# Patient Record
Sex: Female | Born: 1970 | Race: Black or African American | Hispanic: No | Marital: Single | Smoking: Current every day smoker
Health system: Southern US, Community
[De-identification: ages and names within clinical notes are randomized; demographics above are authoritative.]

## PROBLEM LIST (undated history)

## (undated) ENCOUNTER — Ambulatory Visit: Admission: EM | Payer: BLUE CROSS/BLUE SHIELD | Source: Home / Self Care

## (undated) DIAGNOSIS — D649 Anemia, unspecified: Secondary | ICD-10-CM

## (undated) DIAGNOSIS — G43909 Migraine, unspecified, not intractable, without status migrainosus: Secondary | ICD-10-CM

## (undated) DIAGNOSIS — Z720 Tobacco use: Secondary | ICD-10-CM

## (undated) DIAGNOSIS — Z8619 Personal history of other infectious and parasitic diseases: Secondary | ICD-10-CM

## (undated) DIAGNOSIS — Z5189 Encounter for other specified aftercare: Secondary | ICD-10-CM

## (undated) DIAGNOSIS — F329 Major depressive disorder, single episode, unspecified: Secondary | ICD-10-CM

## (undated) DIAGNOSIS — K635 Polyp of colon: Secondary | ICD-10-CM

## (undated) DIAGNOSIS — E559 Vitamin D deficiency, unspecified: Secondary | ICD-10-CM

## (undated) DIAGNOSIS — F32A Depression, unspecified: Secondary | ICD-10-CM

## (undated) DIAGNOSIS — M5430 Sciatica, unspecified side: Secondary | ICD-10-CM

## (undated) DIAGNOSIS — R7989 Other specified abnormal findings of blood chemistry: Secondary | ICD-10-CM

## (undated) DIAGNOSIS — R911 Solitary pulmonary nodule: Secondary | ICD-10-CM

## (undated) DIAGNOSIS — I10 Essential (primary) hypertension: Secondary | ICD-10-CM

## (undated) DIAGNOSIS — E785 Hyperlipidemia, unspecified: Secondary | ICD-10-CM

## (undated) DIAGNOSIS — I639 Cerebral infarction, unspecified: Secondary | ICD-10-CM

## (undated) HISTORY — DX: Vitamin D deficiency, unspecified: E55.9

## (undated) HISTORY — DX: Other specified abnormal findings of blood chemistry: R79.89

## (undated) HISTORY — DX: Cerebral infarction, unspecified: I63.9

## (undated) HISTORY — DX: Solitary pulmonary nodule: R91.1

## (undated) HISTORY — PX: FOOT SURGERY: SHX648

## (undated) HISTORY — DX: Polyp of colon: K63.5

## (undated) HISTORY — DX: Encounter for other specified aftercare: Z51.89

## (undated) HISTORY — PX: ABDOMINAL HYSTERECTOMY: SHX81

## (undated) HISTORY — DX: Anemia, unspecified: D64.9

## (undated) HISTORY — PX: OTHER SURGICAL HISTORY: SHX169

## (undated) HISTORY — DX: Migraine, unspecified, not intractable, without status migrainosus: G43.909

## (undated) HISTORY — DX: Tobacco use: Z72.0

## (undated) HISTORY — PX: ARM NEUROPLASTY: SHX1184

## (undated) HISTORY — DX: Hyperlipidemia, unspecified: E78.5

---

## 1997-07-29 ENCOUNTER — Emergency Department (HOSPITAL_COMMUNITY): Admission: EM | Admit: 1997-07-29 | Discharge: 1997-07-29 | Payer: Self-pay | Admitting: Emergency Medicine

## 1997-09-08 ENCOUNTER — Emergency Department (HOSPITAL_COMMUNITY): Admission: EM | Admit: 1997-09-08 | Discharge: 1997-09-08 | Payer: Self-pay | Admitting: Emergency Medicine

## 1997-09-30 ENCOUNTER — Emergency Department (HOSPITAL_COMMUNITY): Admission: EM | Admit: 1997-09-30 | Discharge: 1997-09-30 | Payer: Self-pay | Admitting: Emergency Medicine

## 1998-01-01 ENCOUNTER — Emergency Department (HOSPITAL_COMMUNITY): Admission: EM | Admit: 1998-01-01 | Discharge: 1998-01-01 | Payer: Self-pay | Admitting: *Deleted

## 1998-06-11 ENCOUNTER — Emergency Department (HOSPITAL_COMMUNITY): Admission: EM | Admit: 1998-06-11 | Discharge: 1998-06-11 | Payer: Self-pay | Admitting: *Deleted

## 1998-06-18 ENCOUNTER — Emergency Department (HOSPITAL_COMMUNITY): Admission: EM | Admit: 1998-06-18 | Discharge: 1998-06-18 | Payer: Self-pay | Admitting: Emergency Medicine

## 1999-02-10 ENCOUNTER — Emergency Department (HOSPITAL_COMMUNITY): Admission: EM | Admit: 1999-02-10 | Discharge: 1999-02-10 | Payer: Self-pay | Admitting: Emergency Medicine

## 1999-02-10 ENCOUNTER — Encounter: Payer: Self-pay | Admitting: Emergency Medicine

## 1999-04-20 ENCOUNTER — Inpatient Hospital Stay (HOSPITAL_COMMUNITY): Admission: AD | Admit: 1999-04-20 | Discharge: 1999-04-20 | Payer: Self-pay | Admitting: *Deleted

## 1999-04-20 ENCOUNTER — Emergency Department (HOSPITAL_COMMUNITY): Admission: EM | Admit: 1999-04-20 | Discharge: 1999-04-20 | Payer: Self-pay | Admitting: *Deleted

## 1999-04-22 ENCOUNTER — Inpatient Hospital Stay (HOSPITAL_COMMUNITY): Admission: AD | Admit: 1999-04-22 | Discharge: 1999-04-22 | Payer: Self-pay | Admitting: Obstetrics & Gynecology

## 1999-07-08 ENCOUNTER — Inpatient Hospital Stay (HOSPITAL_COMMUNITY): Admission: AD | Admit: 1999-07-08 | Discharge: 1999-07-12 | Payer: Self-pay | Admitting: Obstetrics & Gynecology

## 1999-07-08 ENCOUNTER — Encounter: Payer: Self-pay | Admitting: Obstetrics & Gynecology

## 1999-10-01 ENCOUNTER — Emergency Department (HOSPITAL_COMMUNITY): Admission: EM | Admit: 1999-10-01 | Discharge: 1999-10-01 | Payer: Self-pay | Admitting: Emergency Medicine

## 2000-04-03 ENCOUNTER — Emergency Department (HOSPITAL_COMMUNITY): Admission: EM | Admit: 2000-04-03 | Discharge: 2000-04-03 | Payer: Self-pay | Admitting: Emergency Medicine

## 2000-05-22 ENCOUNTER — Emergency Department (HOSPITAL_COMMUNITY): Admission: EM | Admit: 2000-05-22 | Discharge: 2000-05-22 | Payer: Self-pay | Admitting: Emergency Medicine

## 2000-09-02 ENCOUNTER — Emergency Department (HOSPITAL_COMMUNITY): Admission: EM | Admit: 2000-09-02 | Discharge: 2000-09-02 | Payer: Self-pay

## 2001-03-12 ENCOUNTER — Encounter: Admission: RE | Admit: 2001-03-12 | Discharge: 2001-03-12 | Payer: Self-pay

## 2001-04-05 ENCOUNTER — Encounter: Admission: RE | Admit: 2001-04-05 | Discharge: 2001-04-05 | Payer: Self-pay | Admitting: *Deleted

## 2001-04-24 ENCOUNTER — Ambulatory Visit (HOSPITAL_COMMUNITY): Admission: RE | Admit: 2001-04-24 | Discharge: 2001-04-24 | Payer: Self-pay | Admitting: *Deleted

## 2001-05-31 ENCOUNTER — Encounter: Admission: RE | Admit: 2001-05-31 | Discharge: 2001-05-31 | Payer: Self-pay | Admitting: *Deleted

## 2001-07-02 ENCOUNTER — Encounter (INDEPENDENT_AMBULATORY_CARE_PROVIDER_SITE_OTHER): Payer: Self-pay | Admitting: Specialist

## 2001-07-02 ENCOUNTER — Ambulatory Visit: Admission: RE | Admit: 2001-07-02 | Discharge: 2001-07-02 | Payer: Self-pay | Admitting: Obstetrics and Gynecology

## 2001-07-03 ENCOUNTER — Inpatient Hospital Stay (HOSPITAL_COMMUNITY): Admission: AD | Admit: 2001-07-03 | Discharge: 2001-07-03 | Payer: Self-pay | Admitting: *Deleted

## 2001-07-04 ENCOUNTER — Inpatient Hospital Stay (HOSPITAL_COMMUNITY): Admission: AD | Admit: 2001-07-04 | Discharge: 2001-07-04 | Payer: Self-pay | Admitting: Obstetrics and Gynecology

## 2001-07-10 ENCOUNTER — Inpatient Hospital Stay (HOSPITAL_COMMUNITY): Admission: AD | Admit: 2001-07-10 | Discharge: 2001-07-10 | Payer: Self-pay | Admitting: *Deleted

## 2001-07-17 ENCOUNTER — Encounter: Admission: RE | Admit: 2001-07-17 | Discharge: 2001-07-17 | Payer: Self-pay | Admitting: *Deleted

## 2001-09-06 ENCOUNTER — Emergency Department (HOSPITAL_COMMUNITY): Admission: EM | Admit: 2001-09-06 | Discharge: 2001-09-07 | Payer: Self-pay | Admitting: Emergency Medicine

## 2002-03-13 ENCOUNTER — Encounter: Admission: RE | Admit: 2002-03-13 | Discharge: 2002-03-13 | Payer: Self-pay | Admitting: Internal Medicine

## 2002-03-20 ENCOUNTER — Emergency Department (HOSPITAL_COMMUNITY): Admission: EM | Admit: 2002-03-20 | Discharge: 2002-03-20 | Payer: Self-pay | Admitting: Emergency Medicine

## 2002-04-05 ENCOUNTER — Encounter: Admission: RE | Admit: 2002-04-05 | Discharge: 2002-04-05 | Payer: Self-pay | Admitting: *Deleted

## 2002-04-09 ENCOUNTER — Ambulatory Visit (HOSPITAL_COMMUNITY): Admission: RE | Admit: 2002-04-09 | Discharge: 2002-04-09 | Payer: Self-pay | Admitting: *Deleted

## 2002-04-15 ENCOUNTER — Encounter: Payer: Self-pay | Admitting: Emergency Medicine

## 2002-04-15 ENCOUNTER — Emergency Department (HOSPITAL_COMMUNITY): Admission: EM | Admit: 2002-04-15 | Discharge: 2002-04-15 | Payer: Self-pay | Admitting: Emergency Medicine

## 2002-06-11 ENCOUNTER — Ambulatory Visit (HOSPITAL_BASED_OUTPATIENT_CLINIC_OR_DEPARTMENT_OTHER): Admission: RE | Admit: 2002-06-11 | Discharge: 2002-06-11 | Payer: Self-pay | Admitting: Orthopedic Surgery

## 2002-07-22 ENCOUNTER — Inpatient Hospital Stay (HOSPITAL_COMMUNITY): Admission: AD | Admit: 2002-07-22 | Discharge: 2002-07-22 | Payer: Self-pay | Admitting: Family Medicine

## 2002-07-23 ENCOUNTER — Emergency Department (HOSPITAL_COMMUNITY): Admission: EM | Admit: 2002-07-23 | Discharge: 2002-07-23 | Payer: Self-pay | Admitting: Emergency Medicine

## 2002-08-28 ENCOUNTER — Emergency Department (HOSPITAL_COMMUNITY): Admission: EM | Admit: 2002-08-28 | Discharge: 2002-08-28 | Payer: Self-pay | Admitting: Emergency Medicine

## 2002-09-04 ENCOUNTER — Encounter: Admission: RE | Admit: 2002-09-04 | Discharge: 2002-09-04 | Payer: Self-pay | Admitting: Internal Medicine

## 2002-09-21 ENCOUNTER — Encounter: Payer: Self-pay | Admitting: Emergency Medicine

## 2002-09-21 ENCOUNTER — Emergency Department (HOSPITAL_COMMUNITY): Admission: EM | Admit: 2002-09-21 | Discharge: 2002-09-21 | Payer: Self-pay | Admitting: Emergency Medicine

## 2002-10-03 ENCOUNTER — Encounter: Admission: RE | Admit: 2002-10-03 | Discharge: 2003-01-01 | Payer: Self-pay | Admitting: Orthopedic Surgery

## 2003-03-01 ENCOUNTER — Emergency Department (HOSPITAL_COMMUNITY): Admission: EM | Admit: 2003-03-01 | Discharge: 2003-03-01 | Payer: Self-pay | Admitting: Emergency Medicine

## 2003-07-05 ENCOUNTER — Emergency Department (HOSPITAL_COMMUNITY): Admission: EM | Admit: 2003-07-05 | Discharge: 2003-07-05 | Payer: Self-pay | Admitting: Emergency Medicine

## 2004-02-25 ENCOUNTER — Emergency Department (HOSPITAL_COMMUNITY): Admission: EM | Admit: 2004-02-25 | Discharge: 2004-02-25 | Payer: Self-pay | Admitting: Family Medicine

## 2004-06-21 ENCOUNTER — Other Ambulatory Visit: Admission: RE | Admit: 2004-06-21 | Discharge: 2004-06-21 | Payer: Self-pay | Admitting: Obstetrics and Gynecology

## 2004-07-16 ENCOUNTER — Other Ambulatory Visit: Admission: RE | Admit: 2004-07-16 | Discharge: 2004-07-16 | Payer: Self-pay | Admitting: Obstetrics and Gynecology

## 2004-09-24 ENCOUNTER — Inpatient Hospital Stay (HOSPITAL_COMMUNITY): Admission: RE | Admit: 2004-09-24 | Discharge: 2004-09-26 | Payer: Self-pay | Admitting: Obstetrics and Gynecology

## 2004-09-24 ENCOUNTER — Encounter (INDEPENDENT_AMBULATORY_CARE_PROVIDER_SITE_OTHER): Payer: Self-pay | Admitting: Specialist

## 2005-07-11 ENCOUNTER — Emergency Department (HOSPITAL_COMMUNITY): Admission: EM | Admit: 2005-07-11 | Discharge: 2005-07-11 | Payer: Self-pay | Admitting: Emergency Medicine

## 2005-11-03 ENCOUNTER — Emergency Department (HOSPITAL_COMMUNITY): Admission: EM | Admit: 2005-11-03 | Discharge: 2005-11-03 | Payer: Self-pay | Admitting: Family Medicine

## 2006-05-10 ENCOUNTER — Emergency Department (HOSPITAL_COMMUNITY): Admission: EM | Admit: 2006-05-10 | Discharge: 2006-05-10 | Payer: Self-pay | Admitting: Emergency Medicine

## 2006-12-12 ENCOUNTER — Encounter: Admission: RE | Admit: 2006-12-12 | Discharge: 2006-12-12 | Payer: Self-pay | Admitting: Nephrology

## 2007-02-17 ENCOUNTER — Emergency Department (HOSPITAL_COMMUNITY): Admission: EM | Admit: 2007-02-17 | Discharge: 2007-02-17 | Payer: Self-pay | Admitting: Family Medicine

## 2007-02-22 ENCOUNTER — Emergency Department (HOSPITAL_COMMUNITY): Admission: EM | Admit: 2007-02-22 | Discharge: 2007-02-22 | Payer: Self-pay | Admitting: Family Medicine

## 2007-04-03 ENCOUNTER — Emergency Department (HOSPITAL_COMMUNITY): Admission: EM | Admit: 2007-04-03 | Discharge: 2007-04-03 | Payer: Self-pay | Admitting: Emergency Medicine

## 2008-01-29 ENCOUNTER — Emergency Department (HOSPITAL_COMMUNITY): Admission: EM | Admit: 2008-01-29 | Discharge: 2008-01-29 | Payer: Self-pay | Admitting: Emergency Medicine

## 2008-01-31 ENCOUNTER — Inpatient Hospital Stay (HOSPITAL_COMMUNITY): Admission: EM | Admit: 2008-01-31 | Discharge: 2008-02-01 | Payer: Self-pay | Admitting: Emergency Medicine

## 2008-04-22 ENCOUNTER — Emergency Department (HOSPITAL_COMMUNITY): Admission: EM | Admit: 2008-04-22 | Discharge: 2008-04-22 | Payer: Self-pay | Admitting: Emergency Medicine

## 2009-05-24 ENCOUNTER — Emergency Department (HOSPITAL_COMMUNITY): Admission: EM | Admit: 2009-05-24 | Discharge: 2009-05-24 | Payer: Self-pay | Admitting: Internal Medicine

## 2009-05-26 ENCOUNTER — Emergency Department (HOSPITAL_COMMUNITY): Admission: EM | Admit: 2009-05-26 | Discharge: 2009-05-26 | Payer: Self-pay | Admitting: Emergency Medicine

## 2009-07-25 ENCOUNTER — Emergency Department (HOSPITAL_COMMUNITY): Admission: EM | Admit: 2009-07-25 | Discharge: 2009-07-26 | Payer: Self-pay | Admitting: Emergency Medicine

## 2010-02-20 ENCOUNTER — Emergency Department (HOSPITAL_COMMUNITY)
Admission: EM | Admit: 2010-02-20 | Discharge: 2010-02-20 | Payer: Self-pay | Source: Home / Self Care | Admitting: Emergency Medicine

## 2010-05-03 LAB — POCT CARDIAC MARKERS: Troponin i, poc: <0.05 ng/mL (ref 0.00–0.09)

## 2010-05-03 LAB — CBC
MCH: 28.3 pg (ref 26.0–34.0)
MCHC: 33 g/dL (ref 30.0–36.0)
Platelets: 268 10*3/uL (ref 150–400)
RBC: 4.34 MIL/uL (ref 3.87–5.11)

## 2010-05-03 LAB — URINALYSIS, ROUTINE W REFLEX MICROSCOPIC
Ketones, ur: NEGATIVE mg/dL
Nitrite: NEGATIVE
Protein, ur: NEGATIVE mg/dL

## 2010-05-03 LAB — DIFFERENTIAL
Basophils Relative: 0 % (ref 0–1)
Eosinophils Absolute: 0 10*3/uL (ref 0.0–0.7)
Lymphs Abs: 1.3 10*3/uL (ref 0.7–4.0)
Neutrophils Relative %: 78 % — ABNORMAL HIGH (ref 43–77)

## 2010-05-03 LAB — POCT I-STAT, CHEM 8
BUN: 4 mg/dL — ABNORMAL LOW (ref 6–23)
Calcium, Ion: 1.11 mmol/L — ABNORMAL LOW (ref 1.12–1.32)
Chloride: 104 mEq/L (ref 96–112)
Glucose, Bld: 95 mg/dL (ref 70–99)

## 2010-05-03 LAB — D-DIMER, QUANTITATIVE: D-Dimer, Quant: 0.6 ug/mL-FEU — ABNORMAL HIGH (ref 0.00–0.48)

## 2010-05-10 LAB — URINE CULTURE: Colony Count: >=100000

## 2010-05-10 LAB — URINALYSIS, ROUTINE W REFLEX MICROSCOPIC
Bilirubin Urine: NEGATIVE
Nitrite: NEGATIVE
Protein, ur: NEGATIVE mg/dL
Specific Gravity, Urine: 1.011 (ref 1.005–1.030)
Urobilinogen, UA: 0.2 mg/dL (ref 0.0–1.0)

## 2010-05-10 LAB — PREGNANCY, URINE: Preg Test, Ur: NEGATIVE

## 2010-05-10 LAB — URINE MICROSCOPIC-ADD ON

## 2010-07-06 NOTE — H&P (Signed)
Rachel Fischer, Rachel Fischer               ACCOUNT NO.:  [id number]   MEDICAL RECORD NO.:  [id number]          PATIENT TYPE:  INP   LOCATION:  5120                         FACILITY:  [facility]   PHYSICIAN:  Fleet Contras, M.D.    DATE OF BIRTH:  17-Dec-1970   DATE OF ADMISSION:  01/31/2008  DATE OF DISCHARGE:                              HISTORY & PHYSICAL   PRESENTING COMPLAINT:  Sore throat, difficulty in swallowing.   HISTORY OF PRESENT ILLNESS:  Rachel Fischer is a 40 year old African  American lady with past medical history significant for migraine  headaches, chronic anemia, and allergic rhinitis.  She came to the  emergency room at [facility] with 4 days' history of  progressively worsening soreness in throat and increasing difficulty  with swallowing.  She had been seen in the emergency room about 2 days  earlier and treated with pain medications and antibiotics, but her  symptoms did not improve and she has continued to have high fevers up to  103, and had trouble to swallow her medications and food and drinks.  On  evaluation at this time, her tonsils were swollen bilaterally with  erythema and exudates.  Monospot screen was negative.  CT scan of the  neck was performed and showed mild peritonsillar swelling without any  abscess collection.  Due to her increasing difficulty with swallowing  and pain, she has been admitted to the hospital for IV antibiotics, IV  steroids, and close monitoring.   PAST MEDICAL HISTORY:  1. Migraine headaches.  2. Anemia.  3. Allergic rhinitis.   MEDICATION HISTORY:  She is on Topamax and baclofen.   She is allergic to PENICILLIN and MORPHINE, which gives her hives.   PAST SURGICAL HISTORY:  Hysterectomy for uterine fibroids.   FAMILY AND SOCIAL HISTORY:  She smokes about 3 cigarettes per day.  She  does not use any alcohol or tobacco or illicit drugs.  Her father is  alive with hypertension.  Mother is alive with breast cancer and  hypertension.  She has 2 brothers and 5 sisters, all alive and well.  She has 4 children, 3 boys, 1 girl all alive and well.   REVIEW OF SYSTEMS:  She has had fevers and chills.  She denies any chest  pain, shortness of breath, cough, or sputum production.  She has no  nausea, vomiting, diarrhea, or constipation.  She has no urinary  symptoms.   PHYSICAL EXAMINATION:  GENERAL:  She is lying in the hospital bed in  moderate-to-severe painful distress.  She is not pale.  She is not  icteric.  She is not cyanosed.  She is well hydrated.  VITAL SIGNS:  Her initial vital signs showed a blood pressure of 133/82,  heart rate of 87, respiratory rate of 16, temperature of 98.5, O2 sats  on room air is 99%.  HEENT:  Pupils are equal and reactive to light and accommodation.  Oropharyngeal exam shows marked swelling, erythema, and exudative  tonsils and surrounding oropharynx with some pooling of saliva.  NECK:  Supple with some anterior cervical lymphadenopathy with  tenderness.  There is no elevated JVD or carotid bruit.  CHEST:  Normal with no acute rales or rhonchi, no wheezes.  ABDOMEN:  Soft, nontender.  No masses.  Bowel sounds are present.  EXTREMITIES:  Shows no edema, calf tenderness, or swelling.  CNS:  She is alert and oriented x3 with no focal neurological deficits.   LABORATORY DATA:  Her white count is 12.7.  CT scan of the neck is  reported as showing tonsillar swelling with no discrete abscess  collection.   ASSESSMENT:  Rachel Fischer is a 40 year old African American lady with  history of migraine headaches being admitted to the hospital because of  much sore throat with difficulty in swallowing.   ADMISSION DIAGNOSES:  1. Severe triangle tonsillitis.  2. Severe dysphagia.   PLAN OF CARE:  She is admitted to medical bed.  She will be on IV  clindamycin 600 mg q.8 h., IV Solu-Medrol 50 mg q.8 h., IV Dilaudid 1-2  mg q.4 h. p.r.n., IV Zofran 4 mg q.4 h., viscous lidocaine 3 mL  Swish  and Swallow t.i.d. a.c.  CBC with study in a.m.  This plan of care has  been discussed with her and questions answered.      Fleet Contras, M.D.  Electronically Signed     EA/MEDQ  D:  02/01/2008  T:  02/01/2008  Job:  [id number]
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
## 2010-07-09 NOTE — Discharge Summary (Signed)
NAMEMarland Kitchen  Rachel, Fischer               ACCOUNT NO.:  [id number]   MEDICAL RECORD NO.:  [id number]          PATIENT TYPE:  INP   LOCATION:  9307                          FACILITY:  [facility]   PHYSICIAN:  Osborn Coho, M.D.   DATE OF BIRTH:  05-17-1970   DATE OF ADMISSION:  09/24/2004  DATE OF DISCHARGE:  09/26/2004                                 DISCHARGE SUMMARY   DISCHARGE DIAGNOSIS:  Dysmenorrhea, menorrhagia, pelvic adhesions and  anemia.   OPERATION:  On the day of admission the patient underwent a total abdominal  hysterectomy with lysis of adhesions and tolerated procedure well. The  patient was found to have a normal-appearing uterus and cervix as well as  right ovary and the previously lysed right tube. The patient's left tube and  ovary were surgically absent.   HISTORY OF PRESENT ILLNESS:  Rachel Fischer is a 40 year old female para 4-0-0-4  who is status post bilateral tubal ligation and left salpingo-oophorectomy,  who presents for hysterectomy because of severe dysmenorrhea, menorrhagia  and subsequent anemia. Please see the patient's dictated history physical  examination for details.   PHYSICAL EXAMINATION:  GENERAL:  Exam was within normal limits.  PELVIC:  External genitalia within normal limits. Cervix without cervical  motion tenderness. Uterus was normal size and nontender and there were no  palpable adnexal masses.   HOSPITAL COURSE:  On the day of admission the patient underwent  aforementioned procedures and tolerated well. Postoperative course was  unremarkable with the patient tolerating the postop hemoglobin of 9.6 (preop  hemoglobin 12.2). By postop day #2 the patient had resumed bowel and bladder  function and was therefore deemed ready for discharge home.   DISCHARGE MEDICATIONS:  1.  Motrin 600 milligrams 1 tablet every 6 hours as needed for pain  2.  Vicodin 1-2 tablets every 4-6 hours as needed for breakthrough pain  3.  Ferrous sulfate 325 milligrams 1  tablet daily  4.  Colace 100 milligrams 1 tablet twice daily.   FOLLOW UP:  The patient is to keep her 6 weeks postoperative visit with Dr.  Su Hilt at [facility] as scheduled.   DISCHARGE INSTRUCTIONS:  The patient was advised to call the doctor with a  temperature greater than or equal to 100.4, fever, chills, vomiting or  severe abdominal pain. The patient was discharged with her staples in place.  She is to call [facility] to schedule an appointment on September 30, 2004, for staple removal.  She was further advised to avoid driving for 2 weeks, heavy lifting for 6  weeks, and intercourse for 6 weeks and to increase activities slowly. The  patient's diet was without restriction and final pathology was not available  at the time of this dictation.      [place] J. Adline Peals.      Osborn Coho, M.D.  Electronically Signed    EJP/MEDQ  D:  10/28/2004  T:  10/28/2004  Job:  [id number]

## 2010-07-09 NOTE — H&P (Signed)
NAME:  Rachel Fischer, Rachel Fischer                         ACCOUNT NO.:  [id number]   MEDICAL RECORD NO.:  [id number]                   PATIENT TYPE:  AMB   LOCATION:  DSC                                  FACILITY:  [facility]   PHYSICIAN:  Robert A. Thurston Hole, M.D.              DATE OF BIRTH:  25-Oct-1970   DATE OF ADMISSION:  06/11/2002  DATE OF DISCHARGE:                                HISTORY & PHYSICAL   PREOPERATIVE DIAGNOSIS:  Left mid foot dorsal ganglion cyst..   POSTOPERATIVE DIAGNOSIS:  Left mid foot dorsal ganglion cyst.   OPERATION:  Left mid foot dorsal cyst excision.   SURGEON:  Elana Alm. Thurston Hole, M.D.   ASSISTANT:  Gifford Shave, P. A.   ANESTHESIA:  Ankle block.   OPERATIVE TIME:  40 minutes.   COMPLICATIONS:  None.   INDICATIONS FOR PROCEDURE:  The patient is a 40 year old who has had  significant left mid foot pain for the past 8 to 10 months, increasing in  nature with a dorsal ganglion cyst.  This has failed aspiration and  conservative care and is now to undergo cyst excision.   DESCRIPTION OF PROCEDURE:  The patient was brought to the operating room on  06/11/2002 after a block had been placed in the holding room.  She was placed  on the operating room table in a supine position.  Her right foot and leg  was prepped using sterile Duraprep and draped using sterile technique.  The  leg was exsanguinated, and a calf tourniquet was elevated to 300 mmHg.  Initially through a 3 cm dorsal longitudinal incision based over the cyst,  initial exposure was made.  The underlying subcutaneous tissues were incised  with line with skin incision.  The neurovascular bundle was carefully  protected while the cyst was exposed.  It was filled with a gelatinous  material and was excised including its stalk which was contiguous with the  underlying navicular cuneiform joint.  Cyst measured 1.5 x 1 cm.  It was not  sent for pathologic review due to the obvious  benign nature of it.  After  this was excised, the wound was irrigated and then closed using 2-0 Vicryl  and 3-0 Prolene.  Sterile dressings were applied.  Sterile dressings were  applied.  Tourniquet was released.  The patient was awakened and taken to  the recovery room in stable condition.   FOLLOWUP CARE:  The patient will be followed as an outpatient on Vicodin  and Naprosyn.  She will be back in the office in a week for wound check and  followup.                                                Robert A. Thurston Hole, M.D.   RAW/MEDQ  D:  06/11/2002  T:  06/11/2002  Job:  [id number]

## 2010-07-09 NOTE — Op Note (Signed)
[facility]  Patient:    Rachel Fischer, Rachel Fischer Visit Number: [id number] MRN: [id number]          Service Type: GYN Location: [facility] Attending Physician:  Michaelle Copas Dictated by:   Elinor Dodge, M.D. Proc. Date: 07/02/01 Admit Date:  07/03/2001                             Operative Report  PREOPERATIVE DIAGNOSES:       1. Exquisite left ovarian pain.                               2. Small ovarian cyst, chronic.                               3. Intractible menometrorrhagia.  POSTOPERATIVE DIAGNOSES:      1. Exquisite left ovarian pain.                               2. Small ovarian cyst, chronic.                               3. Intractible menometrorrhagia.                               4. Left ovarian cyst.                               5. Endometrial polyps.  PROCEDURES:                   1. Exploratory laparotomy.                               2. Left salpingo-oophorectomy.                               3. Hysteroscopic examination.                               4. Dilation and curettage.  SURGEON:                      Elinor Dodge, M.D.  DESCRIPTION OF PROCEDURE:     Under satisfactory general anesthesia with the patient in the dorsal lithotomy position, the perinneum, vagina and abdomen were prepped and draped in the usual sterile manner.  Bimanual pelvic examination under anesthesia revealed the uterus to be normal size, shape and consistency, anteflexed and freely movable with normal free adnexa with the exception of the left ovary, which was slightly enlarged and somewhat cystic. A weighted speculum was placed in the posterior fourchette of the vagina.  The anterior lip of the cervix was grasped with a single-tooth tenaculum.  A acorn cannula was placed in the cervix and attached to the tenaculum for mobilization of the uterus.  A Veress needle was inserted into the peritoneal cavity at the lower pole of the umbilcus Dictated by:    Elinor Dodge,  M.D. Attending Physician:  Michaelle Copas DD:  07/02/01 TD:  07/03/01 Job: [id number]

## 2010-07-09 NOTE — H&P (Signed)
NAME:  Rachel Fischer, Rachel Fischer               ACCOUNT NO.:  [id number]   MEDICAL RECORD NO.:  [id number]          PATIENT TYPE:  INP   LOCATION:  NA                            FACILITY[facility]  [facility]   PHYSICIAN:  Osborn Coho, M.D.   DATE OF BIRTH:  [birth year]   DATE OF ADMISSION:  DATE OF DISCHARGE:                                HISTORY & PHYSICAL   CHIEF COMPLAINT:  Heavy and painful periods.   HISTORY:  Rachel Fischer is a 40 year old gravida 4, para 4, who has been on  Lupron Depot since August 05, 2004 started after patient presented with  complaints of heavy menstrual cycles with severe pain and anemia.   PAST OBSTETRICAL HISTORY:  C-sections x4, all full-term.   PAST GYNECOLOGIC HISTORY:  History of regular menses.  Diagnosed with  fibroids about three to four years ago with menorrhagia for approximately  one year and worsening dysmenorrhea.  Trichomonas about two years ago,  treated.  The patient denies a history of gonorrhea or Chlamydia.  She  reports yeast infections after having antibiotics.  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   PAST MEDICAL HISTORY:  Denies.   PAST SURGICAL HISTORY:  C-sections x4, LSO, laparoscopy x2, and multiple  foot surgeries.  BTL with her last C-section.   MEDICATIONS:  None.   ALLERGIES:  PENICILLIN, hives; and MORPHINE.   SOCIAL HISTORY:  Cigarette smoker, half-pack every two days.  Has a beer  daily.  Denies illicit drug use.   FAMILY HISTORY:  Hypertension and diabetes on maternal side.   REVIEW OF SYSTEMS:  Denies chest pain or shortness of breath with activity.   REVIEW OF SYSTEMS:  Denies fevers, chills, nausea, vomiting, diarrhea, GU or  GI problems.  Does report a history of recurrent urinary tract infections  and migraines.   PHYSICAL EXAMINATION:  HEENT:  Within normal limits.  NECK:  Thyroid not enlarged.  CARDIAC:  Heart rate and rhythm are regular.  CHEST:  Clear to auscultation bilaterally.  BREASTS:  No masses, discharge, thickenings or nipple retractions.  BACK:  No CVA tenderness.  ABDOMEN:  Soft and nontender.  EXTREMITIES:  Within normal limits.  PELVIC:  External genitalia within normal limits.  No CMT.  Normal-size  uterus and nontender with no palpable adnexal masses.   IMAGING AND LABS:  Pap smear with biopsies as noted above.  Ultrasound on  July 27, 2004, showed a uterus measuring 10.8 x 5.6 x 6.9 cm with fibroids  and a complex cyst on the right ovary  measuring 3.6 cm.  Repeat ultrasound  on September 08, 2004, was negative for an ovarian cyst.  HPV testing on May 26  was negative for high-risk HPV.  CBC on May 26 was noted for anemia with a  hemoglobin of 8.7 and a hematocrit of 28.1.  Thyroid hormone was within  normal limits as well as prolactin hormone.   ASSESSMENT AND PLAN:  Rachel Fischer is a 40 year old para 4  with menorrhagia  and severe dysmenorrhea with fibroids.  She has a history of anemia  secondary to menorrhagia and has been on Lupron Depot since August 05, 2004,  and will have preoperative laboratories prior to procedure.  She has also  been taking iron or was instructed to take iron twice a day.  The options  were reviewed with Rachel Fischer, and she wanted definitive management with a  hysterectomy.  Her options included hormonal management versus minor  surgical procedures, definitive management with a hysterectomy, and  observation if she had decided upon that, although not recommended.  The  risks, benefits, and alternatives of a hysterectomy were discussed at length  with the patient, including but not limited to the risks of bleeding,  infection and injury to surrounding organs.  The patient verbalized  understanding and an office consent was signed and witnessed.  A hospital  consent will be signed and witnessed prior to procedure.       AR/MEDQ  D:   09/24/2004  T:  09/24/2004  Job:  [id number]

## 2010-07-09 NOTE — Op Note (Signed)
[facility] of [facility]  Patient:    Rachel Fischer, Rachel Fischer Visit Number: [id number] MRN: [id number]          Service Type: GYN Location: [facility] Attending Physician:  Michaelle Copas Dictated by:   Kristen Loader, M.D. Proc. Date: 07/02/01 Admit Date:  07/03/2001                             Operative Report  PREOPERATIVE DIAGNOSES:       1. Exquisite left ovarian pain.                               2. Small ovarian cyst, chronic.                               3. Retractable menometrorrhagia.  POSTOPERATIVE DIAGNOSES:      1. Exquisite left ovarian pain.                               2. Small ovarian cyst, chronic.                               3. Retractable menometrorrhagia.                               4. Left ovarian cyst.                               5. Endometrial polyps.  OPERATION:                    1. Laparoscopic examination.                               2. Exploratory laparotomy.                               3. Left salpingo-oophorectomy.                               4. Hysteroscopic examination.                               5. Dilatation and curettage.  SURGEON:                      Kristen Loader, M.D.  ANESTHESIA:                   General.  DESCRIPTION OF PROCEDURE:     Under satisfactory general anesthesia with the patient in the dorsolithotomy position, the perineum, vagina, and abdomen were prepped and draped in the usual sterile manner.  Bimanual pelvic examination under anesthesia revealed the uterus was normal size, shape, and consistency, anteflexed, freely moveable with normal and free adnexa with the exception of the left ovary which was slightly enlarged and somewhat cystic.  A weighted speculum was placed in the posterior fourchette of the vagina.  The  anterior lip of the cervix was grasped with a single tooth tenaculum.  An acorn cannula was placed in the cervix, attached to the tenaculum for mobilization of the uterus.  A  Veress needle was inserted into the peritoneal cavity at the lower pole of the umbilicus, and then slow insufflation with carbon dioxide was carried out until equal tympany occurred over the entire abdominal wall.  A 1 cm transverse incision was made just below the umbilicus and laparoscopic trocar inserted into the peritoneal cavity.  The trocar removed and the laparoscope inserted.  Pelvic organs easily visualized.  There was a large omental adhesion that went up to the anterior abdominal wall to the left of the opening at the umbilicus, but we were able to visualize the uterus and ovaries completely.  The right side was normal.  The uterus was normal size, shape, and consistency, and appeared normal.  The left ovary had a hemorrhagic cyst on it, and because of the chronic nature of the pain, it was decided to remove this.  The laparoscopic trocar was left in place, and a transverse Pfannenstiel incision made, and extended for a total length of 7 cm.  The peritoneal cavity was entered by this way and by manipulating the uterus.  The left ovary was brought up into the operative field.  Clamps were placed across as well as beneath the ovary and tube, and the ovary and tube were removed, and the pedicle ligated with #1 chromic catgut suture ligatures.  Attention was directed to the right ovary which was examined and found to be within normal limits.  No other abnormalities were noted.  The fascia was then closed with a continuous running locked 0 Vicryl on an atraumatic needle.  The skin edges approximated with a Dermabond adhesive.  The scope was then removed from the umbilicus and the fascia closed with continuous running 3-0 Vicryl on an atraumatic needle which ran up and was run as a subcuticular stitch to close the skin edges.  A dry sterile dressing was applied.  Attention was then directed below where a weighted speculum was placed in the vagina.  The tenaculum and acorn cannula  removed from the vagina.  The cervix was sounded to a depth of 10 cm and the cervical os dilated to a #8 Hegar dilator.  A 12 cm hysteroscope was used to visualize the uterine cavity from the fundus to just above the internal os it was normal, but around the internal os, there were many polypoid structures, and a somewhat thickened endometrium.  The uterine cavity was then curetted with a small sharp curet very vigorously and explored with the polyp forceps, and a large amount of endometrial tissue was obtained and sent for pathological diagnosis.  The tenaculum and speculum were removed from the vagina.  The patient was transferred to the recovery room in satisfactory condition having tolerated the procedure well with a 40 cc blood loss.  Tape, instrument, sponge, and needle count were reported correct at the end of the procedure. Dictated by:   Kristen Loader, M.D. Attending Physician:  Michaelle Copas DD:  07/02/01 TD:  07/04/01 Job: [id number]

## 2010-07-09 NOTE — Op Note (Signed)
NAMEMarland Kitchen  Rachel Fischer, Rachel Fischer               ACCOUNT NO.:  [id number]   MEDICAL RECORD NO.:  [id number]          PATIENT TYPE:  INP   LOCATION:  9307                          FACILITY:  [facility]   PHYSICIAN:  Osborn Coho, M.D.   DATE OF BIRTH:  15-Oct-1970   DATE OF PROCEDURE:  09/24/2004  DATE OF DISCHARGE:  09/26/2004                                 OPERATIVE REPORT   PREOPERATIVE DIAGNOSES:  1.  Menorrhagia.  2.  Dysmenorrhea.  3.  Symptomatic fibroids.   POSTOPERATIVE DIAGNOSES:  1.  Menorrhagia.  2.  Dysmenorrhea.   PROCEDURE:  Abdominal hysterectomy.   ANESTHESIA:  General.   ATTENDING:  Osborn Coho, M.D.   ASSISTANT:  Naima A. Dillard, M.D.   FLUIDS:  2000 mL.   URINE OUTPUT:  850 mL.   ESTIMATED BLOOD LOSS:  300 mL.   SPECIMENS TO PATHOLOGY:  Uterus and cervix.   COMPLICATIONS:  None.   FINDINGS:  Small uterus, no obvious fibroids noted, and multiple adhesions  of omentum to the anterior abdominal wall.   PROCEDURE:  The patient was taken to the operating room after the risks,  benefits, and alternatives were reviewed with the patient.  The patient  verbalized understanding and consent signed and witnessed.  The patient was  placed under general anesthesia and prepped and draped in the normal sterile  fashion in the dorsal supine position with TEDs and SCDs on.  A Pfannenstiel  skin incision was made at the site of the prior scar and carried down to the  underlying layer of fascia with the scalpel.  The fascia was elevated with  Kellys and excised with the Mayo scissors.  The muscle was noted to be thin  on the right side and went down to the level of the symphysis pubis.  The  bowel and multiple omental adhesions were noted immediately.  The adhesions  were lysed sharply and two areas in particular had to be clamped with  Kellys, excised and ligated with 0 plain ties.  The fascia was taken down on  the left side as well after elevating it with Kellys and excising  it with  the Mayo scissors.  Kocher clamps were placed on the superior aspect of the  fascial incision and for only about a centimeter, the rectus muscle was  excised from the fascia.  The same was done on the inferior aspect of the  fascial incision.  In combination, the peritoneum and muscle were extended  manually.  The bowel was packed away with three laparotomy sponges.  The  O'Connor-O'Sullivan was placed for self-retraction.  The uterus was  identified and elevated and the left ovary and fallopian tube were  surgically absent.  The right ovary and fallopian tube appeared within  normal limits with the exception of tubal ligation was noted of that  fallopian tube as well.  The round ligaments were identified bilaterally,  clamped, cut, and suture ligated using 0 Vicryl.  The anterior sheath of the  broad ligament was excised and bladder flap initiated.  The utero-ovarian  ligaments were identified bilaterally, clamped, cut and  tied with 0 Vicryl  ties and suture ligated using 0 Vicryl as well.  The bladder flap was taken  down sharply and dense adhesions were noted of the bladder to the lower  uterine segment.  The bilateral uterine vessels were skeletonized and  clamped with a curved Heaney, cut and suture ligated with 0 Vicryl.  The  straight Heaney was used on the paracervical tissue, which was sequentially  and bilaterally clamped, cut, and suture ligated, incorporating the cardinal  and uterosacral ligaments.  The last clamp on either side was clamped with a  curved Heaney at the lower level of the cervix, cut and suture ligated using  a fixation stitch.  The uterus and cervix were removed and handed off to be  sent to pathology.  The vaginal cuff was repaired using figure-of-eights of  0 Vicryl.  Copious irrigation of the intra-abdominal cavity was performed  and hemostasis was obtained at the vaginal cuff using another stitch of 0  Vicryl and the Bovie.  All pedicles were noted  to be hemostatic.  All  instruments were removed.  The fascia, peritoneum and muscle were repaired  on the right using 0 Vicryl in a running fashion.  On the left, the fascia  was repaired with 0 Vicryl in a running fashion.  The subcutaneous tissue  was irrigated and made hemostatic with the Bovie.  The skin was  reapproximated with staples.  Sponge, lap and needle count was correct.  The  patient tolerated the procedure well and was transferred to the recovery  room in good condition.      Osborn Coho, M.D.  Electronically Signed     AR/MEDQ  D:  09/27/2004  T:  09/27/2004  Job:  [id number]

## 2010-10-12 ENCOUNTER — Emergency Department (HOSPITAL_COMMUNITY)
Admission: EM | Admit: 2010-10-12 | Discharge: 2010-10-13 | Discharge status: Against Medical Advice | Payer: Self-pay | Attending: Emergency Medicine | Admitting: Emergency Medicine

## 2010-10-12 DIAGNOSIS — M549 Dorsalgia, unspecified: Secondary | ICD-10-CM | POA: Insufficient documentation

## 2010-11-11 LAB — CULTURE, ROUTINE-ABSCESS

## 2010-11-12 LAB — STREP A DNA PROBE: Group A Strep Probe: NEGATIVE

## 2010-11-12 LAB — POCT RAPID STREP A: Streptococcus, Group A Screen (Direct): NEGATIVE

## 2010-11-12 LAB — INFLUENZA A AND B ANTIGEN (CONVERTED LAB)
Inflenza A Ag: NEGATIVE
Influenza B Ag: NEGATIVE

## 2010-11-26 LAB — URINALYSIS, ROUTINE W REFLEX MICROSCOPIC
Leukocytes, UA: NEGATIVE
Nitrite: NEGATIVE
Specific Gravity, Urine: 1.045 — ABNORMAL HIGH (ref 1.005–1.030)
Urobilinogen, UA: 1 mg/dL (ref 0.0–1.0)

## 2010-11-26 LAB — DIFFERENTIAL
Basophils Absolute: 0 10*3/uL (ref 0.0–0.1)
Eosinophils Absolute: 0 10*3/uL (ref 0.0–0.7)
Eosinophils Relative: 0 % (ref 0–5)
Monocytes Absolute: 0.7 10*3/uL (ref 0.1–1.0)

## 2010-11-26 LAB — CBC
HCT: 37.9 % (ref 36.0–46.0)
HCT: 38.2 % (ref 36.0–46.0)
Hemoglobin: 12.6 g/dL (ref 12.0–15.0)
Hemoglobin: 12.8 g/dL (ref 12.0–15.0)
MCHC: 32.9 g/dL (ref 30.0–36.0)
MCHC: 33.8 g/dL (ref 30.0–36.0)
MCV: 86.1 fL (ref 78.0–100.0)
MCV: 88.1 fL (ref 78.0–100.0)
Platelets: 288 10*3/uL (ref 150–400)
RBC: 4.33 MIL/uL (ref 3.87–5.11)
RDW: 13.2 % (ref 11.5–15.5)
RDW: 13.3 % (ref 11.5–15.5)

## 2010-11-26 LAB — BASIC METABOLIC PANEL
BUN: 6 mg/dL (ref 6–23)
CO2: 24 mEq/L (ref 19–32)
Chloride: 99 mEq/L (ref 96–112)
Glucose, Bld: 130 mg/dL — ABNORMAL HIGH (ref 70–99)
Potassium: 3.1 mEq/L — ABNORMAL LOW (ref 3.5–5.1)
Sodium: 131 mEq/L — ABNORMAL LOW (ref 135–145)

## 2010-11-26 LAB — PREGNANCY, URINE: Preg Test, Ur: NEGATIVE

## 2010-11-26 LAB — URINE MICROSCOPIC-ADD ON

## 2011-02-27 ENCOUNTER — Encounter: Payer: Self-pay | Admitting: *Deleted

## 2011-02-27 ENCOUNTER — Emergency Department (HOSPITAL_COMMUNITY)
Admission: EM | Admit: 2011-02-27 | Discharge: 2011-02-27 | Disposition: A | Discharge status: Home / Self Care | Payer: Medicaid Other | Attending: Emergency Medicine | Admitting: Emergency Medicine

## 2011-02-27 DIAGNOSIS — R229 Localized swelling, mass and lump, unspecified: Secondary | ICD-10-CM | POA: Insufficient documentation

## 2011-02-27 DIAGNOSIS — I10 Essential (primary) hypertension: Secondary | ICD-10-CM | POA: Insufficient documentation

## 2011-02-27 DIAGNOSIS — N644 Mastodynia: Secondary | ICD-10-CM | POA: Insufficient documentation

## 2011-02-27 HISTORY — DX: Essential (primary) hypertension: I10

## 2011-02-27 MED ORDER — HYDROCODONE-ACETAMINOPHEN 5-325 MG PO TABS: 2.0000 | ORAL_TABLET | ORAL | Status: AC | PRN

## 2011-02-27 MED ORDER — SULFAMETHOXAZOLE-TRIMETHOPRIM 800-160 MG PO TABS: 1.0000 | ORAL_TABLET | Freq: Two times a day (BID) | ORAL | Status: AC

## 2011-02-27 MED ORDER — OXYCODONE-ACETAMINOPHEN 5-325 MG PO TABS: 2.0000 | ORAL_TABLET | ORAL | Status: DC | PRN

## 2011-02-27 NOTE — ED Notes (Signed)
Reports pain to right breast, knot x 2, first noticed them last night and now havign severe pain.

## 2011-02-27 NOTE — ED Notes (Signed)
MD at bedside. 

## 2011-02-27 NOTE — ED Provider Notes (Signed)
History     CSN: [id number]  Arrival date & time 02/27/11  1258   First MD Initiated Contact with Patient 02/27/11 1317      Chief Complaint  Patient presents with  . Breast Pain    (Consider location/radiation/quality/duration/timing/severity/associated sxs/prior treatment) HPI This 41 year old female noticed last night a painful tender localized nonradiating right breast tissue subcutaneous region to the 7:00 position without change in skin color is no redness no fever no chest pain no cough no shortness of breath no abdominal pain vomiting lightheadedness or other concerns. There's been no trauma. She did not notice any nodules or lumps or masses in the breast before but now feels a painful small lump in the area where she is tender without associated symptoms or radiation. This is a moderately severe pain. There is no treatment prior to arrival. Past Medical History  Diagnosis Date  . Hypertension     Past Surgical History  Procedure Date  . Abdominal hysterectomy     History reviewed. No pertinent family history.  History  Substance Use Topics  . Smoking status: Current Everyday Smoker -- 0.5 packs/day    Types: Cigarettes  . Smokeless tobacco: Not on file  . Alcohol Use: No    OB History    Grav Para Term Preterm Abortions TAB SAB Ect Mult Living                  Review of Systems  Constitutional: Negative for fever.       10 Systems reviewed and are negative for acute change except as noted in the HPI.  HENT: Negative for congestion.   Eyes: Negative for discharge and redness.  Respiratory: Negative for cough and shortness of breath.   Cardiovascular: Negative for chest pain.  Gastrointestinal: Negative for vomiting and abdominal pain.  Musculoskeletal: Negative for back pain.  Skin: Negative for rash.  Neurological: Negative for syncope, numbness and headaches.  Psychiatric/Behavioral:       No behavior change.    Allergies  Morphine and related and  Penicillins  Home Medications   Current Outpatient Rx  Name Route Sig Dispense Refill  . CIPROFLOXACIN HCL 500 MG PO TABS Oral Take 500 mg by mouth 2 (two) times daily.      Marland Kitchen OLMESARTAN MEDOXOMIL-HCTZ 20-12.5 MG PO TABS Oral Take 1 tablet by mouth daily.      Marland Kitchen HYDROCODONE-ACETAMINOPHEN 5-325 MG PO TABS Oral Take 2 tablets by mouth every 4 (four) hours as needed for pain. 20 tablet 0  . SULFAMETHOXAZOLE-TRIMETHOPRIM 800-160 MG PO TABS Oral Take 1 tablet by mouth 2 (two) times daily. One po bid x 7 days 14 tablet 0    BP 128/87  Pulse 84  Temp(Src) 98.1 F (36.7 C) (Oral)  Resp 18  SpO2 100%  Physical Exam  Nursing note and vitals reviewed. Constitutional:       Awake, alert, nontoxic appearance.  HENT:  Head: Atraumatic.  Eyes: Right eye exhibits no discharge. Left eye exhibits no discharge.  Neck: Neck supple.  Cardiovascular: Normal rate and regular rhythm.   No murmur heard. Pulmonary/Chest: Effort normal. No respiratory distress. She has no wheezes. She has no rales. She exhibits no tenderness.       Chaperone was present for examination of the right breast revealing at approximately the 7:00 region (inferior, lateral quadrant) a tender subcutaneous nodule or firmness without overlying erythema or induration or fluctuance and no obvious subcutaneous focal fluid collection with limited ultrasound at the bedside  Abdominal: Soft. There is no tenderness. There is no rebound.  Musculoskeletal: She exhibits no tenderness.       Baseline ROM, no obvious new focal weakness.  Neurological:       Mental status and motor strength appears baseline for patient and situation.  Skin: No rash noted.  Psychiatric: She has a normal mood and affect.    ED Course  Procedures (including critical care time)  Labs Reviewed - No data to display No results found.   1. Breast pain in female       MDM          Hurman Horn, MD 02/27/11 2131

## 2011-02-27 NOTE — ED Notes (Signed)
Family at bedside. 

## 2011-03-15 ENCOUNTER — Other Ambulatory Visit: Payer: Self-pay | Admitting: Internal Medicine

## 2011-03-15 DIAGNOSIS — N63 Unspecified lump in unspecified breast: Secondary | ICD-10-CM

## 2011-03-21 ENCOUNTER — Institutional Professional Consult (permissible substitution): Payer: Medicaid Other | Admitting: Pulmonary Disease

## 2011-03-31 ENCOUNTER — Ambulatory Visit
Admission: RE | Admit: 2011-03-31 | Discharge: 2011-03-31 | Disposition: A | Discharge status: Home / Self Care | Payer: Medicaid Other | Source: Ambulatory Visit | Attending: Internal Medicine | Admitting: Internal Medicine

## 2011-03-31 ENCOUNTER — Other Ambulatory Visit: Payer: Self-pay | Admitting: Internal Medicine

## 2011-03-31 DIAGNOSIS — N63 Unspecified lump in unspecified breast: Secondary | ICD-10-CM

## 2011-04-15 ENCOUNTER — Institutional Professional Consult (permissible substitution): Payer: Medicaid Other | Admitting: Pulmonary Disease

## 2011-04-20 ENCOUNTER — Ambulatory Visit
Admission: RE | Admit: 2011-04-20 | Discharge: 2011-04-20 | Disposition: A | Discharge status: Home / Self Care | Payer: Medicaid Other | Source: Ambulatory Visit | Attending: Internal Medicine | Admitting: Internal Medicine

## 2011-04-20 DIAGNOSIS — N63 Unspecified lump in unspecified breast: Secondary | ICD-10-CM

## 2011-05-02 ENCOUNTER — Institutional Professional Consult (permissible substitution): Payer: Medicaid Other | Admitting: Pulmonary Disease

## 2011-05-13 ENCOUNTER — Encounter: Payer: Self-pay | Admitting: Pulmonary Disease

## 2011-05-16 ENCOUNTER — Institutional Professional Consult (permissible substitution): Payer: Medicaid Other | Admitting: Pulmonary Disease

## 2011-06-07 ENCOUNTER — Ambulatory Visit (INDEPENDENT_AMBULATORY_CARE_PROVIDER_SITE_OTHER): Payer: Medicaid Other | Admitting: Pulmonary Disease

## 2011-06-07 ENCOUNTER — Encounter: Payer: Self-pay | Admitting: Pulmonary Disease

## 2011-06-07 VITALS — BP 124/86 | HR 98 | Temp 98.8°F | Ht 60.0 in | Wt 132.4 lb

## 2011-06-07 DIAGNOSIS — R911 Solitary pulmonary nodule: Secondary | ICD-10-CM

## 2011-06-07 NOTE — Progress Notes (Signed)
  Subjective:    Patient ID: Rachel Fischer, female    DOB: [birth year], 40 y.o.   MRN: [id number]  HPI The patient is a 41 year old female who I've been asked to see for a pulmonary nodule.  This was first noted in December of 2011 by CT chest, where she was found to have a 3 mm nodule that was subpleural in location in the left lower lobe.  She has not had any followup since that time.  The patient does have a history of smoking for 19 years, but has no personal history of cancer.  She has never lived in [place] or [place], and has no history of exposure to tuberculosis.  She has had a negative PPD in the past.  She has no significant occupational exposures.  She denies any cough, mucus production, or shortness of breath of significance.  He has had no hemoptysis, weight loss, or anorexia.   Review of Systems  Constitutional: Negative.  Negative for fever and unexpected weight change.  HENT: Negative for ear pain, nosebleeds, congestion, sore throat, rhinorrhea, sneezing, trouble swallowing, dental problem, postnasal drip and sinus pressure.   Eyes: Negative.  Negative for redness and itching.  Respiratory: Positive for shortness of breath. Negative for cough, chest tightness and wheezing.   Cardiovascular: Negative.  Negative for palpitations and leg swelling.  Gastrointestinal: Negative.  Negative for nausea and vomiting.  Genitourinary: Negative.  Negative for dysuria.  Musculoskeletal: Negative.  Negative for joint swelling.  Skin: Negative.  Negative for rash.  Neurological: Positive for headaches.  Hematological: Negative.  Does not bruise/bleed easily.  Psychiatric/Behavioral: Negative.  Negative for dysphoric mood. The patient is not nervous/anxious.        Objective:   Physical Exam Constitutional:  Well developed, no acute distress  HENT:  Nares patent without discharge  Oropharynx without exudate, palate and uvula are normal  Eyes:  Perrla, eomi, no scleral  icterus  Neck:  No JVD, no TMG  Cardiovascular:  Normal rate, regular rhythm, no rubs or gallops.  No murmurs        Intact distal pulses  Pulmonary :  Normal breath sounds, no stridor or respiratory distress   No rales, rhonchi, or wheezing  Abdominal:  Soft, nondistended, bowel sounds present.  No tenderness noted.   Musculoskeletal:  No lower extremity edema noted.  Lymph Nodes:  No cervical lymphadenopathy noted  Skin:  No cyanosis noted  Neurologic:  Alert, appropriate, moves all 4 extremities without obvious deficit.         Assessment & Plan:

## 2011-06-07 NOTE — Assessment & Plan Note (Signed)
The patient has a 3 mm left lower lobe pulmonary nodule noted on CT chest in December of 2011.  Although she is not in a totally low risk category because of her smoking, overall I suspect her risk for bronchogenic cancer is low.  More than likely, this is an intraparenchymal lymph node, but she needs CT followup to be sure.  If the nodule in question is unchanged after almost a year and a half, I suspect this is benign and needs no further workup.  I will go ahead and set patient up for a new CT chest, and have also strongly encouraged her to stop smoking.

## 2011-06-07 NOTE — Patient Instructions (Signed)
Will set up for ct chest, and will call you with results. Stop smoking!  This will greatly reduce future risks.

## 2011-06-09 ENCOUNTER — Ambulatory Visit (INDEPENDENT_AMBULATORY_CARE_PROVIDER_SITE_OTHER)
Admission: RE | Admit: 2011-06-09 | Discharge: 2011-06-09 | Disposition: A | Discharge status: Home / Self Care | Payer: Medicaid Other | Source: Ambulatory Visit | Attending: Pulmonary Disease | Admitting: Pulmonary Disease

## 2011-06-09 DIAGNOSIS — R911 Solitary pulmonary nodule: Secondary | ICD-10-CM

## 2011-06-13 ENCOUNTER — Telehealth: Payer: Self-pay | Admitting: Pulmonary Disease

## 2011-06-13 NOTE — Telephone Encounter (Signed)
Called and spoke with pt.  Pt aware of CT results.  Nothing further needed.   

## 2011-06-13 NOTE — Telephone Encounter (Signed)
Pt is requesting CT results from 06-09-11. Please advise.Carron Curie, CMA

## 2011-10-07 ENCOUNTER — Emergency Department (HOSPITAL_COMMUNITY)
Admission: EM | Admit: 2011-10-07 | Discharge: 2011-10-07 | Disposition: A | Discharge status: Home / Self Care | Payer: Medicaid Other | Attending: Emergency Medicine | Admitting: Emergency Medicine

## 2011-10-07 ENCOUNTER — Encounter (HOSPITAL_COMMUNITY): Payer: Self-pay | Admitting: *Deleted

## 2011-10-07 DIAGNOSIS — F17200 Nicotine dependence, unspecified, uncomplicated: Secondary | ICD-10-CM | POA: Insufficient documentation

## 2011-10-07 DIAGNOSIS — R5381 Other malaise: Secondary | ICD-10-CM | POA: Insufficient documentation

## 2011-10-07 DIAGNOSIS — D649 Anemia, unspecified: Secondary | ICD-10-CM | POA: Insufficient documentation

## 2011-10-07 DIAGNOSIS — I10 Essential (primary) hypertension: Secondary | ICD-10-CM | POA: Insufficient documentation

## 2011-10-07 DIAGNOSIS — R531 Weakness: Secondary | ICD-10-CM

## 2011-10-07 DIAGNOSIS — R5383 Other fatigue: Secondary | ICD-10-CM | POA: Insufficient documentation

## 2011-10-07 LAB — CBC WITH DIFFERENTIAL/PLATELET
Basophils Absolute: 0 10*3/uL (ref 0.0–0.1)
Basophils Relative: 1 % (ref 0–1)
Eosinophils Relative: 1 % (ref 0–5)
HCT: 37.9 % (ref 36.0–46.0)
Hemoglobin: 12.7 g/dL (ref 12.0–15.0)
MCHC: 33.5 g/dL (ref 30.0–36.0)
MCV: 86.1 fL (ref 78.0–100.0)
Monocytes Absolute: 0.3 10*3/uL (ref 0.1–1.0)
Monocytes Relative: 4 % (ref 3–12)
RDW: 12.9 % (ref 11.5–15.5)

## 2011-10-07 LAB — BASIC METABOLIC PANEL
BUN: 6 mg/dL (ref 6–23)
CO2: 28 mEq/L (ref 19–32)
Calcium: 8.8 mg/dL (ref 8.4–10.5)
Chloride: 103 mEq/L (ref 96–112)
Creatinine, Ser: 0.71 mg/dL (ref 0.50–1.10)
GFR calc Af Amer: >90 mL/min (ref >90)

## 2011-10-07 NOTE — ED Provider Notes (Signed)
Pt here from  where she works  with complaints of 3 near syncopal episodes that lasted 2-3 seconds each.  She states that her vision went blank (for only a few seconds), she felt weak and dizzy and felt as though she was going to pass out.   She denies having any other associated symptoms. She denies recent illness. She admits to drinking plenty of water and eating a good breakfast this morning. CBG 97.  Her PMH is positive for uncontrolled fluctuating blood pressure, she states she is otherwise healthy. Her PCP is Dr. Concepcion Elk. I am moving patient to exam room from fast track for more in depth work-up for near syncope.   EKG and orthostatics ordered.   Dorthula Matas, PA 10/07/11 1607  Dorthula Matas, PA 10/07/11 [phone]

## 2011-10-07 NOTE — ED Provider Notes (Signed)
Medical screening examination/treatment/procedure(s) were performed by non-physician practitioner and as supervising physician I was immediately available for consultation/collaboration.    Nelia Shi, MD 10/07/11 [phone]

## 2011-10-07 NOTE — ED Provider Notes (Addendum)
History     CSN: [id number]  Arrival date & time 10/07/11  1241   First MD Initiated Contact with Patient 10/07/11 1553      Chief Complaint  Patient presents with  . Hypertension  . Near Syncope    (Consider location/radiation/quality/duration/timing/severity/associated sxs/prior treatment) Patient is a 41 y.o. female presenting with hypertension.  Hypertension   Reports that she became hot and shaky at work today while working on [employer] 9 AM today. Her blood pressure was taken which was 148/103. Patient had alert in lasting a split second. Resolve spontaneously. Denies chest pain denies shortness of breath denies headache denies other complaint. Vision is currently normal Past Medical History  Diagnosis Date  . Hypertension   . Migraine headache   . Lung nodule   . Tobacco use   . Anemia     Past Surgical History  Procedure Date  . Abdominal hysterectomy     Family History  Problem Relation Age of Onset  . Breast cancer Mother     History  Substance Use Topics  . Smoking status: Current Everyday Smoker -- 0.5 packs/day for 19 years    Types: Cigarettes  . Smokeless tobacco: Not on file  . Alcohol Use: No    OB History    Grav Para Term Preterm Abortions TAB SAB Ect Mult Living                  Review of Systems  Eyes: Positive for visual disturbance.  Neurological: Positive for weakness.  All other systems reviewed and are negative.    Allergies  Morphine and related and Penicillins  Home Medications   Current Outpatient Rx  Name Route Sig Dispense Refill  . OLMESARTAN MEDOXOMIL-HCTZ 20-12.5 MG PO TABS Oral Take 1 tablet by mouth daily.        BP 126/97  Pulse 80  Temp 98.5 F (36.9 C) (Oral)  Resp 16  SpO2 100%  Physical Exam  Nursing note and vitals reviewed. Constitutional: She appears well-developed and well-nourished.  HENT:  Head: Normocephalic and atraumatic.  Eyes: Conjunctivae are normal. Pupils are equal, round, and  reactive to light.  Neck: Neck supple. No tracheal deviation present. No thyromegaly present.  Cardiovascular: Normal rate and regular rhythm.   No murmur heard. Pulmonary/Chest: Effort normal and breath sounds normal.  Abdominal: Soft. Bowel sounds are normal. She exhibits no distension. There is no tenderness.  Musculoskeletal: Normal range of motion. She exhibits no edema and no tenderness.  Neurological: She is alert. Coordination normal.       Gait normal not lightheaded on standing  Skin: Skin is warm and dry. No rash noted.  Psychiatric: She has a normal mood and affect.    ED Course  Procedures (including critical care time)  Labs Reviewed  CBC WITH DIFFERENTIAL - Abnormal; Notable for the following:    Lymphocytes Relative 51 (*)     All other components within normal limits  GLUCOSE, CAPILLARY  BASIC METABOLIC PANEL   No results found.   Date: 10/07/2011  Rate: 70  Rhythm: normal sinus rhythm  QRS Axis: normal  Intervals: normal  ST/T Wave abnormalities: normal  Conduction Disutrbances: none  Narrative Interpretation: unremarkable  Unchanged from 02/20/2010  No diagnosis found. Results for orders placed during the hospital encounter of 10/07/11  GLUCOSE, CAPILLARY      Component Value Range   Glucose-Capillary 97  70 - 99 mg/dL   Comment 1 Notify RN    CBC WITH  DIFFERENTIAL      Component Value Range   WBC 6.1  4.0 - 10.5 K/uL   RBC 4.40  3.87 - 5.11 MIL/uL   Hemoglobin 12.7  12.0 - 15.0 g/dL   HCT 24.4  01.0 - 27.2 %   MCV 86.1  78.0 - 100.0 fL   MCH 28.9  26.0 - 34.0 pg   MCHC 33.5  30.0 - 36.0 g/dL   RDW 53.6  64.4 - 03.4 %   Platelets 337  150 - 400 K/uL   Neutrophils Relative 43  43 - 77 %   Neutro Abs 2.6  1.7 - 7.7 K/uL   Lymphocytes Relative 51 (*) 12 - 46 %   Lymphs Abs 3.1  0.7 - 4.0 K/uL   Monocytes Relative 4  3 - 12 %   Monocytes Absolute 0.3  0.1 - 1.0 K/uL   Eosinophils Relative 1  0 - 5 %   Eosinophils Absolute 0.1  0.0 - 0.7 K/uL     Basophils Relative 1  0 - 1 %   Basophils Absolute 0.0  0.0 - 0.1 K/uL  BASIC METABOLIC PANEL      Component Value Range   Sodium 139  135 - 145 mEq/L   Potassium 3.6  3.5 - 5.1 mEq/L   Chloride 103  96 - 112 mEq/L   CO2 28  19 - 32 mEq/L   Glucose, Bld 87  70 - 99 mg/dL   BUN 6  6 - 23 mg/dL   Creatinine, Ser 7.42  0.50 - 1.10 mg/dL   Calcium 8.8  8.4 - 59.5 mg/dL   GFR calc non Af Amer >90  >90 mL/min   GFR calc Af Amer >90  >90 mL/min   No results found.  7:05 PM remains asymptomatic   MDM  Plan followupwith Dr.Avbuere Diagnosis transient weakness        Doug Sou, MD 10/07/11 1911  Doug Sou, MD 10/07/11 [id number]

## 2011-10-07 NOTE — ED Notes (Signed)
Pt reports at work and had blood pressure checked- 148/103. Told to come to ED. Pt has hx of hypertension and on BP medications. Reports feeling shaky at work. CBG 97.

## 2011-11-08 ENCOUNTER — Emergency Department (HOSPITAL_COMMUNITY)
Admission: EM | Admit: 2011-11-08 | Discharge: 2011-11-08 | Disposition: A | Discharge status: Home / Self Care | Payer: Medicaid Other | Attending: Emergency Medicine | Admitting: Emergency Medicine

## 2011-11-08 ENCOUNTER — Encounter (HOSPITAL_COMMUNITY): Payer: Self-pay | Admitting: Emergency Medicine

## 2011-11-08 DIAGNOSIS — I10 Essential (primary) hypertension: Secondary | ICD-10-CM | POA: Insufficient documentation

## 2011-11-08 DIAGNOSIS — F17200 Nicotine dependence, unspecified, uncomplicated: Secondary | ICD-10-CM | POA: Insufficient documentation

## 2011-11-08 DIAGNOSIS — N390 Urinary tract infection, site not specified: Secondary | ICD-10-CM | POA: Insufficient documentation

## 2011-11-08 DIAGNOSIS — D649 Anemia, unspecified: Secondary | ICD-10-CM | POA: Insufficient documentation

## 2011-11-08 LAB — URINALYSIS, ROUTINE W REFLEX MICROSCOPIC
Bilirubin Urine: NEGATIVE
Glucose, UA: NEGATIVE mg/dL
Ketones, ur: NEGATIVE mg/dL
Protein, ur: NEGATIVE mg/dL
pH: 6.5 (ref 5.0–8.0)

## 2011-11-08 LAB — URINE MICROSCOPIC-ADD ON

## 2011-11-08 MED ORDER — CIPROFLOXACIN HCL 500 MG PO TABS: 500.0000 mg | ORAL_TABLET | Freq: Two times a day (BID) | ORAL | Status: DC

## 2011-11-08 MED ORDER — IBUPROFEN 800 MG PO TABS
800.0000 mg | ORAL_TABLET | Freq: Once | ORAL | Status: AC
  Administered 2011-11-08: 800 mg via ORAL
  Filled 2011-11-08: qty 1

## 2011-11-08 MED ORDER — FLUCONAZOLE 150 MG PO TABS: 150.0000 mg | ORAL_TABLET | Freq: Once | ORAL | Status: DC

## 2011-11-08 NOTE — ED Notes (Signed)
Report received, assumed care.  

## 2011-11-08 NOTE — ED Notes (Addendum)
PT has frequent urination; flank pain on right side; has had hx of UTI's.

## 2011-11-08 NOTE — ED Provider Notes (Signed)
History     CSN: [id number]  Arrival date & time 11/08/11  0700   First MD Initiated Contact with Patient 11/08/11 [phone]      Chief Complaint  Patient presents with  . Urinary Tract Infection    (Consider location/radiation/quality/duration/timing/severity/associated sxs/prior treatment) HPI Comments: 41 y/o female presents to the ED with UTI symptoms since yesterday at work. Admits to flank pain, increased urinary frequency and urgency. Describes pain as "annoying" rated 8/10 radiating around to her suprapubic region. Admits to vomiting once yesterday, but she does not feel nauseated. She has had many UTI's in the past and this feels the same. Denies any dysuria, hematuria, fever, chills, appetite change. States she drinks water all day long at work.  Patient is a 41 y.o. female presenting with urinary tract infection. The history is provided by the patient.  Urinary Tract Infection Associated symptoms include vomiting. Pertinent negatives include no abdominal pain, chest pain, chills, diaphoresis, fever, headaches, nausea, neck pain or rash.    Past Medical History  Diagnosis Date  . Hypertension   . Migraine headache   . Lung nodule   . Tobacco use   . Anemia     Past Surgical History  Procedure Date  . Abdominal hysterectomy     Family History  Problem Relation Age of Onset  . Breast cancer Mother     History  Substance Use Topics  . Smoking status: Current Every Day Smoker -- 0.5 packs/day for 19 years    Types: Cigarettes  . Smokeless tobacco: Not on file  . Alcohol Use: No    OB History    Grav Para Term Preterm Abortions TAB SAB Ect Mult Living                  Review of Systems  Constitutional: Negative for fever, chills, diaphoresis and appetite change.  HENT: Negative for neck pain and neck stiffness.   Respiratory: Negative for shortness of breath.   Cardiovascular: Negative for chest pain.  Gastrointestinal: Positive for vomiting. Negative for  nausea and abdominal pain.  Genitourinary: Positive for frequency and flank pain. Negative for dysuria, urgency and hematuria.  Musculoskeletal: Positive for back pain.  Skin: Negative for rash.  Neurological: Negative for headaches.    Allergies  Morphine and related and Penicillins  Home Medications   Current Outpatient Rx  Name Route Sig Dispense Refill  . OLMESARTAN MEDOXOMIL-HCTZ 20-12.5 MG PO TABS Oral Take 1 tablet by mouth daily.        BP 114/81  Pulse 93  Temp 97.1 F (36.2 C) (Oral)  Resp 18  SpO2 98%  Physical Exam  Constitutional: She is oriented to person, place, and time. She appears well-developed and well-nourished. No distress.  HENT:  Head: Normocephalic and atraumatic.  Eyes: Conjunctivae normal are normal.  Neck: Normal range of motion. Neck supple.  Cardiovascular: Normal rate, regular rhythm and normal heart sounds.   Pulmonary/Chest: Effort normal and breath sounds normal.  Abdominal: Soft. Normal appearance and bowel sounds are normal. There is tenderness in the suprapubic area. There is CVA tenderness (right sided). There is no rebound and no guarding.  Musculoskeletal: Normal range of motion. She exhibits no edema.  Neurological: She is alert and oriented to person, place, and time.  Skin: Skin is warm and dry. No rash noted. She is not diaphoretic.  Psychiatric: She has a normal mood and affect. Her behavior is normal.    ED Course  Procedures (including critical care time)  Labs Reviewed  URINALYSIS, ROUTINE W REFLEX MICROSCOPIC  URINE CULTURE   Results for orders placed during the hospital encounter of 11/08/11  URINALYSIS, ROUTINE W REFLEX MICROSCOPIC      Component Value Range   Color, Urine YELLOW  YELLOW   APPearance CLOUDY (*) CLEAR   Specific Gravity, Urine 1.021  1.005 - 1.030   pH 6.5  5.0 - 8.0   Glucose, UA NEGATIVE  NEGATIVE mg/dL   Hgb urine dipstick SMALL (*) NEGATIVE   Bilirubin Urine NEGATIVE  NEGATIVE   Ketones,  ur NEGATIVE  NEGATIVE mg/dL   Protein, ur NEGATIVE  NEGATIVE mg/dL   Urobilinogen, UA 0.2  0.0 - 1.0 mg/dL   Nitrite POSITIVE (*) NEGATIVE   Leukocytes, UA MODERATE (*) NEGATIVE  URINE MICROSCOPIC-ADD ON      Component Value Range   Squamous Epithelial / LPF MANY (*) RARE   WBC, UA 7-10  <3 WBC/hpf   RBC / HPF 3-6  <3 RBC/hpf   Bacteria, UA MANY (*) RARE    No results found.   1. Urinary tract infection       MDM  41 year old female with a urinary tract infection. I will choose Cipro. Patient states she always gets yeast infection from antibiotics, so I will give her Diflucan in case she develops one. Advised increase fluid intake. Close return precautions discussed.        Trevor Mace, PA-C 11/08/11 [phone]

## 2011-11-09 LAB — URINE CULTURE: Colony Count: >=100000

## 2011-11-09 NOTE — ED Provider Notes (Signed)
Medical screening examination/treatment/procedure(s) were performed by non-physician practitioner and as supervising physician I was immediately available for consultation/collaboration.   Lindalee Huizinga, MD 11/09/11 0853 

## 2011-11-10 NOTE — ED Notes (Signed)
+   Cipro-sensitive to same-chart appended per protocol MD. 

## 2011-11-12 ENCOUNTER — Encounter (HOSPITAL_COMMUNITY): Payer: Self-pay

## 2011-11-12 ENCOUNTER — Emergency Department (HOSPITAL_COMMUNITY)
Admission: EM | Admit: 2011-11-12 | Discharge: 2011-11-12 | Disposition: A | Discharge status: Home / Self Care | Payer: Medicaid Other | Source: Home / Self Care | Attending: Emergency Medicine | Admitting: Emergency Medicine

## 2011-11-12 DIAGNOSIS — M791 Myalgia, unspecified site: Secondary | ICD-10-CM

## 2011-11-12 DIAGNOSIS — IMO0001 Reserved for inherently not codable concepts without codable children: Secondary | ICD-10-CM

## 2011-11-12 LAB — CBC WITH DIFFERENTIAL/PLATELET
Basophils Absolute: 0 10*3/uL (ref 0.0–0.1)
Basophils Relative: 0 % (ref 0–1)
Eosinophils Absolute: 0 10*3/uL (ref 0.0–0.7)
Eosinophils Relative: 0 % (ref 0–5)
Lymphs Abs: 1.3 10*3/uL (ref 0.7–4.0)
MCH: 28.8 pg (ref 26.0–34.0)
MCV: 85.1 fL (ref 78.0–100.0)
Neutrophils Relative %: 74 % (ref 43–77)
Platelets: 262 10*3/uL (ref 150–400)
RBC: 4.23 MIL/uL (ref 3.87–5.11)
RDW: 12.7 % (ref 11.5–15.5)

## 2011-11-12 LAB — COMPREHENSIVE METABOLIC PANEL
ALT: 13 U/L (ref 0–35)
AST: 20 U/L (ref 0–37)
Albumin: 4.1 g/dL (ref 3.5–5.2)
Alkaline Phosphatase: 49 U/L (ref 39–117)
Calcium: 9.2 mg/dL (ref 8.4–10.5)
GFR calc Af Amer: >90 mL/min (ref >90)
Glucose, Bld: 100 mg/dL — ABNORMAL HIGH (ref 70–99)
Potassium: 3.7 mEq/L (ref 3.5–5.1)
Sodium: 135 mEq/L (ref 135–145)
Total Protein: 6.9 g/dL (ref 6.0–8.3)

## 2011-11-12 LAB — POCT URINALYSIS DIP (DEVICE)
Leukocytes, UA: NEGATIVE
Protein, ur: NEGATIVE mg/dL
Urobilinogen, UA: 1 mg/dL (ref 0.0–1.0)
pH: 6.5 (ref 5.0–8.0)

## 2011-11-12 MED ORDER — HYDROCODONE-ACETAMINOPHEN 5-325 MG PO TABS
ORAL_TABLET | ORAL | Status: AC
  Filled 2011-11-12: qty 2

## 2011-11-12 MED ORDER — HYDROCODONE-ACETAMINOPHEN 5-325 MG PO TABS
2.0000 | ORAL_TABLET | Freq: Once | ORAL | Status: AC
  Administered 2011-11-12: 2 via ORAL

## 2011-11-12 MED ORDER — HYDROCODONE-ACETAMINOPHEN 5-325 MG PO TABS: ORAL_TABLET | ORAL | Status: DC

## 2011-11-12 NOTE — ED Provider Notes (Signed)
Chief Complaint  Patient presents with  . Generalized Body Aches    History of Present Illness:   The patient is a 41 year old female who had onset of generalized head to toe aching yesterday. She has pain in all her muscles, all of her joints, her head, her back, chest, or abdomen. She denies any swelling of any of these areas. She has no muscle weakness, numbness, or tingling. She denies any fever, but has felt chilled and has had some sweats. She's had no URI symptoms including nasal congestion, rhinorrhea, sore throat, adenopathy, or cough. She denies any nausea, vomiting, or diarrhea. She has had some headache. She was seen at the emergency department 4 days ago for urinary tract infection and was begun on Cipro and has been taking this since that. She does not recall having taken Cipro before. She has no known allergy to Cipro or other fluoroquinolones. She is allergic to morphine and penicillins. She has a history of high blood pressure and takes Benicar for that. She denies any skin rash. She has not had any urinary symptoms the past couple days.  Review of Systems:  Other than noted above, the patient denies any of the following symptoms. Systemic:  No fever, chills, sweats, fatigue, myalgias, headache, or anorexia. Eye:  No redness, pain or drainage. ENT:  No earache, nasal congestion, rhinorrhea, sinus pressure, or sore throat. Lungs:  No cough, sputum production, wheezing, shortness of breath.  Cardiovascular:  No chest pain, palpitations, or syncope. GI:  No nausea, vomiting, abdominal pain or diarrhea. GU:  No dysuria, frequency, or hematuria. Skin:  No rash or pruritis.  PMFSH:  Past medical history, family history, social history, meds, and allergies were reviewed.   Physical Exam:   Vital signs:  BP 133/93  Pulse 101  Temp 98 F (36.7 C) (Oral)  Resp 21  SpO2 100% General:  Alert, in no distress. Eye:  PERRL, full EOMs.  Lids and conjunctivas were normal. ENT:  TMs and  canals were normal, without erythema or inflammation.  Nasal mucosa was clear and uncongested, without drainage.  Mucous membranes were moist.  Pharynx was clear, without exudate or drainage.  There were no oral ulcerations or lesions. Neck:  Supple, no adenopathy, tenderness or mass. Thyroid was normal. Lungs:  No respiratory distress.  Lungs were clear to auscultation, without wheezes, rales or rhonchi.  Breath sounds were clear and equal bilaterally. Heart:  Regular rhythm, without gallops, murmers or rubs. Abdomen:  Soft, flat, and non-tender to palpation.  No hepatosplenomagaly or mass. Extremities: There is tenderness to palpation in all her muscles and joints with no swelling or limited range of motion. Neurological exam: Alert and oriented x3, no muscle weakness, normal muscle strength and DTRs, normal cranial nerves. Skin:  Clear, warm, and dry, without rash or lesions.  Labs:   Results for orders placed during the hospital encounter of 11/12/11  CBC WITH DIFFERENTIAL      Component Value Range   WBC 7.5  4.0 - 10.5 K/uL   RBC 4.23  3.87 - 5.11 MIL/uL   Hemoglobin 12.2  12.0 - 15.0 g/dL   HCT 16.1  09.6 - 04.5 %   MCV 85.1  78.0 - 100.0 fL   MCH 28.8  26.0 - 34.0 pg   MCHC 33.9  30.0 - 36.0 g/dL   RDW 40.9  81.1 - 91.4 %   Platelets 262  150 - 400 K/uL   Neutrophils Relative 74  43 - 77 %  Neutro Abs 5.5  1.7 - 7.7 K/uL   Lymphocytes Relative 18  12 - 46 %   Lymphs Abs 1.3  0.7 - 4.0 K/uL   Monocytes Relative 8  3 - 12 %   Monocytes Absolute 0.6  0.1 - 1.0 K/uL   Eosinophils Relative 0  0 - 5 %   Eosinophils Absolute 0.0  0.0 - 0.7 K/uL   Basophils Relative 0  0 - 1 %   Basophils Absolute 0.0  0.0 - 0.1 K/uL  COMPREHENSIVE METABOLIC PANEL      Component Value Range   Sodium 135  135 - 145 mEq/L   Potassium 3.7  3.5 - 5.1 mEq/L   Chloride 102  96 - 112 mEq/L   CO2 24  19 - 32 mEq/L   Glucose, Bld 100 (*) 70 - 99 mg/dL   BUN 6  6 - 23 mg/dL   Creatinine, Ser 1.30  0.50  - 1.10 mg/dL   Calcium 9.2  8.4 - 86.5 mg/dL   Total Protein 6.9  6.0 - 8.3 g/dL   Albumin 4.1  3.5 - 5.2 g/dL   AST 20  0 - 37 U/L   ALT 13  0 - 35 U/L   Alkaline Phosphatase 49  39 - 117 U/L   Total Bilirubin 0.4  0.3 - 1.2 mg/dL   GFR calc non Af Amer >90  >90 mL/min   GFR calc Af Amer >90  >90 mL/min  CK      Component Value Range   Total CK 114  7 - 177 U/L  POCT URINALYSIS DIP (DEVICE)      Component Value Range   Glucose, UA NEGATIVE  NEGATIVE mg/dL   Bilirubin Urine SMALL (*) NEGATIVE   Ketones, ur TRACE (*) NEGATIVE mg/dL   Specific Gravity, Urine 1.020  1.005 - 1.030   Hgb urine dipstick SMALL (*) NEGATIVE   pH 6.5  5.0 - 8.0   Protein, ur NEGATIVE  NEGATIVE mg/dL   Urobilinogen, UA 1.0  0.0 - 1.0 mg/dL   Nitrite NEGATIVE  NEGATIVE   Leukocytes, UA NEGATIVE  NEGATIVE    Assessment:  The encounter diagnosis was Myalgia.  I think this is most likely due to a medication reaction to Cipro. I have told her to stop it and to get extra fluids. If she's not feeling better in 48-72 hours suggested she return. I advised her not to take Cipro or other fluoroquinolones in the future. I have obtained a urine culture and we'll call her if she needs to take another antibiotic.  Plan:   1.  The following meds were prescribed:   New Prescriptions   HYDROCODONE-ACETAMINOPHEN (NORCO/VICODIN) 5-325 MG PER TABLET    1 to 2 tabs every 4 to 6 hours as needed for pain.   2.  The patient was instructed in symptomatic care and handouts were given. 3.  The patient was told to return if becoming worse in any way, if no better in 3 or 4 days, and given some red flag symptoms that would indicate earlier return.    Reuben Likes, MD 11/12/11 2041

## 2011-11-12 NOTE — ED Notes (Signed)
Pt started having bodyaches yesterday, pt is tearful and states "It hurts to lay down and I can't sleep"  Pt was seen in ED on 9-17 for uti and is taking Cipro.

## 2011-11-15 ENCOUNTER — Telehealth (HOSPITAL_COMMUNITY): Payer: Self-pay | Admitting: *Deleted

## 2011-11-15 LAB — URINE CULTURE
Colony Count: 85000
Special Requests: NORMAL

## 2011-11-15 NOTE — ED Notes (Signed)
Pt called and made aware of urine culture results.  Order received for Macrobid 1 po bid X 7 days from Hayden Rasmussen, [place] and called to [facility] on [address].  Pt made aware, will pick up and take.

## 2012-02-06 ENCOUNTER — Emergency Department (HOSPITAL_COMMUNITY)
Admission: EM | Admit: 2012-02-06 | Discharge: 2012-02-06 | Discharge status: Against Medical Advice | Payer: Self-pay | Attending: Emergency Medicine | Admitting: Emergency Medicine

## 2012-02-06 ENCOUNTER — Encounter (HOSPITAL_COMMUNITY): Payer: Self-pay | Admitting: Adult Health

## 2012-02-06 ENCOUNTER — Emergency Department (HOSPITAL_COMMUNITY): Payer: Self-pay

## 2012-02-06 DIAGNOSIS — R079 Chest pain, unspecified: Secondary | ICD-10-CM | POA: Insufficient documentation

## 2012-02-06 DIAGNOSIS — M549 Dorsalgia, unspecified: Secondary | ICD-10-CM | POA: Insufficient documentation

## 2012-02-06 DIAGNOSIS — F17200 Nicotine dependence, unspecified, uncomplicated: Secondary | ICD-10-CM | POA: Insufficient documentation

## 2012-02-06 DIAGNOSIS — R0602 Shortness of breath: Secondary | ICD-10-CM | POA: Insufficient documentation

## 2012-02-06 LAB — CBC
HCT: 38 % (ref 36.0–46.0)
MCHC: 33.4 g/dL (ref 30.0–36.0)
MCV: 84.3 fL (ref 78.0–100.0)
Platelets: 285 10*3/uL (ref 150–400)
RDW: 12.9 % (ref 11.5–15.5)

## 2012-02-06 LAB — BASIC METABOLIC PANEL
BUN: 6 mg/dL (ref 6–23)
Creatinine, Ser: 0.67 mg/dL (ref 0.50–1.10)
GFR calc Af Amer: >90 mL/min (ref >90)
GFR calc non Af Amer: >90 mL/min (ref >90)
Potassium: 3.8 mEq/L (ref 3.5–5.1)

## 2012-02-06 LAB — POCT I-STAT TROPONIN I: Troponin i, poc: 0 ng/mL (ref 0.00–0.08)

## 2012-02-06 NOTE — ED Notes (Signed)
UNABLE TO LOCATE PT. AT TRIAGE SEVERAL TIMES.  

## 2012-02-06 NOTE — ED Notes (Signed)
Presents with left sided upper back pain that radiates to left chest and under left breat. Pain is described as tightness and associated with SOB. Pain is worse with movement and breathing. Pain began 4 days after waking up and progressively gotten worse. Nothing makes pain better. Denies nausea. Pt states, "It feels like something is stuck in my chest through to my back. It is all tight under my ribs and under my breast"

## 2013-07-12 ENCOUNTER — Other Ambulatory Visit: Payer: Self-pay | Admitting: Internal Medicine

## 2013-07-12 DIAGNOSIS — N6310 Unspecified lump in the right breast, unspecified quadrant: Secondary | ICD-10-CM

## 2013-07-12 DIAGNOSIS — R921 Mammographic calcification found on diagnostic imaging of breast: Secondary | ICD-10-CM

## 2013-07-25 ENCOUNTER — Other Ambulatory Visit: Payer: Medicaid Other

## 2013-08-06 ENCOUNTER — Other Ambulatory Visit: Payer: Medicaid Other

## 2013-09-09 ENCOUNTER — Emergency Department (INDEPENDENT_AMBULATORY_CARE_PROVIDER_SITE_OTHER)
Admission: EM | Admit: 2013-09-09 | Discharge: 2013-09-09 | Disposition: A | Discharge status: Home / Self Care | Payer: Self-pay | Source: Home / Self Care

## 2013-09-09 ENCOUNTER — Encounter (HOSPITAL_COMMUNITY): Payer: Self-pay | Admitting: Emergency Medicine

## 2013-09-09 DIAGNOSIS — M542 Cervicalgia: Secondary | ICD-10-CM

## 2013-09-09 DIAGNOSIS — G43009 Migraine without aura, not intractable, without status migrainosus: Secondary | ICD-10-CM

## 2013-09-09 DIAGNOSIS — G44209 Tension-type headache, unspecified, not intractable: Secondary | ICD-10-CM

## 2013-09-09 MED ORDER — KETOROLAC TROMETHAMINE 60 MG/2ML IM SOLN
INTRAMUSCULAR | Status: AC
  Filled 2013-09-09: qty 2

## 2013-09-09 MED ORDER — KETOROLAC TROMETHAMINE 10 MG PO TABS: 10.0000 mg | ORAL_TABLET | Freq: Four times a day (QID) | ORAL | Status: DC | PRN

## 2013-09-09 MED ORDER — METOCLOPRAMIDE HCL 5 MG/ML IJ SOLN
5.0000 mg | Freq: Once | INTRAMUSCULAR | Status: AC
  Administered 2013-09-09: 5 mg via INTRAMUSCULAR

## 2013-09-09 MED ORDER — ONDANSETRON HCL 4 MG PO TABS: 4.0000 mg | ORAL_TABLET | Freq: Four times a day (QID) | ORAL | Status: DC

## 2013-09-09 MED ORDER — DEXAMETHASONE SODIUM PHOSPHATE 10 MG/ML IJ SOLN: 5.0000 mg | Freq: Once | INTRAMUSCULAR | Status: DC

## 2013-09-09 MED ORDER — ONDANSETRON 4 MG PO TBDP
4.0000 mg | ORAL_TABLET | Freq: Once | ORAL | Status: AC
  Administered 2013-09-09: 4 mg via ORAL

## 2013-09-09 MED ORDER — METHYLPREDNISOLONE SODIUM SUCC 125 MG IJ SOLR
INTRAMUSCULAR | Status: AC
  Filled 2013-09-09: qty 2

## 2013-09-09 MED ORDER — KETOROLAC TROMETHAMINE 60 MG/2ML IM SOLN
60.0000 mg | Freq: Once | INTRAMUSCULAR | Status: AC
  Administered 2013-09-09: 60 mg via INTRAMUSCULAR

## 2013-09-09 MED ORDER — METOCLOPRAMIDE HCL 5 MG/ML IJ SOLN
INTRAMUSCULAR | Status: AC
  Filled 2013-09-09: qty 2

## 2013-09-09 MED ORDER — ONDANSETRON 4 MG PO TBDP
ORAL_TABLET | ORAL | Status: AC
  Filled 2013-09-09: qty 1

## 2013-09-09 MED ORDER — METHYLPREDNISOLONE SODIUM SUCC 125 MG IJ SOLR
80.0000 mg | Freq: Once | INTRAMUSCULAR | Status: AC
  Administered 2013-09-09: 80 mg via INTRAMUSCULAR

## 2013-09-09 NOTE — ED Provider Notes (Signed)
CSN: [id number]     Arrival date & time 09/09/13  1157 History   First MD Initiated Contact with Patient 09/09/13 1251     Chief Complaint  Patient presents with  . Headache   (Consider location/radiation/quality/duration/timing/severity/associated sxs/prior Treatment) HPI Comments: 43 year old female with a history of migraine headaches states that she developed a throbbing headache last p.m. The pain is located behind the eyes and bitemporal. She also had pain at the base of her occiput. She had her friend arrived in her head and neck helped her to feel better. Upon looking from an object to another child temporarily see black spots. She denies other visual disturbances or blurring. She awoke around 2 AM today with pain behind the eyes and puffiness around the eyes and face. This has since improved somewhat. She had nausea and vomited once while in the urgent care. He endorses photophobia. Denies problems with speech, hearing or swallowing. Denies focal paresthesias or weakness. St this headache is similar to past ones but worse. SHe also feels weaker and sleepy.    Past Medical History  Diagnosis Date  . Hypertension   . Migraine headache   . Lung nodule   . Tobacco use   . Anemia    Past Surgical History  Procedure Laterality Date  . Abdominal hysterectomy     Family History  Problem Relation Age of Onset  . Breast cancer Mother    History  Substance Use Topics  . Smoking status: Current Every Day Smoker -- 0.50 packs/day for 19 years    Types: Cigarettes  . Smokeless tobacco: Not on file  . Alcohol Use: No   OB History   Grav Para Term Preterm Abortions TAB SAB Ect Mult Living                 Review of Systems  Constitutional: Positive for activity change and fatigue. Negative for fever and chills.  HENT: Negative for congestion, ear discharge, ear pain, postnasal drip and sore throat.   Eyes: Positive for photophobia. Negative for discharge, redness and itching.   Respiratory: Negative.   Cardiovascular: Negative.   Gastrointestinal: Positive for nausea and vomiting. Negative for abdominal pain.  Genitourinary: Negative.   Musculoskeletal: Negative for back pain and myalgias.  Skin: Negative.   Neurological: Positive for headaches. Negative for dizziness, tremors, seizures, syncope, facial asymmetry, speech difficulty, weakness, light-headedness and numbness.  Psychiatric/Behavioral: Negative.     Allergies  Ciprofloxacin; Morphine and related; and Penicillins  Home Medications   Prior to Admission medications   Medication Sig Start Date End Date Taking? Authorizing Provider  lisinopril-hydrochlorothiazide (PRINZIDE,ZESTORETIC) 10-12.5 MG per tablet Take 1 tablet by mouth daily.   Yes Historical Provider, MD  ketorolac (TORADOL) 10 MG tablet Take 1 tablet (10 mg total) by mouth 4 (four) times daily as needed. 09/09/13   Hayden Rasmussenavid Niajah Sipos, NP  ondansetron (ZOFRAN) 4 MG tablet Take 1 tablet (4 mg total) by mouth every 6 (six) hours. 09/09/13   Hayden Rasmussenavid Vivianne Carles, NP   BP 129/82  Pulse 100  Temp(Src) 98.9 F (37.2 C) (Oral)  Resp 14  SpO2 100% Physical Exam  Nursing note and vitals reviewed. Constitutional: She is oriented to person, place, and time. She appears well-developed and well-nourished. No distress.  Patient is in no distress. She is sitting on the table and follows commands speaks clearly and forcibly. She was seen walking from the bathroom to her room with a brisk, steady and balanced gait.  HENT:  Head: Normocephalic and  atraumatic.  Mouth/Throat: Oropharynx is clear and moist. No oropharyngeal exudate.  Soft palate rises symmetrically  Eyes: Conjunctivae and EOM are normal. Pupils are equal, round, and reactive to light.  Neck: Normal range of motion. Neck supple.  Forward flexion of the neck produces pain in the parathoracic musculature. He also produces discomfort in the bilateral neck muscles that when palpated help to diminish the pain. It  also increases throbbing in the face.  Cardiovascular: Normal rate, regular rhythm, normal heart sounds and intact distal pulses.   Pulmonary/Chest: Effort normal and breath sounds normal. No respiratory distress. She has no wheezes. She has no rales.  Musculoskeletal: She exhibits no edema and no tenderness.  Lymphadenopathy:    She has no cervical adenopathy.  Neurological: She is alert and oriented to person, place, and time. She has normal strength. She displays no tremor. No cranial nerve deficit or sensory deficit. She exhibits normal muscle tone. She displays no seizure activity. Coordination and gait normal.    ED Course  Procedures (including critical care time) Labs Review Labs Reviewed - No data to display  Imaging Review No results found.   MDM   1. Migraine without aura and without status migrainosus, not intractable   2. Cervical muscle pain   3. Muscle tension headache    Objective neuro exam is unremarkable. All tests are nl.  Reglan 5 mg IM and Toradol 60 mg Im and Solumedrol 80 mg  IM and Zofran 4 mg po. Discharged in stable condition. F/U with PCP. If worse or new sx's return or go to the ED.     Hayden Rasmussen, NP 09/09/13 1427

## 2013-09-09 NOTE — ED Notes (Signed)
C/o 2 day duration of HA behind her eyes and in occipital area . Feels some better in dark , quiet area. Minimal relief w tylenol. NAD at present

## 2013-09-09 NOTE — ED Provider Notes (Signed)
Medical screening examination/treatment/procedure(s) were performed by resident physician or non-physician practitioner and as supervising physician I was immediately available for consultation/collaboration.  Erin Honig, MD     Erin J Honig, MD 09/09/13 1548 

## 2013-09-09 NOTE — Discharge Instructions (Signed)
Migraine Headache A migraine headache is an intense, throbbing pain on one or both sides of your head. A migraine can last for 30 minutes to several hours. CAUSES  The exact cause of a migraine headache is not always known. However, a migraine may be caused when nerves in the brain become irritated and release chemicals that cause inflammation. This causes pain. Certain things may also trigger migraines, such as:  Alcohol.  Smoking.  Stress.  Menstruation.  Aged cheeses.  Foods or drinks that contain nitrates, glutamate, aspartame, or tyramine.  Lack of sleep.  Chocolate.  Caffeine.  Hunger.  Physical exertion.  Fatigue.  Medicines used to treat chest pain (nitroglycerine), birth control pills, estrogen, and some blood pressure medicines. SIGNS AND SYMPTOMS  Pain on one or both sides of your head.  Pulsating or throbbing pain.  Severe pain that prevents daily activities.  Pain that is aggravated by any physical activity.  Nausea, vomiting, or both.  Dizziness.  Pain with exposure to bright lights, loud noises, or activity.  General sensitivity to bright lights, loud noises, or smells. Before you get a migraine, you may get warning signs that a migraine is coming (aura). An aura may include:  Seeing flashing lights.  Seeing bright spots, halos, or zig-zag lines.  Having tunnel vision or blurred vision.  Having feelings of numbness or tingling.  Having trouble talking.  Having muscle weakness. DIAGNOSIS  A migraine headache is often diagnosed based on:  Symptoms.  Physical exam.  A CT scan or MRI of your head. These imaging tests cannot diagnose migraines, but they can help rule out other causes of headaches. TREATMENT Medicines may be given for pain and nausea. Medicines can also be given to help prevent recurrent migraines.  HOME CARE INSTRUCTIONS  Only take over-the-counter or prescription medicines for pain or discomfort as directed by your  health care provider. The use of long-term narcotics is not recommended.  Lie down in a dark, quiet room when you have a migraine.  Keep a journal to find out what may trigger your migraine headaches. For example, write down:  What you eat and drink.  How much sleep you get.  Any change to your diet or medicines.  Limit alcohol consumption.  Quit smoking if you smoke.  Get 7-9 hours of sleep, or as recommended by your health care provider.  Limit stress.  Keep lights dim if bright lights bother you and make your migraines worse. SEEK IMMEDIATE MEDICAL CARE IF:   Your migraine becomes severe.  You have a fever.  You have a stiff neck.  You have vision loss.  You have muscular weakness or loss of muscle control.  You start losing your balance or have trouble walking.  You feel faint or pass out.  You have severe symptoms that are different from your first symptoms. MAKE SURE YOU:   Understand these instructions.  Will watch your condition.  Will get help right away if you are not doing well or get worse. Document Released: 02/07/2005 Document Revised: 11/28/2012 Document Reviewed: 10/15/2012 [facility] Patient Information 2015 [place], [place]. This information is not intended to replace advice given to you by your health care provider. Make sure you discuss any questions you have with your health care provider.  Tension Headache A tension headache is pain, pressure, or aching felt over the front and sides of the head. Tension headaches often come after stress, feeling worried (anxiety), or feeling sad or down for a while (depressed). HOME CARE  Only take medicine as told by your doctor.  Lie down in a dark, quiet room when you have a headache.  Keep a journal to find out if certain things bring on headaches. For example, write down:  What you eat and drink.  How much sleep you get.  Any change to your diet or medicines.  Relax by getting a massage or doing  other relaxing activities.  Put ice or heat packs on the head and neck area as told by your doctor.  Lessen stress.  Sit up straight. Do not tighten (tense) your muscles.  Quit smoking if you smoke.  Lessen how much alcohol you drink.  Lessen how much caffeine you drink, or stop drinking caffeine.  Eat and exercise regularly.  Get enough sleep.  Avoid using too much pain medicine. GET HELP RIGHT AWAY IF:   Your headache becomes really bad.  You have a fever.  You have a stiff neck.  You have trouble seeing.  Your muscles are weak, or you lose muscle control.  You lose your balance or have trouble walking.  You feel like you will pass out (faint), or you pass out.  You have really bad symptoms that are different than your first symptoms.  You have problems with the medicines given to you by your doctor.  Your medicines do not work.  Your headache feels different than the other headaches.  You feel sick to your stomach (nauseous) or throw up (vomit). MAKE SURE YOU:   Understand these instructions.  Will watch your condition.  Will get help right away if you are not doing well or get worse. Document Released: 05/04/2009 Document Revised: 05/02/2011 Document Reviewed: 01/28/2011 [facility] Patient Information 2015 [place], [place]. This information is not intended to replace advice given to you by your health care provider. Make sure you discuss any questions you have with your health care provider.

## 2013-12-31 ENCOUNTER — Emergency Department (HOSPITAL_COMMUNITY): Payer: Medicaid Other

## 2013-12-31 ENCOUNTER — Encounter (HOSPITAL_COMMUNITY): Payer: Self-pay

## 2013-12-31 ENCOUNTER — Emergency Department (HOSPITAL_COMMUNITY)
Admission: EM | Admit: 2013-12-31 | Discharge: 2013-12-31 | Disposition: A | Discharge status: Home / Self Care | Payer: Medicaid Other | Attending: Emergency Medicine | Admitting: Emergency Medicine

## 2013-12-31 DIAGNOSIS — Z880 Allergy status to penicillin: Secondary | ICD-10-CM | POA: Diagnosis not present

## 2013-12-31 DIAGNOSIS — Z862 Personal history of diseases of the blood and blood-forming organs and certain disorders involving the immune mechanism: Secondary | ICD-10-CM | POA: Insufficient documentation

## 2013-12-31 DIAGNOSIS — R0789 Other chest pain: Secondary | ICD-10-CM | POA: Diagnosis not present

## 2013-12-31 DIAGNOSIS — R059 Cough, unspecified: Secondary | ICD-10-CM

## 2013-12-31 DIAGNOSIS — Z79899 Other long term (current) drug therapy: Secondary | ICD-10-CM | POA: Diagnosis not present

## 2013-12-31 DIAGNOSIS — I10 Essential (primary) hypertension: Secondary | ICD-10-CM | POA: Insufficient documentation

## 2013-12-31 DIAGNOSIS — J069 Acute upper respiratory infection, unspecified: Secondary | ICD-10-CM | POA: Diagnosis not present

## 2013-12-31 DIAGNOSIS — Z720 Tobacco use: Secondary | ICD-10-CM | POA: Insufficient documentation

## 2013-12-31 DIAGNOSIS — R079 Chest pain, unspecified: Secondary | ICD-10-CM

## 2013-12-31 DIAGNOSIS — R000 Tachycardia, unspecified: Secondary | ICD-10-CM | POA: Insufficient documentation

## 2013-12-31 DIAGNOSIS — R05 Cough: Secondary | ICD-10-CM

## 2013-12-31 DIAGNOSIS — R0602 Shortness of breath: Secondary | ICD-10-CM | POA: Diagnosis present

## 2013-12-31 LAB — BASIC METABOLIC PANEL
ANION GAP: 14 (ref 5–15)
BUN: 7 mg/dL (ref 6–23)
CHLORIDE: 102 meq/L (ref 96–112)
CO2: 24 meq/L (ref 19–32)
Calcium: 8.9 mg/dL (ref 8.4–10.5)
Creatinine, Ser: 0.57 mg/dL (ref 0.50–1.10)
GFR calc Af Amer: >90 mL/min (ref >90)
GFR calc non Af Amer: >90 mL/min (ref >90)
GLUCOSE: 119 mg/dL — AB (ref 70–99)
Potassium: 3.3 mEq/L — ABNORMAL LOW (ref 3.7–5.3)
SODIUM: 140 meq/L (ref 137–147)

## 2013-12-31 LAB — CBC
HCT: 36.4 % (ref 36.0–46.0)
HEMOGLOBIN: 12.2 g/dL (ref 12.0–15.0)
MCH: 28.4 pg (ref 26.0–34.0)
MCHC: 33.5 g/dL (ref 30.0–36.0)
MCV: 84.8 fL (ref 78.0–100.0)
Platelets: 274 10*3/uL (ref 150–400)
RBC: 4.29 MIL/uL (ref 3.87–5.11)
RDW: 12.9 % (ref 11.5–15.5)
WBC: 7.2 10*3/uL (ref 4.0–10.5)

## 2013-12-31 LAB — I-STAT TROPONIN, ED: TROPONIN I, POC: 0.01 ng/mL (ref 0.00–0.08)

## 2013-12-31 LAB — D-DIMER, QUANTITATIVE (NOT AT ARMC): D DIMER QUANT: 0.55 ug{FEU}/mL — AB (ref 0.00–0.48)

## 2013-12-31 MED ORDER — KETOROLAC TROMETHAMINE 30 MG/ML IJ SOLN
30.0000 mg | Freq: Once | INTRAMUSCULAR | Status: AC
  Administered 2013-12-31: 30 mg via INTRAVENOUS
  Filled 2013-12-31: qty 1

## 2013-12-31 MED ORDER — IOHEXOL 350 MG/ML SOLN
100.0000 mL | Freq: Once | INTRAVENOUS | Status: AC | PRN
  Administered 2013-12-31: 100 mL via INTRAVENOUS

## 2013-12-31 MED ORDER — SODIUM CHLORIDE 0.9 % IV BOLUS (SEPSIS)
1000.0000 mL | Freq: Once | INTRAVENOUS | Status: AC
  Administered 2013-12-31: 1000 mL via INTRAVENOUS

## 2013-12-31 MED ORDER — HYDROCODONE-ACETAMINOPHEN 5-325 MG PO TABS: 1.0000 | ORAL_TABLET | Freq: Four times a day (QID) | ORAL | Status: DC | PRN

## 2013-12-31 MED ORDER — HYDROCODONE-ACETAMINOPHEN 5-325 MG PO TABS
2.0000 | ORAL_TABLET | Freq: Once | ORAL | Status: AC
  Administered 2013-12-31: 2 via ORAL
  Filled 2013-12-31: qty 2

## 2013-12-31 MED ORDER — BENZONATATE 100 MG PO CAPS: 100.0000 mg | ORAL_CAPSULE | Freq: Three times a day (TID) | ORAL | Status: DC | PRN

## 2013-12-31 NOTE — Discharge Instructions (Signed)
Chest Wall Pain °Chest wall pain is pain in or around the bones and muscles of your chest. It may take up to 6 weeks to get better. It may take longer if you must stay physically active in your work and activities.  °CAUSES  °Chest wall pain may happen on its own. However, it may be caused by: °· A viral illness like the flu. °· Injury. °· Coughing. °· Exercise. °· Arthritis. °· Fibromyalgia. °· Shingles. °HOME CARE INSTRUCTIONS  °· Avoid overtiring physical activity. Try not to strain or perform activities that cause pain. This includes any activities using your chest or your abdominal and side muscles, especially if heavy weights are used. °· Put ice on the sore area. °¨ Put ice in a plastic bag. °¨ Place a towel between your skin and the bag. °¨ Leave the ice on for 15-20 minutes per hour while awake for the first 2 days. °· Only take over-the-counter or prescription medicines for pain, discomfort, or fever as directed by your caregiver. °SEEK IMMEDIATE MEDICAL CARE IF:  °· Your pain increases, or you are very uncomfortable. °· You have a fever. °· Your chest pain becomes worse. °· You have new, unexplained symptoms. °· You have nausea or vomiting. °· You feel sweaty or lightheaded. °· You have a cough with phlegm (sputum), or you cough up blood. °MAKE SURE YOU:  °· Understand these instructions. °· Will watch your condition. °· Will get help right away if you are not doing well or get worse. °Document Released: 02/07/2005 Document Revised: 05/02/2011 Document Reviewed: 10/04/2010 °ExitCare® Patient Information ©2015 [employer]. This information is not intended to replace advice given to you by your health care provider. Make sure you discuss any questions you have with your health care provider. ° °Cough, Adult ° A cough is a reflex that helps clear your throat and airways. It can help heal the body or may be a reaction to an irritated airway. A cough may only last 2 or 3 weeks (acute) or may last more than 8  weeks (chronic).  °CAUSES °Acute cough: °· Viral or bacterial infections. °Chronic cough: °· Infections. °· Allergies. °· Asthma. °· Post-nasal drip. °· Smoking. °· Heartburn or acid reflux. °· Some medicines. °· Chronic lung problems (COPD). °· Cancer. °SYMPTOMS  °· Cough. °· Fever. °· Chest pain. °· Increased breathing rate. °· High-pitched whistling sound when breathing (wheezing). °· Colored mucus that you cough up (sputum). °TREATMENT  °· A bacterial cough may be treated with antibiotic medicine. °· A viral cough must run its course and will not respond to antibiotics. °· Your caregiver may recommend other treatments if you have a chronic cough. °HOME CARE INSTRUCTIONS  °· Only take over-the-counter or prescription medicines for pain, discomfort, or fever as directed by your caregiver. Use cough suppressants only as directed by your caregiver. °· Use a cold steam vaporizer or humidifier in your bedroom or home to help loosen secretions. °· Sleep in a semi-upright position if your cough is worse at night. °· Rest as needed. °· Stop smoking if you smoke. °SEEK IMMEDIATE MEDICAL CARE IF:  °· You have pus in your sputum. °· Your cough starts to worsen. °· You cannot control your cough with suppressants and are losing sleep. °· You begin coughing up blood. °· You have difficulty breathing. °· You develop pain which is getting worse or is uncontrolled with medicine. °· You have a fever. °MAKE SURE YOU:  °· Understand these instructions. °· Will watch your   condition. °· Will get help right away if you are not doing well or get worse. °Document Released: 08/06/2010 Document Revised: 05/02/2011 Document Reviewed: 08/06/2010 °ExitCare® Patient Information ©2015 [employer]. This information is not intended to replace advice given to you by your health care provider. Make sure you discuss any questions you have with your health care provider. ° °

## 2013-12-31 NOTE — ED Notes (Signed)
C/o cough onset yest c/o left chest pain wrong with cough and movement. States in 2009 was dx. With nodule in her lung however wasn't able to follow up because she lost her medicaid. Cough is nonproductive., .

## 2013-12-31 NOTE — ED Notes (Signed)
Pt has a hx of lung nodule on the left side and is having the same pain as she did before. Woke up this morning and had some SOB.

## 2013-12-31 NOTE — ED Provider Notes (Signed)
CSN: [id number]     Arrival date & time 12/31/13  [id number] History   First MD Initiated Contact with Patient 12/31/13 1013     Chief Complaint  Patient presents with  . Shortness of Breath     (Consider location/radiation/quality/duration/timing/severity/associated sxs/prior Treatment) HPI  43 year old female presents with chest pain that has been constant over the last 24 hours. Feels like a pressure myxoma standing on her chest. Hurts to breathe and move her left arm. The pain is mostly left-sided. It is a 10 out of 10. His been constant since yesterday but severely worsened this morning. This feels similar to when she was here in 2009 and had a CT scan that ended up only showing a lung nausea. Since she knew she had a lung nodule she was concerned and wanted get checked out again as this feels similar. She has had a nonproductive cough for the past 2 days as well as nasal congestion. No fevers. No leg swelling or leg pain. No prior history of DVT or PE. No recent immobilization or surgeries.  Past Medical History  Diagnosis Date  . Hypertension   . Migraine headache   . Lung nodule   . Tobacco use   . Anemia    Past Surgical History  Procedure Laterality Date  . Abdominal hysterectomy     Family History  Problem Relation Age of Onset  . Breast cancer Mother    History  Substance Use Topics  . Smoking status: Current Every Day Smoker -- 0.50 packs/day for 19 years    Types: Cigarettes  . Smokeless tobacco: Not on file  . Alcohol Use: No   OB History    No data available     Review of Systems  Constitutional: Negative for fever.  HENT: Positive for congestion.   Respiratory: Positive for cough and shortness of breath.   Cardiovascular: Positive for chest pain. Negative for leg swelling.  Gastrointestinal: Negative for vomiting.  All other systems reviewed and are negative.     Allergies  Ciprofloxacin; Morphine and related; and Penicillins  Home Medications    Prior to Admission medications   Medication Sig Start Date End Date Taking? Authorizing Provider  ketorolac (TORADOL) 10 MG tablet Take 1 tablet (10 mg total) by mouth 4 (four) times daily as needed. 09/09/13   Hayden Rasmussenavid Mabe, NP  lisinopril-hydrochlorothiazide (PRINZIDE,ZESTORETIC) 10-12.5 MG per tablet Take 1 tablet by mouth daily.    Historical Provider, MD  ondansetron (ZOFRAN) 4 MG tablet Take 1 tablet (4 mg total) by mouth every 6 (six) hours. 09/09/13   Hayden Rasmussenavid Mabe, NP   BP 138/81 mmHg  Pulse 108  Temp(Src) 98.3 F (36.8 C) (Oral)  Resp 18  SpO2 97% Physical Exam  Constitutional: She is oriented to person, place, and time. She appears well-developed and well-nourished. No distress.  HENT:  Head: Normocephalic and atraumatic.  Right Ear: External ear normal.  Left Ear: External ear normal.  Nose: Nose normal.  Eyes: Right eye exhibits no discharge. Left eye exhibits no discharge.  Cardiovascular: Regular rhythm and normal heart sounds.  Tachycardia present.   Pulmonary/Chest: Effort normal and breath sounds normal. She has no wheezes. She has no rales. She exhibits tenderness.  Abdominal: Soft. She exhibits no distension. There is no tenderness.  Neurological: She is alert and oriented to person, place, and time.  Skin: Skin is warm and dry. She is not diaphoretic.  Nursing note and vitals reviewed.   ED Course  Procedures (including critical care  time) Labs Review Labs Reviewed  BASIC METABOLIC PANEL - Abnormal; Notable for the following:    Potassium 3.3 (*)    Glucose, Bld 119 (*)    All other components within normal limits  D-DIMER, QUANTITATIVE - Abnormal; Notable for the following:    D-Dimer, Quant 0.55 (*)    All other components within normal limits  CBC  I-STAT TROPOININ, ED    Imaging Review Dg Chest 2 View  12/31/2013   CLINICAL DATA:  Left chest pain for several days.  EXAM: CHEST  2 VIEW  COMPARISON:  02/06/2012  FINDINGS: Subtle asymmetric density at  the left base is not confirmed in the lateral projection. There is no edema, consolidation, effusion, or pneumothorax. Normal heart size and mediastinal contours. No osseous findings to explain chest pain.  IMPRESSION: No active cardiopulmonary disease.   Electronically Signed   By: Tiburcio PeaJonathan  Watts M.D.   On: 12/31/2013 10:21   Ct Angio Chest Pe W/cm &/or Wo Cm  12/31/2013   CLINICAL DATA:  43YOF pt has a hx of lung nodule on the left side and is having the same pain as she did before. Woke up this morning and had some SOB.  EXAM: CT ANGIOGRAPHY CHEST WITH CONTRAST  TECHNIQUE: Multidetector CT imaging of the chest was performed using the standard protocol during bolus administration of intravenous contrast. Multiplanar CT image reconstructions and MIPs were obtained to evaluate the vascular anatomy.  CONTRAST:  100mL OMNIPAQUE IOHEXOL 350 MG/ML SOLN  COMPARISON:  Current chest radiograph.  Chest CT, 06/09/2011.  FINDINGS: No evidence of a pulmonary embolus.  Heart, great vessels, mediastinum and hila are normal. No neck base or axillary masses or adenopathy.  3 mm irregular nodular opacity in the peripheral left lower lobe is unchanged. No lung consolidation or edema. No pleural effusion or pneumothorax.  Limited evaluation of the upper abdomen is unremarkable.  No significant bony abnormality.  Review of the MIP images confirms the above findings.  IMPRESSION: 1. No evidence of a pulmonary embolus. 2. No acute findings. 3. 3 mm left lower lobe pulmonary nodule, benign and stable from prior exams.   Electronically Signed   By: Amie Portlandavid  Ormond M.D.   On: 12/31/2013 12:34     EKG Interpretation   Date/Time:  Tuesday December 31 2013 09:56:52 EST Ventricular Rate:  108 PR Interval:  154 QRS Duration: 62 QT Interval:  344 QTC Calculation: 460 R Axis:   79 Text Interpretation:  Sinus tachycardia with Premature ventricular  complexes or Fusion complexes Otherwise normal ECG besides tachycardia, no  change  Confirmed by Nissan Frazzini  MD, Chivon Lepage ([id number]) on 12/31/2013 10:38:08 AM      MDM   Final diagnoses:  Cough  Chest wall pain  Upper respiratory infection    Chest pain appears to be due to chest wall etiology from an upper respiratory infection. Pulmonary embolism is ruled out by CT. Lower left lobe nodule is unchanged and this was relayed to patient. Patient is otherwise well appearing and pain is controlled. No evidence of pneumonia. We'll treat cough and pain and recommend follow-up as an outpatient.    Audree CamelScott T Kindra Bickham, MD 12/31/13 [phone]

## 2013-12-31 NOTE — ED Notes (Signed)
Pt returned from CT °

## 2013-12-31 NOTE — ED Notes (Signed)
Patient to CT at this time

## 2014-01-01 ENCOUNTER — Encounter (HOSPITAL_COMMUNITY): Payer: Self-pay | Admitting: *Deleted

## 2014-01-01 ENCOUNTER — Emergency Department (HOSPITAL_COMMUNITY): Payer: Medicaid Other

## 2014-01-01 ENCOUNTER — Emergency Department (HOSPITAL_COMMUNITY)
Admission: EM | Admit: 2014-01-01 | Discharge: 2014-01-01 | Disposition: A | Discharge status: Home / Self Care | Payer: Medicaid Other | Attending: Emergency Medicine | Admitting: Emergency Medicine

## 2014-01-01 DIAGNOSIS — R079 Chest pain, unspecified: Secondary | ICD-10-CM | POA: Diagnosis not present

## 2014-01-01 DIAGNOSIS — I10 Essential (primary) hypertension: Secondary | ICD-10-CM | POA: Insufficient documentation

## 2014-01-01 DIAGNOSIS — M549 Dorsalgia, unspecified: Secondary | ICD-10-CM | POA: Insufficient documentation

## 2014-01-01 DIAGNOSIS — Z720 Tobacco use: Secondary | ICD-10-CM | POA: Diagnosis not present

## 2014-01-01 DIAGNOSIS — Z79899 Other long term (current) drug therapy: Secondary | ICD-10-CM | POA: Diagnosis not present

## 2014-01-01 DIAGNOSIS — R05 Cough: Secondary | ICD-10-CM | POA: Insufficient documentation

## 2014-01-01 DIAGNOSIS — Z862 Personal history of diseases of the blood and blood-forming organs and certain disorders involving the immune mechanism: Secondary | ICD-10-CM | POA: Insufficient documentation

## 2014-01-01 DIAGNOSIS — Z880 Allergy status to penicillin: Secondary | ICD-10-CM | POA: Insufficient documentation

## 2014-01-01 LAB — CBC WITH DIFFERENTIAL/PLATELET
Basophils Absolute: 0.1 10*3/uL (ref 0.0–0.1)
Basophils Relative: 1 % (ref 0–1)
Eosinophils Absolute: 0.1 10*3/uL (ref 0.0–0.7)
Eosinophils Relative: 2 % (ref 0–5)
HEMATOCRIT: 37 % (ref 36.0–46.0)
Hemoglobin: 12.5 g/dL (ref 12.0–15.0)
Lymphocytes Relative: 38 % (ref 12–46)
Lymphs Abs: 2.1 10*3/uL (ref 0.7–4.0)
MCH: 28.2 pg (ref 26.0–34.0)
MCHC: 33.8 g/dL (ref 30.0–36.0)
MCV: 83.3 fL (ref 78.0–100.0)
Monocytes Absolute: 0.4 10*3/uL (ref 0.1–1.0)
Monocytes Relative: 7 % (ref 3–12)
NEUTROS PCT: 52 % (ref 43–77)
Neutro Abs: 2.7 10*3/uL (ref 1.7–7.7)
Platelets: 285 10*3/uL (ref 150–400)
RBC: 4.44 MIL/uL (ref 3.87–5.11)
RDW: 12.9 % (ref 11.5–15.5)
WBC: 5.4 10*3/uL (ref 4.0–10.5)

## 2014-01-01 LAB — I-STAT CHEM 8, ED
BUN: 6 mg/dL (ref 6–23)
Calcium, Ion: 1.16 mmol/L (ref 1.12–1.23)
Chloride: 104 mEq/L (ref 96–112)
Creatinine, Ser: 0.6 mg/dL (ref 0.50–1.10)
GLUCOSE: 105 mg/dL — AB (ref 70–99)
HEMATOCRIT: 42 % (ref 36.0–46.0)
HEMOGLOBIN: 14.3 g/dL (ref 12.0–15.0)
Potassium: 3.5 mEq/L — ABNORMAL LOW (ref 3.7–5.3)
Sodium: 140 mEq/L (ref 137–147)
TCO2: 22 mmol/L (ref 0–100)

## 2014-01-01 LAB — I-STAT TROPONIN, ED: TROPONIN I, POC: 0.01 ng/mL (ref 0.00–0.08)

## 2014-01-01 MED ORDER — CYCLOBENZAPRINE HCL 10 MG PO TABS
10.0000 mg | ORAL_TABLET | Freq: Two times a day (BID) | ORAL | Status: DC | PRN
Start: 2014-01-01 — End: 2014-04-29

## 2014-01-01 MED ORDER — DIAZEPAM 2 MG PO TABS
2.0000 mg | ORAL_TABLET | Freq: Once | ORAL | Status: AC
  Administered 2014-01-01: 2 mg via ORAL
  Filled 2014-01-01: qty 1

## 2014-01-01 MED ORDER — NAPROXEN 500 MG PO TABS: 500.0000 mg | ORAL_TABLET | Freq: Two times a day (BID) | ORAL | Status: DC

## 2014-01-01 MED ORDER — IPRATROPIUM-ALBUTEROL 0.5-2.5 (3) MG/3ML IN SOLN
3.0000 mL | Freq: Once | RESPIRATORY_TRACT | Status: AC
  Administered 2014-01-01: 3 mL via RESPIRATORY_TRACT
  Filled 2014-01-01: qty 3

## 2014-01-01 MED ORDER — KETOROLAC TROMETHAMINE 30 MG/ML IJ SOLN
30.0000 mg | Freq: Once | INTRAMUSCULAR | Status: AC
  Administered 2014-01-01: 30 mg via INTRAVENOUS
  Filled 2014-01-01: qty 1

## 2014-01-01 MED ORDER — ALBUTEROL SULFATE HFA 108 (90 BASE) MCG/ACT IN AERS: 2.0000 | INHALATION_SPRAY | RESPIRATORY_TRACT | Status: DC | PRN

## 2014-01-01 NOTE — ED Notes (Signed)
Pt reports being seen here yesterday for same. Having productive cough, chest pain and back pain. Pain increases with movement and breathing. ekg done at triage.

## 2014-01-01 NOTE — ED Notes (Signed)
Pt presents with midsternal cp that radiates straight through to back. Pt states that she has had a nonproductive cough X 2 days and the chest pain started yesterday. Pt describes it as "sharp".  Condition is acute in nature. Condition is made worse by 'talking, breathing and moving the left of my body" condition is made better by nothing. Pt states that she was seen at this facility yesterday and diagnosed with "chest wall pain" Pt denies relief from Norco 2 tablets at 4:30am. Pt denies any N/V. Pt is able to speak in full sentences.

## 2014-01-01 NOTE — ED Notes (Signed)
NP at bedside.

## 2014-01-01 NOTE — Discharge Instructions (Signed)
Please follow the directions provided. Be sure to follow-up with your primary care provider to ensure you're getting better. He may use the albuterol inhaler 2 puffs every 4 hours for shortness of breath, cough or chest tightness. He may use the naproxen twice a day to help with pain and inflammation. Use the Flexeril as directed to help relax muscles. Don't hesitate to return for any new, worsening, or concerning symptoms.  SEEK IMMEDIATE MEDICAL CARE IF:  Your pain increases, or you are very uncomfortable.  You have a fever.  Your chest pain becomes worse.  You have new, unexplained symptoms.  You have nausea or vomiting.  You feel sweaty or lightheaded.  You have a cough with phlegm (sputum), or you cough up blood.

## 2014-01-01 NOTE — ED Provider Notes (Signed)
CSN: [id number]     Arrival date & time 01/01/14  [id number] History   First MD Initiated Contact with Patient 01/01/14 [phone]     Chief Complaint  Patient presents with  . Chest Pain  . Back Pain   (Consider location/radiation/quality/duration/timing/severity/associated sxs/prior Treatment) HPI  Rachel Fischer is a 43 yo female presenting with continued chest pain.  She reports cough x 3-4 and subsequent chest pain in her left chest that feels like it is "pulling around to my back".  She was seen in the ED yesterday and had EKG, labs, CXR and CT, she was discharged home with tessalon perles and norco, but continues to feel this chest pain that is worsened with movement and palpation.  She also continues to cough. She does endorse smoking 8 cigarettes a day.  She denies fever, hemoptysis, nausea, vomiting, or unilateral leg swelling.     Past Medical History  Diagnosis Date  . Hypertension   . Migraine headache   . Lung nodule   . Tobacco use   . Anemia    Past Surgical History  Procedure Laterality Date  . Abdominal hysterectomy     Family History  Problem Relation Age of Onset  . Breast cancer Mother    History  Substance Use Topics  . Smoking status: Current Every Day Smoker -- 0.50 packs/day for 19 years    Types: Cigarettes  . Smokeless tobacco: Not on file  . Alcohol Use: No   OB History    No data available     Review of Systems  Constitutional: Negative for fever and chills.  HENT: Negative for sore throat.   Eyes: Negative for visual disturbance.  Respiratory: Positive for cough and shortness of breath.   Cardiovascular: Positive for chest pain. Negative for leg swelling.  Gastrointestinal: Negative for nausea, vomiting and diarrhea.  Genitourinary: Negative for dysuria.  Musculoskeletal: Negative for myalgias.  Skin: Negative for rash.  Neurological: Negative for weakness, numbness and headaches.    Allergies  Ciprofloxacin; Morphine and related; and  Penicillins  Home Medications   Prior to Admission medications   Medication Sig Start Date End Date Taking? Authorizing Provider  benzonatate (TESSALON) 100 MG capsule Take 1 capsule (100 mg total) by mouth 3 (three) times daily as needed for cough. 12/31/13   Audree CamelScott T Goldston, MD  HYDROcodone-acetaminophen (NORCO) 5-325 MG per tablet Take 1 tablet by mouth every 6 (six) hours as needed for severe pain. 12/31/13   Audree CamelScott T Goldston, MD  ibuprofen (ADVIL,MOTRIN) 800 MG tablet Take 800 mg by mouth every 8 (eight) hours as needed (pain).    Historical Provider, MD  Ibuprofen-Diphenhydramine HCl (ADVIL PM) 200-25 MG CAPS Take 1 capsule by mouth at bedtime as needed (sleep).    Historical Provider, MD  ketorolac (TORADOL) 10 MG tablet Take 1 tablet (10 mg total) by mouth 4 (four) times daily as needed. Patient not taking: Reported on 12/31/2013 09/09/13   Hayden Rasmussenavid Mabe, NP  lisinopril-hydrochlorothiazide (PRINZIDE,ZESTORETIC) 10-12.5 MG per tablet Take 1 tablet by mouth daily.    Historical Provider, MD  ondansetron (ZOFRAN) 4 MG tablet Take 1 tablet (4 mg total) by mouth every 6 (six) hours. Patient not taking: Reported on 12/31/2013 09/09/13   Hayden Rasmussenavid Mabe, NP   BP 155/91 mmHg  Pulse 130  Temp(Src) 98.1 F (36.7 C) (Oral)  Resp 24  Ht 5' (1.524 m)  Wt 130 lb (58.968 kg)  BMI 25.39 kg/m2  SpO2 100% Physical Exam  Constitutional: She appears  well-developed and well-nourished. No distress.  HENT:  Head: Normocephalic and atraumatic.  Mouth/Throat: Oropharynx is clear and moist. No oropharyngeal exudate.  Eyes: Conjunctivae are normal.  Neck: Neck supple. No thyromegaly present.  Cardiovascular: Normal rate, regular rhythm and intact distal pulses.  Exam reveals no gallop and no friction rub.   No murmur heard. Pulmonary/Chest: Effort normal and breath sounds normal. No respiratory distress. She has no wheezes. She has no rhonchi. She has no rales.   She exhibits tenderness.    Abdominal:  Soft. There is no tenderness.  Musculoskeletal: She exhibits no tenderness.  Lymphadenopathy:    She has no cervical adenopathy.  Neurological: She is alert.  Skin: Skin is warm and dry. No rash noted. She is not diaphoretic.  Psychiatric: She has a normal mood and affect.  Nursing note and vitals reviewed.   ED Course  Procedures (including critical care time) Labs Review Labs Reviewed  I-STAT CHEM 8, ED - Abnormal; Notable for the following:    Potassium 3.5 (*)    Glucose, Bld 105 (*)    All other components within normal limits  CBC WITH DIFFERENTIAL  I-STAT TROPOININ, ED    Imaging Review Dg Chest 2 View  12/31/2013   CLINICAL DATA:  Left chest pain for several days.  EXAM: CHEST  2 VIEW  COMPARISON:  02/06/2012  FINDINGS: Subtle asymmetric density at the left base is not confirmed in the lateral projection. There is no edema, consolidation, effusion, or pneumothorax. Normal heart size and mediastinal contours. No osseous findings to explain chest pain.  IMPRESSION: No active cardiopulmonary disease.   Electronically Signed   By: Tiburcio Pea M.D.   On: 12/31/2013 10:21   Ct Angio Chest Pe W/cm &/or Wo Cm  12/31/2013   CLINICAL DATA:  43YOF pt has a hx of lung nodule on the left side and is having the same pain as she did before. Woke up this morning and had some SOB.  EXAM: CT ANGIOGRAPHY CHEST WITH CONTRAST  TECHNIQUE: Multidetector CT imaging of the chest was performed using the standard protocol during bolus administration of intravenous contrast. Multiplanar CT image reconstructions and MIPs were obtained to evaluate the vascular anatomy.  CONTRAST:  OMNIPAQUE IOHEXOL 350 MG/ML SOLN  COMPARISON:  Current chest radiograph.  Chest CT, 06/09/2011.  FINDINGS: No evidence of a pulmonary embolus.  Heart, great vessels, mediastinum and hila are normal. No neck base or axillary masses or adenopathy.  3 mm irregular nodular opacity in the peripheral left lower lobe is  unchanged. No lung consolidation or edema. No pleural effusion or pneumothorax.  Limited evaluation of the upper abdomen is unremarkable.  No significant bony abnormality.  Review of the MIP images confirms the above findings.  IMPRESSION: 1. No evidence of a pulmonary embolus. 2. No acute findings. 3. 3 mm left lower lobe pulmonary nodule, benign and stable from prior exams.   Electronically Signed   By: Amie Portland M.D.   On: 12/31/2013 12:34     EKG Interpretation None      MDM   Final diagnoses:  Chest pain   Vitals on my initial exam: 153/93 (107), 71, 18, 100% RA, 98.7  43 yo female presenting with continued cp. Non-concerning for acute cardiac or pulmonary etiology in light of negative work-up done yesterday.  Will attempt symptom mgmt, albuterol neb, toradol and valium.  Discussed case with Dr. Romeo Apple.  Will repeat cardiac work-up for comparison and reassurance.     Labs  and xray reviewed and negative for acute abnormality.  Pt reports feeling better after neb tx and medicines and feels able to go home.  Discharge instructions include prescription for albuterol MDI, anti-inflammatory and muscle relaxant and follow-up with PCP. Pt aware of plan and in agreement.  Return precautions provided.    Filed Vitals:   01/01/14 0732 01/01/14 0807 01/01/14 0815  BP: 155/91  170/82  Pulse: 130  70  Temp: 98.1 F (36.7 C)    TempSrc: Oral    Resp: 24  18  Height: 5' (1.524 m)    Weight: 130 lb (58.968 kg)    SpO2: 100% 100% 100%   Meds given in ED:  Medications  ipratropium-albuterol (DUONEB) 0.5-2.5 (3) MG/3ML nebulizer solution 3 mL (3 mLs Nebulization Given 01/01/14 0802)  diazepam (VALIUM) tablet 2 mg (2 mg Oral Given 01/01/14 0805)  ketorolac (TORADOL) 30 MG/ML injection 30 mg (30 mg Intravenous Given 01/01/14 0840)    Discharge Medication List as of 01/01/2014  9:41 AM    START taking these medications   Details  albuterol (PROVENTIL HFA;VENTOLIN HFA) 108 (90 BASE)  MCG/ACT inhaler Inhale 2 puffs into the lungs every 4 (four) hours as needed for wheezing or shortness of breath., Starting 01/01/2014, Until Discontinued, Print    cyclobenzaprine (FLEXERIL) 10 MG tablet Take 1 tablet (10 mg total) by mouth 2 (two) times daily as needed for muscle spasms., Starting 01/01/2014, Until Discontinued, Print    naproxen (NAPROSYN) 500 MG tablet Take 1 tablet (500 mg total) by mouth 2 (two) times daily with a meal., Starting 01/01/2014, Until Discontinued, Print           Harle BattiestElizabeth Kayana Thoen, NP 01/02/14 1011  Purvis SheffieldForrest Harrison, MD 01/02/14 1046

## 2014-01-01 NOTE — ED Notes (Signed)
Patient transported to X-ray 

## 2014-02-06 ENCOUNTER — Encounter (HOSPITAL_COMMUNITY): Payer: Self-pay | Admitting: Cardiology

## 2014-02-06 ENCOUNTER — Emergency Department (HOSPITAL_COMMUNITY)
Admission: EM | Admit: 2014-02-06 | Discharge: 2014-02-06 | Disposition: A | Discharge status: Home / Self Care | Payer: Medicaid Other | Attending: Emergency Medicine | Admitting: Emergency Medicine

## 2014-02-06 DIAGNOSIS — G43909 Migraine, unspecified, not intractable, without status migrainosus: Secondary | ICD-10-CM | POA: Diagnosis not present

## 2014-02-06 DIAGNOSIS — R112 Nausea with vomiting, unspecified: Secondary | ICD-10-CM

## 2014-02-06 DIAGNOSIS — N390 Urinary tract infection, site not specified: Secondary | ICD-10-CM

## 2014-02-06 DIAGNOSIS — Z862 Personal history of diseases of the blood and blood-forming organs and certain disorders involving the immune mechanism: Secondary | ICD-10-CM | POA: Diagnosis not present

## 2014-02-06 DIAGNOSIS — Z880 Allergy status to penicillin: Secondary | ICD-10-CM | POA: Insufficient documentation

## 2014-02-06 DIAGNOSIS — Z791 Long term (current) use of non-steroidal anti-inflammatories (NSAID): Secondary | ICD-10-CM | POA: Insufficient documentation

## 2014-02-06 DIAGNOSIS — Z79899 Other long term (current) drug therapy: Secondary | ICD-10-CM | POA: Insufficient documentation

## 2014-02-06 DIAGNOSIS — R509 Fever, unspecified: Secondary | ICD-10-CM | POA: Insufficient documentation

## 2014-02-06 DIAGNOSIS — Z720 Tobacco use: Secondary | ICD-10-CM | POA: Insufficient documentation

## 2014-02-06 DIAGNOSIS — R5383 Other fatigue: Secondary | ICD-10-CM | POA: Diagnosis not present

## 2014-02-06 DIAGNOSIS — R197 Diarrhea, unspecified: Secondary | ICD-10-CM | POA: Insufficient documentation

## 2014-02-06 DIAGNOSIS — I10 Essential (primary) hypertension: Secondary | ICD-10-CM | POA: Diagnosis not present

## 2014-02-06 DIAGNOSIS — R109 Unspecified abdominal pain: Secondary | ICD-10-CM | POA: Diagnosis present

## 2014-02-06 LAB — URINE MICROSCOPIC-ADD ON

## 2014-02-06 LAB — URINALYSIS, ROUTINE W REFLEX MICROSCOPIC
BILIRUBIN URINE: NEGATIVE
Glucose, UA: NEGATIVE mg/dL
HGB URINE DIPSTICK: NEGATIVE
Ketones, ur: NEGATIVE mg/dL
Leukocytes, UA: NEGATIVE
Nitrite: POSITIVE — AB
PH: 8.5 — AB (ref 5.0–8.0)
Protein, ur: 100 mg/dL — AB
SPECIFIC GRAVITY, URINE: 1.027 (ref 1.005–1.030)
Urobilinogen, UA: 1 mg/dL (ref 0.0–1.0)

## 2014-02-06 LAB — COMPREHENSIVE METABOLIC PANEL
ALT: 11 U/L (ref 0–35)
AST: 16 U/L (ref 0–37)
Albumin: 3.9 g/dL (ref 3.5–5.2)
Alkaline Phosphatase: 55 U/L (ref 39–117)
Anion gap: 13 (ref 5–15)
BUN: 9 mg/dL (ref 6–23)
CO2: 23 mEq/L (ref 19–32)
Calcium: 9.1 mg/dL (ref 8.4–10.5)
Chloride: 101 mEq/L (ref 96–112)
Creatinine, Ser: 0.55 mg/dL (ref 0.50–1.10)
GFR calc non Af Amer: >90 mL/min (ref >90)
GLUCOSE: 99 mg/dL (ref 70–99)
Potassium: 3.8 mEq/L (ref 3.7–5.3)
Sodium: 137 mEq/L (ref 137–147)
TOTAL PROTEIN: 7.1 g/dL (ref 6.0–8.3)
Total Bilirubin: 0.5 mg/dL (ref 0.3–1.2)

## 2014-02-06 LAB — CBC WITH DIFFERENTIAL/PLATELET
Basophils Absolute: 0 10*3/uL (ref 0.0–0.1)
Basophils Relative: 0 % (ref 0–1)
EOS ABS: 0 10*3/uL (ref 0.0–0.7)
Eosinophils Relative: 0 % (ref 0–5)
HCT: 37.6 % (ref 36.0–46.0)
Hemoglobin: 12.7 g/dL (ref 12.0–15.0)
LYMPHS PCT: 10 % — AB (ref 12–46)
Lymphs Abs: 0.7 10*3/uL (ref 0.7–4.0)
MCH: 28.1 pg (ref 26.0–34.0)
MCHC: 33.8 g/dL (ref 30.0–36.0)
MCV: 83.2 fL (ref 78.0–100.0)
Monocytes Absolute: 0.5 10*3/uL (ref 0.1–1.0)
Monocytes Relative: 7 % (ref 3–12)
Neutro Abs: 5.6 10*3/uL (ref 1.7–7.7)
Neutrophils Relative %: 83 % — ABNORMAL HIGH (ref 43–77)
PLATELETS: 288 10*3/uL (ref 150–400)
RBC: 4.52 MIL/uL (ref 3.87–5.11)
RDW: 13.7 % (ref 11.5–15.5)
WBC: 6.7 10*3/uL (ref 4.0–10.5)

## 2014-02-06 LAB — LIPASE, BLOOD: Lipase: 16 U/L (ref 11–59)

## 2014-02-06 MED ORDER — DICYCLOMINE HCL 10 MG PO CAPS
20.0000 mg | ORAL_CAPSULE | Freq: Once | ORAL | Status: AC
  Administered 2014-02-06: 20 mg via ORAL
  Filled 2014-02-06: qty 2

## 2014-02-06 MED ORDER — FENTANYL CITRATE 0.05 MG/ML IJ SOLN
50.0000 ug | Freq: Once | INTRAMUSCULAR | Status: AC
  Administered 2014-02-06: 50 ug via INTRAVENOUS
  Filled 2014-02-06: qty 2

## 2014-02-06 MED ORDER — ACETAMINOPHEN 325 MG PO TABS
650.0000 mg | ORAL_TABLET | Freq: Once | ORAL | Status: AC
  Administered 2014-02-06: 650 mg via ORAL
  Filled 2014-02-06: qty 2

## 2014-02-06 MED ORDER — CEFTRIAXONE SODIUM 1 G IJ SOLR
1.0000 g | Freq: Once | INTRAMUSCULAR | Status: AC
  Administered 2014-02-06: 1 g via INTRAVENOUS
  Filled 2014-02-06: qty 10

## 2014-02-06 MED ORDER — SODIUM CHLORIDE 0.9 % IV BOLUS (SEPSIS)
1000.0000 mL | Freq: Once | INTRAVENOUS | Status: AC
  Administered 2014-02-06: 1000 mL via INTRAVENOUS

## 2014-02-06 MED ORDER — CEPHALEXIN 500 MG PO CAPS: 500.0000 mg | ORAL_CAPSULE | Freq: Three times a day (TID) | ORAL | Status: DC

## 2014-02-06 MED ORDER — DICYCLOMINE HCL 20 MG PO TABS: 20.0000 mg | ORAL_TABLET | Freq: Two times a day (BID) | ORAL | Status: DC

## 2014-02-06 MED ORDER — ONDANSETRON HCL 8 MG PO TABS: 8.0000 mg | ORAL_TABLET | ORAL | Status: DC | PRN

## 2014-02-06 MED ORDER — ONDANSETRON HCL 4 MG/2ML IJ SOLN
4.0000 mg | Freq: Once | INTRAMUSCULAR | Status: AC
  Administered 2014-02-06: 4 mg via INTRAVENOUS
  Filled 2014-02-06: qty 2

## 2014-02-06 NOTE — ED Notes (Signed)
patiet reports she continues to have headache.  Will give tylenol

## 2014-02-06 NOTE — Discharge Instructions (Signed)
Urinary Tract Infection Urinary tract infections (UTIs) can develop anywhere along your urinary tract. Your urinary tract is your body's drainage system for removing wastes and extra water. Your urinary tract includes two kidneys, two ureters, a bladder, and a urethra. Your kidneys are a pair of bean-shaped organs. Each kidney is about the size of your fist. They are located below your ribs, one on each side of your spine. CAUSES Infections are caused by microbes, which are microscopic organisms, including fungi, viruses, and bacteria. These organisms are so small that they can only be seen through a microscope. Bacteria are the microbes that most commonly cause UTIs. SYMPTOMS  Symptoms of UTIs may vary by age and gender of the patient and by the location of the infection. Symptoms in young women typically include a frequent and intense urge to urinate and a painful, burning feeling in the bladder or urethra during urination. Older women and men are more likely to be tired, shaky, and weak and have muscle aches and abdominal pain. A fever may mean the infection is in your kidneys. Other symptoms of a kidney infection include pain in your back or sides below the ribs, nausea, and vomiting. DIAGNOSIS To diagnose a UTI, your caregiver will ask you about your symptoms. Your caregiver also will ask to provide a urine sample. The urine sample will be tested for bacteria and white blood cells. White blood cells are made by your body to help fight infection. TREATMENT  Typically, UTIs can be treated with medication. Because most UTIs are caused by a bacterial infection, they usually can be treated with the use of antibiotics. The choice of antibiotic and length of treatment depend on your symptoms and the type of bacteria causing your infection. HOME CARE INSTRUCTIONS  If you were prescribed antibiotics, take them exactly as your caregiver instructs you. Finish the medication even if you feel better after you  have only taken some of the medication.  Drink enough water and fluids to keep your urine clear or pale yellow.  Avoid caffeine, tea, and carbonated beverages. They tend to irritate your bladder.  Empty your bladder often. Avoid holding urine for long periods of time.  Empty your bladder before and after sexual intercourse.  After a bowel movement, women should cleanse from front to back. Use each tissue only once. SEEK MEDICAL CARE IF:   You have back pain.  You develop a fever.  Your symptoms do not begin to resolve within 3 days. SEEK IMMEDIATE MEDICAL CARE IF:   You have severe back pain or lower abdominal pain.  You develop chills.  You have nausea or vomiting.  You have continued burning or discomfort with urination. MAKE SURE YOU:   Understand these instructions.  Will watch your condition.  Will get help right away if you are not doing well or get worse. Document Released: 11/17/2004 Document Revised: 08/09/2011 Document Reviewed: 03/18/2011 [facility] Patient Information 2015 [place]. This information is not intended to replace advice given to you by your health care provider. Make sure you discuss any questions you have with your health care provider.  Viral Gastroenteritis Viral gastroenteritis is also known as stomach flu. This condition affects the stomach and intestinal tract. It can cause sudden diarrhea and vomiting. The illness typically lasts 3 to 8 days. Most people develop an immune response that eventually gets rid of the virus. While this natural response develops, the virus can make you quite ill. CAUSES  Many different viruses can cause gastroenteritis,  such as rotavirus or noroviruses. You can catch one of these viruses by consuming contaminated food or water. You may also catch a virus by sharing utensils or other personal items with an infected person or by touching a contaminated surface. SYMPTOMS  The most common symptoms are diarrhea and  vomiting. These problems can cause a severe loss of body fluids (dehydration) and a body salt (electrolyte) imbalance. Other symptoms may include:  Fever.  Headache.  Fatigue.  Abdominal pain. DIAGNOSIS  Your caregiver can usually diagnose viral gastroenteritis based on your symptoms and a physical exam. A stool sample may also be taken to test for the presence of viruses or other infections. TREATMENT  This illness typically goes away on its own. Treatments are aimed at rehydration. The most serious cases of viral gastroenteritis involve vomiting so severely that you are not able to keep fluids down. In these cases, fluids must be given through an intravenous line (IV). HOME CARE INSTRUCTIONS   Drink enough fluids to keep your urine clear or pale yellow. Drink small amounts of fluids frequently and increase the amounts as tolerated.  Ask your caregiver for specific rehydration instructions.  Avoid:  Foods high in sugar.  Alcohol.  Carbonated drinks.  Tobacco.  Juice.  Caffeine drinks.  Extremely hot or cold fluids.  Fatty, greasy foods.  Too much intake of anything at one time.  Dairy products until 24 to 48 hours after diarrhea stops.  You may consume probiotics. Probiotics are active cultures of beneficial bacteria. They may lessen the amount and number of diarrheal stools in adults. Probiotics can be found in yogurt with active cultures and in supplements.  Wash your hands well to avoid spreading the virus.  Only take over-the-counter or prescription medicines for pain, discomfort, or fever as directed by your caregiver. Do not give aspirin to children. Antidiarrheal medicines are not recommended.  Ask your caregiver if you should continue to take your regular prescribed and over-the-counter medicines.  Keep all follow-up appointments as directed by your caregiver. SEEK IMMEDIATE MEDICAL CARE IF:   You are unable to keep fluids down.  You do not urinate at  least once every 6 to 8 hours.  You develop shortness of breath.  You notice blood in your stool or vomit. This may look like coffee grounds.  You have abdominal pain that increases or is concentrated in one small area (localized).  You have persistent vomiting or diarrhea.  You have a fever.  The patient is a child younger than 3 months, and he or she has a fever.  The patient is a child older than 3 months, and he or she has a fever and persistent symptoms.  The patient is a child older than 3 months, and he or she has a fever and symptoms suddenly get worse.  The patient is a baby, and he or she has no tears when crying. MAKE SURE YOU:   Understand these instructions.  Will watch your condition.  Will get help right away if you are not doing well or get worse. Document Released: 02/07/2005 Document Revised: 05/02/2011 Document Reviewed: 11/24/2010 [facility] Patient Information 2015 [place], [place]. This information is not intended to replace advice given to you by your health care provider. Make sure you discuss any questions you have with your health care provider.  Emergency Department Resource Guide 1) Find a Doctor and Pay Out of Pocket Although you won't have to find out who is covered by your insurance plan, it is  a good idea to ask around and get recommendations. You will then need to call the office and see if the doctor you have chosen will accept you as a new patient and what types of options they offer for patients who are self-pay. Some doctors offer discounts or will set up payment plans for their patients who do not have insurance, but you will need to ask so you aren't surprised when you get to your appointment.  2) Contact Your Local Health Department Not all health departments have doctors that can see patients for sick visits, but many do, so it is worth a call to see if yours does. If you don't know where your local health department is, you can check in your  phone book. The CDC also has a tool to help you locate your state's health department, and many state websites also have listings of all of their local health departments.  3) Find a Walk-in Clinic If your illness is not likely to be very severe or complicated, you may want to try a walk in clinic. These are popping up all over the country in pharmacies, drugstores, and shopping centers. They're usually staffed by nurse practitioners or physician assistants that have been trained to treat common illnesses and complaints. They're usually fairly quick and inexpensive. However, if you have serious medical issues or chronic medical problems, these are probably not your best option.  No Primary Care Doctor: - Call Health Connect at  [phone] - they can help you locate a primary care doctor that  accepts your insurance, provides certain services, etc. - Physician Referral Service- [phone]  Chronic Pain Problems: Organization         Address  Phone   Notes  [facility]  [phone] Patients need to be referred by their primary care doctor.   Medication Assistance: Organization         Address  Phone   Notes  [facility] Medication [facility] [address] [place] [place], [place] [address] [phone] --Must be a resident of [facility] -- Must have NO insurance coverage whatsoever (no Medicaid/ Medicare, etc.) -- The pt. MUST have a primary care doctor that directs their care regularly and follows them in the community   [facility]ist  [phone]   [employer]  [phone]    Agencies that provide inexpensive medical care: Organization         Address  Phone   Notes  [facility]  [phone]   [facility]    [phone]   [facility] [address] [place], [place] [address] [phone]   [facility] [address] [place] [place] [phone]   Planned  Parenthood    [phone]   [facility]    [phone]   [facility]  [address], [place] Phone:  [phone], Fax:  [phone] Hours of Operation:  9 am - 6 pm, M-F.  Also accepts Medicaid/Medicare and self-pay.  [facility]  [address], [place] Phone: [phone], Fax: [phone]. Hours of Operation:  8:30 am - 5:30 pm, M-F.  Also accepts Medicaid and self-pay.  [facility] [place] [address], [place] Phone: [phone]   [facility] [address] Natasha Bence [place], [place] [phone], Ext. [phone] Mondays & Thursdays: 7-9 AM.  First 15 patients are seen on a first come, first serve basis.    Medicaid-accepting [facility] Providers:  Organization         Address  Phone   Notes  [facility] [address], [place] [phone] Also accepts self-pay patients.  [facility] [address] Laurell Josephs [place], [place]  [phone]   [facility] [address], [place] [phone]   [facility] [address], [place] [phone]   Renaye Rakers [address], [place]   [phone] Only accepts [place] patients after they have their name applied to their card.   Self-Pay (no insurance) in [facility]:  Organization         Address  Phone   Notes  Sickle Cell Patients, [facility] [address] [place], [place] [phone]   [facility] [address] [place], [place] [phone]   [facility] [place]  [address], [place] [phone]   [facility]/Dr. Osei-Bonsu  [address], [place] or [address], [place] [phone] Phone number for both [place] and [place] locations is the same.    [facility]gent Medical and [facility] [address], [place] [phone]   [facility] [address] [place] or [address] [phone]   [facility] [address], [place] [phone], phone; [phone], fax Sees patients 1st and 3rd Saturday of every month.  Must not qualify for public or private insurance (i.e. Medicaid, Medicare, [place] Health Choice, Veterans' Benefits)  Household income should be no more than 200% of the poverty level The clinic cannot treat you if you are pregnant or think you are pregnant  Sexually transmitted diseases are not treated at the clinic.    Dental Care: Organization         Address  Phone  Notes  [facility] Department of [facility] [address] [place], [place] [phone] Accepts children up to age 29 who are enrolled in [place] or [place] Health Choice; pregnant women with a Medicaid card; and children who have applied for Medicaid or [place] Health Choice, but were declined, whose parents can pay a reduced fee at time of service.  [facility]  [address], [place] [phone] Accepts children up to age 108 who are enrolled in [place] or [place] Health Choice; pregnant women with a Medicaid card; and children who have applied for Medicaid or  Health Choice, but were declined, whose parents can pay a reduced fee at time of service.  [facility]  [address] [place], [place] [phone] Patients are seen by appointment only. Walk-ins are not accepted. [facility] will see patients 73 years of age and older. Monday - Tuesday (8am-5pm) Most Wednesdays (8:30-5pm) $30 per visit, cash only  [facility] Adult Dental Access PRO[facility]  [address], [place] [phone] Patients are seen by appointment only. Walk-ins are not accepted. [facility] will see patients 6  years of age and older. One Wednesday Evening (Monthly: Volunteer Based).  $30 per visit, cash only  [employer]  [phone] for adults; Children under age 88, call  [facility] Pediatric Dent[facility] at [phone]. Children aged 9-14, please call [phone] to request a pediatric application.  Dental services are provided in all areas of dental care including fillings, crowns and bridges, complete and partial dentures, implants, gum treatment, root canals, and extractions. Preventive care is also provided. Treatment is provided to both adults and children. Patients are selected via a lottery and there is often a waiting list.   [facility] [address], [place]  [phone] [email or web address]   [facility] [address] [place], [place] [phone], Ext. [phone] Second and Fourth Thursday of each month, opens at 6:30 AM; Clinic ends at 9 AM.  Patients are seen on a first-come first-served basis, and a limited number are seen during each clinic.   [facility]  [address] Ether Griffins [place], [place] [phone]   Eligibility Requirements You must have lived in [place], [place], or [place] counties for at least the last three months.   You cannot be eligible for state or federal sponsored [place], including [employer], [place], or [employer].   You generally cannot be eligible for healthcare insurance through your employer.    How to apply: Eligibility screenings are held every Tuesday and Wednesday afternoon from 1:00 pm until 4:00 pm. You do not need an appointment for the interview!  [facility] [address], [place], [place] [phone]   [facility] Health Department  [phone]   [facility]  [phone]   [facility]  [phone]    Behavioral Health Resources in the Community: Intensive Outpatient  Programs Organization         Address  Phone  Notes  [facility] Services [address] [place], [place] [phone]   [facility] [address] [place], [place] [phone]   [facility] [address] [place], [place]  [phone]   [facility] [address],  [place], [place] [phone] or [phone]   Substance Abuse Resources Organization         Address  Phone  Notes  [facility]  [phone]   [facility]  [phone]   [facility] [place]  [phone]   Floydene Flock  [phone]   Residential & Outpatient Substance Abuse Program  [phone]   Psychological Services Organization         Address  Phone  Notes  [facility]  [phone]   [facility]  [phone]   [facility] [address], [place] [phone] or [phone]    Mobile Crisis Teams Organization         Address  Phone  Notes  [facility], Mobile Crisis Care Unit  [phone]   [facility]  [address] [place], [place] [phone]   Doristine Locks [address] [place] [phone]    Self-Help/Support Groups Organization         Address  Phone             Notes  [facility] [place] - variety of support groups  336- I7437963 Call for more information  Narcotics Anonymous (NA), [facility] [address], [employer] [place]  2 meetings at this location   Statistician         Address  Phone  Notes  [facility] [address],  [place]  [phone]   [facility]  [address], [place] [phone], [place], [place] [phone]   [facility] [address], [place] [phone] Admissions: 8am-3pm M-F  [facility] [address]    [place], [place]  [phone]   [facility] [address], [place], [place] [phone]   [facility] [address]  [place], [place] [phone]   [facility] - Intensive Outpatient [address], [place], [place] [phone]   [facility] ([facility] [address]  [place], [place] [phone] or [phone]   [facility] ([facility]) [address] [place], [place] [phone] Accepts Medicaid  [facility] [address]  [place] [phone] Substance Abuse/Addiction Treatment   [facility] Behavioral Health Resources Organization         Address  Phone  Notes  [facility]  [phone]) [phone]   Angie FavaJulie Brannon, PhD [address] [place], [place]   [phone] or [phone](336) [phone]   [facility]   [address] [place], [place] [phone]   [facility] [address], [place], [place] [phone]) [phone] Insurance/Medicaid/sponsorship through [facility]  [facility] [address]                                    [place], [place] [phone]) [phone] Therapy/tele-psych/case  [facility] [address]   [place], [place] [phone]    Dr. Lolly MustacheArfeen  [phone]   [facility] [place]  [employer] [facility] 1) [address], [place] 2) [address], [place] 3)  [address], [place] [phone]) [phone]  [phone]   [facility] Child Abuse Hotline [phone]) [phone] or [phone]) [phone] (After Hours)

## 2014-02-06 NOTE — ED Notes (Signed)
Stools x 5, emesis x 9.  She states she continues to feel like she is going to vomit

## 2014-02-06 NOTE — ED Notes (Signed)
Pt reports that she thinks she may have food poisoning. States that she ate a burger last night and has been vomiting since then.

## 2014-02-06 NOTE — ED Provider Notes (Signed)
CSN: [id number]     Arrival date & time 02/06/14  1401 History   First MD Initiated Contact with Patient 02/06/14 1553     Chief Complaint  Patient presents with  . Abdominal Pain  . Emesis   HPI  Patient is a 43 year old female who presents emergency room for evaluation of crampy abdominal pain, nausea and vomiting. Patient states that she ate [employer] last night and had some mild crampy abdominal pain prior to going to bed. She states that she woke up at 1:30 morning and started having severe nausea and vomiting and diarrhea. Patient states that she vomited at least 9 times. She also had 5-6 episodes of watery dark diarrhea. She states that she has not been able to keep anything down since then. She has tried drinking small amount of water. She did not take any medications. She states that she has hypertension which she is supposed to be on lisinopril for but she does not take. She states that her son has similar symptoms at home. She denies any other sick contacts. She denies any recent travel. She denies any suspicious food intake. She denies trauma to the abdomen. She states that her abdominal pain is a central periumbilical crampy pain. She states it feels like she has to have a bowel movement. Patient does report a fever of 101.3.  Past Medical History  Diagnosis Date  . Hypertension   . Migraine headache   . Lung nodule   . Tobacco use   . Anemia    Past Surgical History  Procedure Laterality Date  . Abdominal hysterectomy     Family History  Problem Relation Age of Onset  . Breast cancer Mother    History  Substance Use Topics  . Smoking status: Current Every Day Smoker -- 0.50 packs/day for 19 years    Types: Cigarettes  . Smokeless tobacco: Not on file  . Alcohol Use: No   OB History    No data available     Review of Systems  Constitutional: Positive for fever, chills and fatigue.  Respiratory: Negative for chest tightness and shortness of breath.    Cardiovascular: Negative for chest pain, palpitations and leg swelling.  Gastrointestinal: Positive for nausea, vomiting, abdominal pain and diarrhea. Negative for constipation, blood in stool, anal bleeding and rectal pain.  Genitourinary: Negative for urgency, frequency, hematuria, enuresis, difficulty urinating and dyspareunia.  Musculoskeletal: Negative for back pain.  Skin: Negative for rash.  All other systems reviewed and are negative.     Allergies  Ciprofloxacin; Morphine and related; and Penicillins  Home Medications   Prior to Admission medications   Medication Sig Start Date End Date Taking? Authorizing Provider  albuterol (PROVENTIL HFA;VENTOLIN HFA) 108 (90 BASE) MCG/ACT inhaler Inhale 2 puffs into the lungs every 4 (four) hours as needed for wheezing or shortness of breath. 01/01/14  Yes Harle BattiestElizabeth Tysinger, NP  cyclobenzaprine (FLEXERIL) 10 MG tablet Take 1 tablet (10 mg total) by mouth 2 (two) times daily as needed for muscle spasms. 01/01/14  Yes Harle BattiestElizabeth Tysinger, NP  Ibuprofen-Diphenhydramine HCl (ADVIL PM) 200-25 MG CAPS Take 1 capsule by mouth at bedtime as needed (sleep).   Yes Historical Provider, MD  lisinopril-hydrochlorothiazide (PRINZIDE,ZESTORETIC) 10-12.5 MG per tablet Take 1 tablet by mouth daily.   Yes Historical Provider, MD  naproxen (NAPROSYN) 500 MG tablet Take 1 tablet (500 mg total) by mouth 2 (two) times daily with a meal. 01/01/14  Yes Harle BattiestElizabeth Tysinger, NP  cephALEXin (KEFLEX) 500 MG capsule  Take 1 capsule (500 mg total) by mouth 3 (three) times daily. 02/06/14   Trinka Keshishyan A Forcucci, PA-C  dicyclomine (BENTYL) 20 MG tablet Take 1 tablet (20 mg total) by mouth 2 (two) times daily. 02/06/14   Quanna Wittke A Forcucci, PA-C  ondansetron (ZOFRAN) 8 MG tablet Take 1 tablet (8 mg total) by mouth every 4 (four) hours as needed for nausea. 02/06/14   Nurah Petrides A Forcucci, PA-C   BP 136/81 mmHg  Pulse 92  Temp(Src) 99 F (37.2 C) (Oral)  Resp 20  Ht 5'  (1.524 m)  Wt 127 lb (57.607 kg)  BMI 24.80 kg/m2  SpO2 100% Physical Exam  Constitutional: She is oriented to person, place, and time. She appears well-developed and well-nourished. No distress.  HENT:  Head: Normocephalic and atraumatic.  Mouth/Throat: Oropharynx is clear and moist. No oropharyngeal exudate.  Eyes: Conjunctivae and EOM are normal. Pupils are equal, round, and reactive to light. No scleral icterus.  Neck: Normal range of motion. Neck supple. No JVD present. No thyromegaly present.  Cardiovascular: Normal rate, regular rhythm, normal heart sounds and intact distal pulses.  Exam reveals no gallop and no friction rub.   No murmur heard. Pulmonary/Chest: Effort normal and breath sounds normal. No respiratory distress. She has no wheezes. She has no rales. She exhibits no tenderness.  Abdominal: Soft. Normal appearance and bowel sounds are normal. She exhibits no distension and no mass. There is generalized tenderness. There is no rigidity, no rebound, no guarding, no CVA tenderness, no tenderness at McBurney's point and negative Murphy's sign.  Musculoskeletal: Normal range of motion.  Lymphadenopathy:    She has no cervical adenopathy.  Neurological: She is alert and oriented to person, place, and time.  Skin: Skin is warm and dry. She is not diaphoretic.  Psychiatric: She has a normal mood and affect. Her behavior is normal. Judgment and thought content normal.  Nursing note and vitals reviewed.   ED Course  Procedures (including critical care time) Labs Review Labs Reviewed  CBC WITH DIFFERENTIAL - Abnormal; Notable for the following:    Neutrophils Relative % 83 (*)    Lymphocytes Relative 10 (*)    All other components within normal limits  URINALYSIS, ROUTINE W REFLEX MICROSCOPIC - Abnormal; Notable for the following:    Color, Urine AMBER (*)    APPearance CLOUDY (*)    pH 8.5 (*)    Protein, ur 100 (*)    Nitrite POSITIVE (*)    All other components within  normal limits  URINE MICROSCOPIC-ADD ON - Abnormal; Notable for the following:    Squamous Epithelial / LPF FEW (*)    Bacteria, UA MANY (*)    All other components within normal limits  COMPREHENSIVE METABOLIC PANEL  LIPASE, BLOOD    Imaging Review No results found.   EKG Interpretation None      MDM   Final diagnoses:  UTI (lower urinary tract infection)  Non-intractable vomiting with nausea, vomiting of unspecified type  Diarrhea   Patient is a 43 year old female who presents emergency room for evaluation of abdominal pain physical exam reveals an alert nontoxic-appearing female with normal vital signs. Abdomen is soft and generalized with tender with no guarding, rigidity, or peritoneal signs. This is not a surgical abdomen. CBC and CMP are unremarkable. UA reveals UTI. Urine pregnancy is not performed as patient has had an hysterectomy. Patient given ceftriaxone here. We'll discharge home with Keflex. We'll also treat symptomatically with Zofran and Bentyl for cramping  abdominal pain. Patient to follow-up with her PCP for recheck in a week. Patient to return for intractable nausea and vomiting, abdominal pain, symptoms of dehydration, or any other concerning symptoms. She states understanding and agreement at this time. Patient stable for discharge.    Eben Burowourtney A Forcucci, PA-C 02/06/14 1958  Hilario Quarryanielle S Ray, MD 02/08/14 [phone]

## 2014-02-06 NOTE — ED Notes (Signed)
Patient reports she continues to have cramping pain.  Provider aware,  New orders noted

## 2014-04-29 ENCOUNTER — Ambulatory Visit (HOSPITAL_COMMUNITY): Payer: Medicaid Other

## 2014-04-29 ENCOUNTER — Encounter (HOSPITAL_COMMUNITY): Payer: Self-pay | Admitting: Emergency Medicine

## 2014-04-29 ENCOUNTER — Emergency Department (HOSPITAL_COMMUNITY)
Admission: EM | Admit: 2014-04-29 | Discharge: 2014-04-29 | Disposition: A | Discharge status: Home / Self Care | Payer: Medicaid Other | Attending: Emergency Medicine | Admitting: Emergency Medicine

## 2014-04-29 DIAGNOSIS — Z862 Personal history of diseases of the blood and blood-forming organs and certain disorders involving the immune mechanism: Secondary | ICD-10-CM | POA: Diagnosis not present

## 2014-04-29 DIAGNOSIS — I10 Essential (primary) hypertension: Secondary | ICD-10-CM | POA: Insufficient documentation

## 2014-04-29 DIAGNOSIS — Z79899 Other long term (current) drug therapy: Secondary | ICD-10-CM | POA: Diagnosis not present

## 2014-04-29 DIAGNOSIS — Z720 Tobacco use: Secondary | ICD-10-CM | POA: Diagnosis not present

## 2014-04-29 DIAGNOSIS — G43909 Migraine, unspecified, not intractable, without status migrainosus: Secondary | ICD-10-CM | POA: Diagnosis not present

## 2014-04-29 DIAGNOSIS — Z880 Allergy status to penicillin: Secondary | ICD-10-CM | POA: Diagnosis not present

## 2014-04-29 DIAGNOSIS — K625 Hemorrhage of anus and rectum: Secondary | ICD-10-CM | POA: Insufficient documentation

## 2014-04-29 DIAGNOSIS — R1084 Generalized abdominal pain: Secondary | ICD-10-CM

## 2014-04-29 LAB — COMPREHENSIVE METABOLIC PANEL
ALT: 12 U/L (ref 0–35)
ANION GAP: 2 — AB (ref 5–15)
AST: 19 U/L (ref 0–37)
Albumin: 4.1 g/dL (ref 3.5–5.2)
Alkaline Phosphatase: 48 U/L (ref 39–117)
BUN: 7 mg/dL (ref 6–23)
CALCIUM: 8.6 mg/dL (ref 8.4–10.5)
CO2: 24 mmol/L (ref 19–32)
CREATININE: 0.55 mg/dL (ref 0.50–1.10)
Chloride: 109 mmol/L (ref 96–112)
Glucose, Bld: 102 mg/dL — ABNORMAL HIGH (ref 70–99)
POTASSIUM: 3.6 mmol/L (ref 3.5–5.1)
Sodium: 135 mmol/L (ref 135–145)
TOTAL PROTEIN: 6.7 g/dL (ref 6.0–8.3)
Total Bilirubin: 0.5 mg/dL (ref 0.3–1.2)

## 2014-04-29 LAB — CBC
HEMATOCRIT: 36.2 % (ref 36.0–46.0)
Hemoglobin: 12.1 g/dL (ref 12.0–15.0)
MCH: 28.7 pg (ref 26.0–34.0)
MCHC: 33.4 g/dL (ref 30.0–36.0)
MCV: 85.8 fL (ref 78.0–100.0)
Platelets: 270 10*3/uL (ref 150–400)
RBC: 4.22 MIL/uL (ref 3.87–5.11)
RDW: 13.1 % (ref 11.5–15.5)
WBC: 5.9 10*3/uL (ref 4.0–10.5)

## 2014-04-29 MED ORDER — HYDROCODONE-ACETAMINOPHEN 5-325 MG PO TABS: 1.0000 | ORAL_TABLET | ORAL | Status: DC | PRN

## 2014-04-29 MED ORDER — ONDANSETRON HCL 4 MG/2ML IJ SOLN
4.0000 mg | Freq: Once | INTRAMUSCULAR | Status: AC
  Administered 2014-04-29: 4 mg via INTRAVENOUS
  Filled 2014-04-29: qty 2

## 2014-04-29 MED ORDER — SODIUM CHLORIDE 0.9 % IV BOLUS (SEPSIS)
500.0000 mL | Freq: Once | INTRAVENOUS | Status: AC
  Administered 2014-04-29: 500 mL via INTRAVENOUS

## 2014-04-29 MED ORDER — HYDROCODONE-ACETAMINOPHEN 5-325 MG PO TABS
1.0000 | ORAL_TABLET | Freq: Once | ORAL | Status: AC
  Administered 2014-04-29: 1 via ORAL
  Filled 2014-04-29: qty 1

## 2014-04-29 MED ORDER — IOHEXOL 300 MG/ML  SOLN
50.0000 mL | Freq: Once | INTRAMUSCULAR | Status: AC | PRN
  Administered 2014-04-29: 50 mL via ORAL

## 2014-04-29 MED ORDER — IOHEXOL 300 MG/ML  SOLN
100.0000 mL | Freq: Once | INTRAMUSCULAR | Status: AC | PRN
  Administered 2014-04-29: 100 mL via INTRAVENOUS

## 2014-04-29 MED ORDER — FENTANYL CITRATE 0.05 MG/ML IJ SOLN
100.0000 ug | INTRAMUSCULAR | Status: DC | PRN
  Administered 2014-04-29 (×2): 100 ug via INTRAVENOUS
  Filled 2014-04-29 (×2): qty 2

## 2014-04-29 NOTE — ED Provider Notes (Signed)
CSN: [id number]     Arrival date & time 04/29/14  5366 History   First MD Initiated Contact with Patient 04/29/14 0730     Chief Complaint  Patient presents with  . Rectal Bleeding     (Consider location/radiation/quality/duration/timing/severity/associated sxs/prior Treatment) HPI   Rachel Fischer is a 44 y.o. female who went to work this morning, and developed mid abdominal pain. Subsequent to that, she had a bowel movement and passed a large amount of blood with it. He denies fever, chills, nausea, vomiting, weakness or dizziness. She has never had this happen previously. She is taking her usual medications. She is not on anticoagulants. She has not had diarrhea, or constipation. There are no other known modifying factors.   Past Medical History  Diagnosis Date  . Hypertension   . Migraine headache   . Lung nodule   . Tobacco use   . Anemia    Past Surgical History  Procedure Laterality Date  . Abdominal hysterectomy    . C section x 4    . Foot surgery     Family History  Problem Relation Age of Onset  . Breast cancer Mother   . Hypertension Mother   . Hyperlipidemia Mother   . CAD Mother    History  Substance Use Topics  . Smoking status: Current Every Day Smoker -- 0.50 packs/day for 19 years    Types: Cigarettes  . Smokeless tobacco: Not on file  . Alcohol Use: No   OB History    No data available     Review of Systems  All other systems reviewed and are negative.     Allergies  Ciprofloxacin; Morphine and related; and Penicillins  Home Medications   Prior to Admission medications   Medication Sig Start Date End Date Taking? Authorizing Provider  albuterol (PROVENTIL HFA;VENTOLIN HFA) 108 (90 BASE) MCG/ACT inhaler Inhale 2 puffs into the lungs every 4 (four) hours as needed for wheezing or shortness of breath. 01/01/14  Yes Harle Battiest, NP  HYDROcodone-acetaminophen (NORCO) 5-325 MG per tablet Take 1 tablet by mouth every 4 (four) hours as  needed. 04/29/14   Mancel Bale, MD  lisinopril-hydrochlorothiazide (PRINZIDE,ZESTORETIC) 10-12.5 MG per tablet Take 1 tablet by mouth daily.    Historical Provider, MD   BP 126/79 mmHg  Pulse 72  Temp(Src) 98 F (36.7 C) (Oral)  Resp 20  Ht 5' (1.524 m)  Wt 127 lb (57.607 kg)  BMI 24.80 kg/m2  SpO2 100% Physical Exam  Constitutional: She is oriented to person, place, and time. She appears well-developed and well-nourished.  HENT:  Head: Normocephalic and atraumatic.  Right Ear: External ear normal.  Left Ear: External ear normal.  Eyes: Conjunctivae and EOM are normal. Pupils are equal, round, and reactive to light.  Neck: Normal range of motion and phonation normal. Neck supple.  Cardiovascular: Normal rate, regular rhythm and normal heart sounds.   Pulmonary/Chest: Effort normal and breath sounds normal. She exhibits no bony tenderness.  Abdominal: Soft. She exhibits no distension and no mass. There is tenderness (mild mid abdomen). There is no guarding.  Musculoskeletal: Normal range of motion.  Neurological: She is alert and oriented to person, place, and time. No cranial nerve deficit or sensory deficit. She exhibits normal muscle tone. Coordination normal.  Skin: Skin is warm, dry and intact.  Psychiatric: She has a normal mood and affect. Her behavior is normal. Judgment and thought content normal.  Nursing note and vitals reviewed.   ED Course  Procedures (including critical care time)  Per nursing- on rectal examination, there was a very small amount of yellow stool in the rectum, and no visible blood. Stool guaiac was positive.  Medications  sodium chloride 0.9 % bolus 500 mL (0 mLs Intravenous Stopped 04/29/14 1038)  ondansetron (ZOFRAN) injection 4 mg (4 mg Intravenous Given 04/29/14 0815)  iohexol (OMNIPAQUE) 300 MG/ML solution 50 mL (50 mLs Oral Contrast Given 04/29/14 0817)  iohexol (OMNIPAQUE) 300 MG/ML solution 100 mL (100 mLs Intravenous Contrast Given 04/29/14 0957)   HYDROcodone-acetaminophen (NORCO/VICODIN) 5-325 MG per tablet 1 tablet (1 tablet Oral Given 04/29/14 1349)    No data found.   At D/C Reevaluation with update and discussion. After initial assessment and treatment, an updated evaluation reveals she is toerating food and fluid. She is more comfortable. Findings discussed with patient and all questions answered.Mancel Bale L    Labs Review Labs Reviewed  COMPREHENSIVE METABOLIC PANEL - Abnormal; Notable for the following:    Glucose, Bld 102 (*)    Anion gap 2 (*)    All other components within normal limits  CBC  POC OCCULT BLOOD, ED    Imaging Review Ct Abdomen Pelvis W Contrast  04/29/2014   CLINICAL DATA:  44 year old female with sharp periumbilical pains and hematochezia  EXAM: CT ABDOMEN AND PELVIS WITH CONTRAST  TECHNIQUE: Multidetector CT imaging of the abdomen and pelvis was performed using the standard protocol following bolus administration of intravenous contrast.  CONTRAST:  OMNIPAQUE IOHEXOL 300 MG/ML  SOLN  COMPARISON:  None.  FINDINGS: Lower Chest: The lung bases are clear. Visualized cardiac structures are within normal limits for size. No pericardial effusion. Unremarkable visualized distal thoracic esophagus.  Abdomen: Unremarkable CT appearance of the stomach, duodenum, spleen, adrenal glands and pancreas. Normal hepatic contour and morphology. No discrete hepatic lesion. Gallbladder is unremarkable. No intra or extrahepatic biliary ductal dilatation.  Unremarkable appearance of the bilateral kidneys. No focal solid lesion, hydronephrosis or nephrolithiasis. No evidence of bowel obstruction or focal bowel wall thickening. Normal appendix in the right lower quadrant.  Pelvis: Surgical changes of prior hysterectomy. 2.9 cm low attenuation cyst in the region of the right at adnexa. There is an adjacent 2.5 cm low-attenuation cystic structure. No free fluid or suspicious adenopathy.  Bones/Soft Tissues: No acute fracture  or aggressive appearing lytic or blastic osseous lesion.  Vascular: Trace atherosclerotic vascular calcifications without aneurysmal dilatation or evidence of significant stenosis. Portal, renal and splanchnic veins remain patent.  IMPRESSION: 1. No acute abnormality in the abdomen or pelvis. 2. Two adjacent simple cysts in the right adnexa measuring 2.9 and 2.5 cm respectively likely represent dominant follicular cysts. Findings are almost certainly benign and no specific imaging follow-up is recommended. 3. Trace aortic atherosclerotic vascular calcifications.   Electronically Signed   By: Malachy Moan M.D.   On: 04/29/2014 10:33     EKG Interpretation None      MDM   Final diagnoses:  Generalized abdominal pain  Rectal bleeding    Nonspecific abdominal pain with a single episode of rectal bleeding. Hemodynamically stable. Doubt colitis, ongoing severe bleeding, or impending vascular collapse.  Nursing Notes Reviewed/ Care Coordinated Applicable Imaging Reviewed Interpretation of Laboratory Data incorporated into ED treatment  The patient appears reasonably screened and/or stabilized for discharge and I doubt any other medical condition or other [facility] requiring further screening, evaluation, or treatment in the ED at this time prior to discharge.  Plan: Home Medications- Norco # 10; Home Treatments-  rest gradually advance diet; return here if the recommended treatment, does not improve the symptoms; Recommended follow up- PCP 1 week     Mancel BaleElliott Charlayne Vultaggio, MD 04/30/14 1153

## 2014-04-29 NOTE — ED Notes (Signed)
Pt states she went to work this morning and started having sharp pains that come and go that feel like they go straight through her umbilicus  Pt states she went to use the bathroom to have a bowel movement and along with passing stool she passed blood  Pt states it scared her so she left work and came here  Pt states she did not have to strain to have the bowel movement and it was soft

## 2014-04-29 NOTE — Discharge Instructions (Signed)
Abdominal Pain °Many things can cause abdominal pain. Usually, abdominal pain is not caused by a disease and will improve without treatment. It can often be observed and treated at home. Your health care provider will do a physical exam and possibly order blood tests and X-rays to help determine the seriousness of your pain. However, in many cases, more time must pass before a clear cause of the pain can be found. Before that point, your health care provider may not know if you need more testing or further treatment. °HOME CARE INSTRUCTIONS  °Monitor your abdominal pain for any changes. The following actions may help to alleviate any discomfort you are experiencing: °· Only take over-the-counter or prescription medicines as directed by your health care provider. °· Do not take laxatives unless directed to do so by your health care provider. °· Try a clear liquid diet (broth, tea, or water) as directed by your health care provider. Slowly move to a bland diet as tolerated. °SEEK MEDICAL CARE IF: °· You have unexplained abdominal pain. °· You have abdominal pain associated with nausea or diarrhea. °· You have pain when you urinate or have a bowel movement. °· You experience abdominal pain that wakes you in the night. °· You have abdominal pain that is worsened or improved by eating food. °· You have abdominal pain that is worsened with eating fatty foods. °· You have a fever. °SEEK IMMEDIATE MEDICAL CARE IF:  °· Your pain does not go away within 2 hours. °· You keep throwing up (vomiting). °· Your pain is felt only in portions of the abdomen, such as the right side or the left lower portion of the abdomen. °· You pass bloody or black tarry stools. °MAKE SURE YOU: °· Understand these instructions.   °· Will watch your condition.   °· Will get help right away if you are not doing well or get worse.   °Document Released: 11/17/2004 Document Revised: 02/12/2013 Document Reviewed: 10/17/2012 °[employer]® Patient Information  ©2015 [employer]. This information is not intended to replace advice given to you by your health care provider. Make sure you discuss any questions you have with your health care provider. ° °Bloody Stools °Bloody stools often mean that there is a problem in the digestive tract. Your caregiver may use the term "melena" to describe black, tarry, and bad smelling stools or "hematochezia" to describe red or maroon-colored stools. Blood seen in the stool can be caused by bleeding anywhere along the intestinal tract.  °A black stool usually means that blood is coming from the upper part of the gastrointestinal tract (esophagus, stomach, or small bowel). Passing maroon-colored stools or bright red blood usually means that blood is coming from lower down in the large bowel or the rectum. However, sometimes massive bleeding in the stomach or small intestine can cause bright red bloody stools.  °Consuming black licorice, lead, iron pills, medicines containing bismuth subsalicylate, or blueberries can also cause black stools. Your caregiver can test black stools to see if blood is present. °It is important that the cause of the bleeding be found. Treatment can then be started, and the problem can be corrected. Rectal bleeding may not be serious, but you should not assume everything is okay until you know the cause. It is very important to follow up with your caregiver or a specialist in gastrointestinal problems. °CAUSES  °Blood in the stools can come from various underlying causes. Often, the cause is not found during your first visit. Testing is often needed to discover the   cause of bleeding in the gastrointestinal tract. Causes range from simple to serious or even life-threatening. Possible causes include: °· Hemorrhoids. These are veins that are full of blood (engorged) in the rectum. They cause pain, inflammation, and may bleed. °· Anal fissures. These are areas of painful tearing which may bleed. They are often  caused by passing hard stool. °· Diverticulosis. These are pouches that form on the colon over time, with age, and may bleed significantly. °· Diverticulitis. This is inflammation in areas with diverticulosis. It can cause pain, fever, and bloody stools, although bleeding is rare. °· Proctitis and colitis. These are inflamed areas of the rectum or colon. They may cause pain, fever, and bloody stools. °· Polyps and cancer. Colon cancer is a leading cause of preventable cancer death. It often starts out as precancerous polyps that can be removed during a colonoscopy, preventing progression into cancer. Sometimes, polyps and cancer may cause rectal bleeding. °· Gastritis and ulcers. Bleeding from the upper gastrointestinal tract (near the stomach) may travel through the intestines and produce black, sometimes tarry, often bad smelling stools. In certain cases, if the bleeding is fast enough, the stools may not be black, but red and the condition may be life-threatening. °SYMPTOMS  °You may have stools that are bright red and bloody, that are normal color with blood on them, or that are dark black and tarry. In some cases, you may only have blood in the toilet bowl. Any of these cases need medical care. You may also have: °· Pain at the anus or anywhere in the rectum. °· Lightheadedness or feeling faint. °· Extreme weakness. °· Nausea or vomiting. °· Fever. °DIAGNOSIS °Your caregiver may use the following methods to find the cause of your bleeding: °· Taking a medical history. Age is important. Older people tend to develop polyps and cancer more often. If there is anal pain and a hard, large stool associated with bleeding, a tear of the anus may be the cause. If blood drips into the toilet after a bowel movement, bleeding hemorrhoids may be the problem. The color and frequency of the bleeding are additional considerations. In most cases, the medical history provides clues, but seldom the final answer. °· A visual and  finger (digital) exam. Your caregiver will inspect the anal area, looking for tears and hemorrhoids. A finger exam can provide information when there is tenderness or a growth inside. In men, the prostate is also examined. °· Endoscopy. Several types of small, long scopes (endoscopes) are used to view the colon. °¨ In the office, your caregiver may use a rigid, or more commonly, a flexible viewing sigmoidoscope. This exam is called flexible sigmoidoscopy. It is performed in 5 to 10 minutes. °¨ A more thorough exam is accomplished with a colonoscope. It allows your caregiver to view the entire 5 to 6 foot long colon. Medicine to help you relax (sedative) is usually given for this exam. Frequently, a bleeding lesion may be present beyond the reach of the sigmoidoscope. So, a colonoscopy may be the best exam to start with. Both exams are usually done on an outpatient basis. This means the patient does not stay overnight in the hospital or surgery center. °¨ An upper endoscopy may be needed to examine your stomach. Sedation is used and a flexible endoscope is put in your mouth, down to your stomach. °· A barium enema X-ray. This is an X-ray exam. It uses liquid barium inserted by enema into the rectum. This test alone may not identify an actual bleeding point. X-rays   highlight abnormal shadows, such as those made by lumps (tumors), diverticuli, or colitis. °TREATMENT  °Treatment depends on the cause of your bleeding.  °· For bleeding from the stomach or colon, the caregiver doing your endoscopy or colonoscopy may be able to stop the bleeding as part of the procedure. °· Inflammation or infection of the colon can be treated with medicines. °· Many rectal problems can be treated with creams, suppositories, or warm baths. °· Surgery is sometimes needed. °· Blood transfusions are sometimes needed if you have lost a lot of blood. °· For any bleeding problem, let your caregiver know if you take aspirin or other blood thinners  regularly. °HOME CARE INSTRUCTIONS  °· Take any medicines exactly as prescribed. °· Keep your stools soft by eating a diet high in fiber. Prunes (1 to 3 a day) work well for many people. °· Drink enough water and fluids to keep your urine clear or pale yellow. °· Take sitz baths if advised. A sitz bath is when you sit in a bathtub with warm water for 10 to 15 minutes to soak, soothe, and cleanse the rectal area. °· If enemas or suppositories are advised, be sure you know how to use them. Tell your caregiver if you have problems with this. °· Monitor your bowel movements to look for signs of improvement or worsening. °SEEK MEDICAL CARE IF:  °· You do not improve in the time expected. °· Your condition worsens after initial improvement. °· You develop any new symptoms. °SEEK IMMEDIATE MEDICAL CARE IF:  °· You develop severe or prolonged rectal bleeding. °· You vomit blood. °· You feel weak or faint. °· You have a fever. °MAKE SURE YOU: °· Understand these instructions. °· Will watch your condition. °· Will get help right away if you are not doing well or get worse. °Document Released: 01/28/2002 Document Revised: 05/02/2011 Document Reviewed: 06/25/2010 °[employer]® Patient Information ©2015 [employer]. This information is not intended to replace advice given to you by your health care provider. Make sure you discuss any questions you have with your health care provider. ° °

## 2014-05-13 ENCOUNTER — Emergency Department (HOSPITAL_COMMUNITY)
Admission: EM | Admit: 2014-05-13 | Discharge: 2014-05-13 | Discharge status: Against Medical Advice | Payer: Medicaid Other | Attending: Emergency Medicine | Admitting: Emergency Medicine

## 2014-05-13 ENCOUNTER — Emergency Department (HOSPITAL_COMMUNITY): Payer: Medicaid Other

## 2014-05-13 ENCOUNTER — Encounter (HOSPITAL_COMMUNITY): Payer: Self-pay

## 2014-05-13 DIAGNOSIS — R110 Nausea: Secondary | ICD-10-CM | POA: Insufficient documentation

## 2014-05-13 DIAGNOSIS — R42 Dizziness and giddiness: Secondary | ICD-10-CM | POA: Diagnosis not present

## 2014-05-13 DIAGNOSIS — Z720 Tobacco use: Secondary | ICD-10-CM | POA: Diagnosis not present

## 2014-05-13 DIAGNOSIS — I10 Essential (primary) hypertension: Secondary | ICD-10-CM | POA: Diagnosis not present

## 2014-05-13 DIAGNOSIS — R079 Chest pain, unspecified: Secondary | ICD-10-CM | POA: Insufficient documentation

## 2014-05-13 LAB — CBC
HEMATOCRIT: 37.2 % (ref 36.0–46.0)
Hemoglobin: 12.5 g/dL (ref 12.0–15.0)
MCH: 28.5 pg (ref 26.0–34.0)
MCHC: 33.6 g/dL (ref 30.0–36.0)
MCV: 84.7 fL (ref 78.0–100.0)
PLATELETS: 323 10*3/uL (ref 150–400)
RBC: 4.39 MIL/uL (ref 3.87–5.11)
RDW: 12.7 % (ref 11.5–15.5)
WBC: 4.5 10*3/uL (ref 4.0–10.5)

## 2014-05-13 LAB — BASIC METABOLIC PANEL
Anion gap: 9 (ref 5–15)
BUN: 6 mg/dL (ref 6–23)
CO2: 25 mmol/L (ref 19–32)
CREATININE: 0.72 mg/dL (ref 0.50–1.10)
Calcium: 9.1 mg/dL (ref 8.4–10.5)
Chloride: 103 mmol/L (ref 96–112)
GFR calc Af Amer: >90 mL/min (ref >90)
GFR calc non Af Amer: >90 mL/min (ref >90)
GLUCOSE: 104 mg/dL — AB (ref 70–99)
Potassium: 3.4 mmol/L — ABNORMAL LOW (ref 3.5–5.1)
Sodium: 137 mmol/L (ref 135–145)

## 2014-05-13 LAB — I-STAT TROPONIN, ED: TROPONIN I, POC: 0 ng/mL (ref 0.00–0.08)

## 2014-05-13 NOTE — ED Notes (Signed)
Pt states she was at work and started having a pulling sensation in her chest. Felt dizzy when it started and felt nauseated.

## 2014-05-15 ENCOUNTER — Emergency Department (HOSPITAL_COMMUNITY)
Admission: EM | Admit: 2014-05-15 | Discharge: 2014-05-15 | Disposition: A | Discharge status: Home / Self Care | Payer: Medicaid Other | Attending: Emergency Medicine | Admitting: Emergency Medicine

## 2014-05-15 ENCOUNTER — Emergency Department (HOSPITAL_COMMUNITY): Payer: Medicaid Other

## 2014-05-15 ENCOUNTER — Emergency Department (INDEPENDENT_AMBULATORY_CARE_PROVIDER_SITE_OTHER)
Admission: EM | Admit: 2014-05-15 | Discharge: 2014-05-15 | Disposition: A | Discharge status: Nursing Home (Includes ICF) | Payer: Medicaid Other | Source: Home / Self Care | Attending: Family Medicine | Admitting: Family Medicine

## 2014-05-15 ENCOUNTER — Encounter (HOSPITAL_COMMUNITY): Payer: Self-pay | Admitting: Emergency Medicine

## 2014-05-15 DIAGNOSIS — Z79899 Other long term (current) drug therapy: Secondary | ICD-10-CM | POA: Insufficient documentation

## 2014-05-15 DIAGNOSIS — R0600 Dyspnea, unspecified: Secondary | ICD-10-CM | POA: Diagnosis not present

## 2014-05-15 DIAGNOSIS — Z8679 Personal history of other diseases of the circulatory system: Secondary | ICD-10-CM | POA: Diagnosis not present

## 2014-05-15 DIAGNOSIS — R079 Chest pain, unspecified: Secondary | ICD-10-CM

## 2014-05-15 DIAGNOSIS — I10 Essential (primary) hypertension: Secondary | ICD-10-CM | POA: Diagnosis not present

## 2014-05-15 DIAGNOSIS — Z720 Tobacco use: Secondary | ICD-10-CM | POA: Diagnosis not present

## 2014-05-15 DIAGNOSIS — Z880 Allergy status to penicillin: Secondary | ICD-10-CM | POA: Insufficient documentation

## 2014-05-15 DIAGNOSIS — E876 Hypokalemia: Secondary | ICD-10-CM | POA: Diagnosis not present

## 2014-05-15 DIAGNOSIS — R42 Dizziness and giddiness: Secondary | ICD-10-CM | POA: Insufficient documentation

## 2014-05-15 DIAGNOSIS — R110 Nausea: Secondary | ICD-10-CM | POA: Insufficient documentation

## 2014-05-15 DIAGNOSIS — R0602 Shortness of breath: Secondary | ICD-10-CM | POA: Diagnosis not present

## 2014-05-15 DIAGNOSIS — R0789 Other chest pain: Secondary | ICD-10-CM | POA: Insufficient documentation

## 2014-05-15 DIAGNOSIS — Z862 Personal history of diseases of the blood and blood-forming organs and certain disorders involving the immune mechanism: Secondary | ICD-10-CM | POA: Insufficient documentation

## 2014-05-15 DIAGNOSIS — R0682 Tachypnea, not elsewhere classified: Secondary | ICD-10-CM

## 2014-05-15 DIAGNOSIS — R61 Generalized hyperhidrosis: Secondary | ICD-10-CM | POA: Insufficient documentation

## 2014-05-15 LAB — BASIC METABOLIC PANEL
ANION GAP: 10 (ref 5–15)
BUN: 6 mg/dL (ref 6–23)
CALCIUM: 9.6 mg/dL (ref 8.4–10.5)
CO2: 27 mmol/L (ref 19–32)
CREATININE: 0.67 mg/dL (ref 0.50–1.10)
Chloride: 100 mmol/L (ref 96–112)
GFR calc Af Amer: >90 mL/min (ref >90)
GLUCOSE: 111 mg/dL — AB (ref 70–99)
Potassium: 3.3 mmol/L — ABNORMAL LOW (ref 3.5–5.1)
Sodium: 137 mmol/L (ref 135–145)

## 2014-05-15 LAB — CBC
HEMATOCRIT: 42.8 % (ref 36.0–46.0)
HEMOGLOBIN: 14.3 g/dL (ref 12.0–15.0)
MCH: 28.8 pg (ref 26.0–34.0)
MCHC: 33.4 g/dL (ref 30.0–36.0)
MCV: 86.1 fL (ref 78.0–100.0)
Platelets: 336 10*3/uL (ref 150–400)
RBC: 4.97 MIL/uL (ref 3.87–5.11)
RDW: 12.8 % (ref 11.5–15.5)
WBC: 5.7 10*3/uL (ref 4.0–10.5)

## 2014-05-15 LAB — I-STAT TROPONIN, ED
Troponin i, poc: 0.01 ng/mL (ref 0.00–0.08)
Troponin i, poc: 0 ng/mL (ref 0.00–0.08)

## 2014-05-15 LAB — BRAIN NATRIURETIC PEPTIDE: B Natriuretic Peptide: 6.1 pg/mL (ref 0.0–100.0)

## 2014-05-15 LAB — D-DIMER, QUANTITATIVE (NOT AT ARMC): D DIMER QUANT: 0.28 ug{FEU}/mL (ref 0.00–0.48)

## 2014-05-15 MED ORDER — SODIUM CHLORIDE 0.9 % IV BOLUS (SEPSIS)
1000.0000 mL | Freq: Once | INTRAVENOUS | Status: AC
  Administered 2014-05-15: 1000 mL via INTRAVENOUS

## 2014-05-15 MED ORDER — ACETAMINOPHEN 325 MG PO TABS
325.0000 mg | ORAL_TABLET | Freq: Once | ORAL | Status: DC
  Filled 2014-05-15: qty 1

## 2014-05-15 NOTE — ED Notes (Signed)
Transfer specifics incomplete secondary to review of ekg

## 2014-05-15 NOTE — ED Provider Notes (Signed)
CSN: [id number]     Arrival date & time 05/15/14  1011 History   First MD Initiated Contact with Patient 05/15/14 1029     Chief Complaint  Patient presents with  . Headache  . Hypertension   (Consider location/radiation/quality/duration/timing/severity/associated sxs/prior Treatment) HPI Comments: 44 year old female with a history of hypertension and smoking was seen in the emergency department 2 days ago and had the initial EKG, chest x-ray and blood work while waiting for a bed. She stated that she could not wait any longer and she left AMA. [facility] one week before that for evaluation of chest pain. She states they did not know exactly what she had either that it might be a pulled muscle in her chest but was uncertain as to why she is breathing so fast. She is complaining of shortness of breath and intermittent chest pain that worsened this morning.. She states the current pain is located in the left anterior chest describing it as a pulling and pressure discomfort. She states it feels like "someone is sitting on my chest". She states is sometimes hard to breathe and that she seems to be breathing too fast at times. It is noted that the EKG and labwork documented from the triage initiated workup was unremarkable for cardiac event. History of hypertension but her last several blood pressure values are within normal limits. She does not appear to be in any distress. She is breathing at 44 times per minute. Speaking in complete sentences and rapidly.    Past Medical History  Diagnosis Date  . Hypertension   . Migraine headache   . Lung nodule   . Tobacco use   . Anemia    Past Surgical History  Procedure Laterality Date  . Abdominal hysterectomy    . C section x 4    . Foot surgery     Family History  Problem Relation Age of Onset  . Breast cancer Mother   . Hypertension Mother   . Hyperlipidemia Mother   . CAD Mother    History  Substance Use  Topics  . Smoking status: Current Every Day Smoker -- 0.50 packs/day for 19 years    Types: Cigarettes  . Smokeless tobacco: Not on file  . Alcohol Use: No   OB History    No data available     Review of Systems  Constitutional: Positive for activity change and fatigue. Negative for fever.  HENT: Negative.   Respiratory: Positive for chest tightness and shortness of breath.   Cardiovascular: Positive for chest pain. Negative for leg swelling.  Gastrointestinal: Negative.   Musculoskeletal: Negative.   Skin: Negative.   Psychiatric/Behavioral: The patient is nervous/anxious.     Allergies  Ciprofloxacin; Morphine and related; and Penicillins  Home Medications   Prior to Admission medications   Medication Sig Start Date End Date Taking? Authorizing Provider  albuterol (PROVENTIL HFA;VENTOLIN HFA) 108 (90 BASE) MCG/ACT inhaler Inhale 2 puffs into the lungs every 4 (four) hours as needed for wheezing or shortness of breath. 01/01/14   Harle BattiestElizabeth Tysinger, NP  HYDROcodone-acetaminophen (NORCO) 5-325 MG per tablet Take 1 tablet by mouth every 4 (four) hours as needed. 04/29/14   Mancel BaleElliott Wentz, MD  lisinopril-hydrochlorothiazide (PRINZIDE,ZESTORETIC) 10-12.5 MG per tablet Take 1 tablet by mouth daily.    Historical Provider, MD   BP 122/86 mmHg  Pulse 100  Temp(Src) 98.6 F (37 C) (Oral)  Resp 14  SpO2 100% Physical Exam  Constitutional: She is  oriented to person, place, and time. She appears well-developed and well-nourished. No distress.  Eyes: EOM are normal.  Neck: Normal range of motion. Neck supple.  Cardiovascular: Normal rate, regular rhythm, normal heart sounds and intact distal pulses.   Pulmonary/Chest: Effort normal. She has no rales.  Tachypnea. Mildly decreased breath sounds bilaterally.  Abdominal: Soft. There is no tenderness.  Musculoskeletal: She exhibits no edema.  Lymphadenopathy:    She has no cervical adenopathy.  Neurological: She is alert and oriented to  person, place, and time. She exhibits normal muscle tone.  Skin: Skin is warm and dry.  Psychiatric: She has a normal mood and affect.  Nursing note and vitals reviewed.   ED Course  ED EKG (<2mins upon arrival to the ED)  Date/Time: 05/15/2014 10:48 AM Performed by: Phineas Real, Stephanie Mcglone Authorized by: Cathren Laine Rhythm: sinus rhythm QRS axis: normal Q waves: II Clinical impression: non-specific ECG   (including critical care time) Labs Review Labs Reviewed - No data to display  Imaging Review Dg Chest 2 View  05/13/2014   CLINICAL DATA:  44 year old female with nausea chest pain and headache. Initial encounter.  EXAM: CHEST  2 VIEW  COMPARISON:  01/01/2014 and earlier.  FINDINGS: Stable and normal lung volumes. Normal cardiac size and mediastinal contours. Visualized tracheal air column is within normal limits. Lung parenchyma stable and clear. No pneumothorax or pleural effusion. Stable and negative visualized osseous structures.  IMPRESSION: Negative, no acute cardiopulmonary abnormality.   Electronically Signed   By: Odessa Fleming M.D.   On: 05/13/2014 12:59   ED ECG REPORT   Date: 05/15/2014  Rate: 86  Rhythm: NSR  QRS Axis:   Intervals: PR 158 ms  ST/T Wave abnormalities: Biphasic T waves in III, V2  Conduction Disutrbances:none   Narrative Interpretation: NSR, no ectopy, doubt criteria to meet septal infarct.  Old EKG Reviewed: yes . 05/13/14  I have personally reviewed the EKG tracing and disagree with the computerized printout as noted regarding septal infarct.  MDM   1. Chest pain, unspecified chest pain type   2. Dyspnea   3. Tachypnea      Patient to be discharged to the emergency department via shuttle for evaluation of chest pain and shortness of breath and headache.. Although she is anxious and tachypnea she is in no distress. My initial impression is that she is having anterior chest wall pain and anxiety with hyperventilation. She does have risk factors for  cardiopulmonary disease and emboli due to history of hypertension, smoking and anemia.     Hayden Rasmussen, NP 05/15/14 1155

## 2014-05-15 NOTE — ED Notes (Signed)
sts left AMA Tuesday after coming in for CP, [facility], also c/o HA, SOB, feeling jittery, A/O X4, NAD

## 2014-05-15 NOTE — ED Notes (Signed)
Patient reports headache, hypertension, and chest pain.  Headache for 3 days, intermittent chest pain.  Reports going to ed 05/13/14.  Reports labs, ekg, cxr.  Did not stay for physician evaluation.  Patient reports having 3 episodes of chest pain this morning.  Reports pulling episode, then breathing gets fast and followed with jittery feeling.  Episodes lasted 1 - 1.5 minutes when they occurred.  Overall feeling bad

## 2014-05-15 NOTE — Discharge Instructions (Signed)
Please call your doctor for a followup appointment within 24-48 hours. When you talk to your doctor please let them know that you were seen in the emergency department and have them acquire all of your records so that they can discuss the findings with you and formulate a treatment plan to fully care for your new and ongoing problems. Please call and set-up an appointment with your primary care provider Please rest and stay hydrated Please apply warm compressions and massage  Please continue to monitor symptoms closely and if symptoms are to worsen or change (fever greater than 101, chills, sweating, nausea, vomiting, chest pain, shortness of breathe, difficulty breathing, weakness, numbness, tingling, worsening or changes to pain pattern, coughing up blood, fainting, neck pain, neck stiffness, weakness, loss of sensation to the left arm, fall, injury) please report back to the Emergency Department immediately.    Chest Pain (Nonspecific) It is often hard to give a specific diagnosis for the cause of chest pain. There is always a chance that your pain could be related to something serious, such as a heart attack or a blood clot in the lungs. You need to follow up with your health care provider for further evaluation. CAUSES   Heartburn.  Pneumonia or bronchitis.  Anxiety or stress.  Inflammation around your heart (pericarditis) or lung (pleuritis or pleurisy).  A blood clot in the lung.  A collapsed lung (pneumothorax). It can develop suddenly on its own (spontaneous pneumothorax) or from trauma to the chest.  Shingles infection (herpes zoster virus). The chest wall is composed of bones, muscles, and cartilage. Any of these can be the source of the pain.  The bones can be bruised by injury.  The muscles or cartilage can be strained by coughing or overwork.  The cartilage can be affected by inflammation and become sore (costochondritis). DIAGNOSIS  Lab tests or other studies may be  needed to find the cause of your pain. Your health care provider may have you take a test called an ambulatory electrocardiogram (ECG). An ECG records your heartbeat patterns over a 24-hour period. You may also have other tests, such as:  Transthoracic echocardiogram (TTE). During echocardiography, sound waves are used to evaluate how blood flows through your heart.  Transesophageal echocardiogram (TEE).  Cardiac monitoring. This allows your health care provider to monitor your heart rate and rhythm in real time.  Holter monitor. This is a portable device that records your heartbeat and can help diagnose heart arrhythmias. It allows your health care provider to track your heart activity for several days, if needed.  Stress tests by exercise or by giving medicine that makes the heart beat faster. TREATMENT   Treatment depends on what may be causing your chest pain. Treatment may include:  Acid blockers for heartburn.  Anti-inflammatory medicine.  Pain medicine for inflammatory conditions.  Antibiotics if an infection is present.  You may be advised to change lifestyle habits. This includes stopping smoking and avoiding alcohol, caffeine, and chocolate.  You may be advised to keep your head raised (elevated) when sleeping. This reduces the chance of acid going backward from your stomach into your esophagus. Most of the time, nonspecific chest pain will improve within 2-3 days with rest and mild pain medicine.  HOME CARE INSTRUCTIONS   If antibiotics were prescribed, take them as directed. Finish them even if you start to feel better.  For the next few days, avoid physical activities that bring on chest pain. Continue physical activities as directed.  Do not use any tobacco products, including cigarettes, chewing tobacco, or electronic cigarettes.  Avoid drinking alcohol.  Only take medicine as directed by your health care provider.  Follow your health care provider's suggestions  for further testing if your chest pain does not go away.  Keep any follow-up appointments you made. If you do not go to an appointment, you could develop lasting (chronic) problems with pain. If there is any problem keeping an appointment, call to reschedule. SEEK MEDICAL CARE IF:   Your chest pain does not go away, even after treatment.  You have a rash with blisters on your chest.  You have a fever. SEEK IMMEDIATE MEDICAL CARE IF:   You have increased chest pain or pain that spreads to your arm, neck, jaw, back, or abdomen.  You have shortness of breath.  You have an increasing cough, or you cough up blood.  You have severe back or abdominal pain.  You feel nauseous or vomit.  You have severe weakness.  You faint.  You have chills. This is an emergency. Do not wait to see if the pain will go away. Get medical help at once. Call your local emergency services (911 in [place] Do not drive yourself to the hospital. MAKE SURE YOU:   Understand these instructions.  Will watch your condition.  Will get help right away if you are not doing well or get worse. Document Released: 11/17/2004 Document Revised: 02/12/2013 Document Reviewed: 09/13/2007 [facility] Patient Information 2015 [place], [place]. This information is not intended to replace advice given to you by your health care provider. Make sure you discuss any questions you have with your health care provider.   Emergency Department Resource Guide 1) Find a Doctor and Pay Out of Pocket Although you won't have to find out who is covered by your insurance plan, it is a good idea to ask around and get recommendations. You will then need to call the office and see if the doctor you have chosen will accept you as a new patient and what types of options they offer for patients who are self-pay. Some doctors offer discounts or will set up payment plans for their patients who do not have insurance, but you will need to ask so you aren't  surprised when you get to your appointment.  2) Contact Your Local Health Department Not all health departments have doctors that can see patients for sick visits, but many do, so it is worth a call to see if yours does. If you don't know where your local health department is, you can check in your phone book. The CDC also has a tool to help you locate your state's health department, and many state websites also have listings of all of their local health departments.  3) Find a [facility] If your illness is not likely to be very severe or complicated, you may want to try a walk in clinic. These are popping up all over the country in pharmacies, drugstores, and shopping centers. They're usually staffed by nurse practitioners or physician assistants that have been trained to treat common illnesses and complaints. They're usually fairly quick and inexpensive. However, if you have serious medical issues or chronic medical problems, these are probably not your best option.  No Primary Care Doctor: - Call Health Connect at  [phone] - they can help you locate a primary care doctor that  accepts your insurance, provides certain services, etc. - Physician Referral Service- [phone]  Chronic Pain Problems: Organization  Address  Phone   Notes  [facility]  [phone] Patients need to be referred by their primary care doctor.   Medication Assistance: Organization         Address  Phone   Notes  [facility] Medication [facility] [address] [place], [place] [address] [phone] --Must be a resident of [facility] -- Must have NO insurance coverage whatsoever (no Medicaid/ Medicare, etc.) -- The pt. MUST have a primary care doctor that directs their care regularly and follows them in the community   MedAssist  [phone]   [employer]  [phone]    Agencies that provide inexpensive medical care: Organization          Address  Phone   Notes  [place]  [phone]   [facility]    [phone]   [facility] [place], [place] [address] [phone]   [place] [address] [place] [phone]   Planned Parenthood    [phone]   [facility]    [phone]   [place] and [place] [address], [place] Phone:  [phone], Fax:  [phone] Hours of Operation:  9 am - 6 pm, M-F.  Also accepts Medicaid/Medicare and self-pay.  [facility] for [place], [address], [place] Phone: [phone], Fax: [phone]. Hours of Operation:  8:30 am - 5:30 pm, M-F.  Also accepts Medicaid and self-pay.  [facility] [place] [address] [place] Phone: [phone]   [place], [place], [place] [phone], Ext. [phone] Mondays & Thursdays: 7-9 AM.  First 15 patients are seen on a first come, first serve basis.    [place] Providers:  Organization         Address  Phone   Notes  [facility] [address], [place] [phone] Also accepts self-pay patients.  [facility] [address], [place]  [phone]   [place], [address], [place] [phone]   [facility] [address] [place] [phone]   Lucianne Lei [address], [place]   [phone] Only accepts [place] Access [place] patients after they have their name applied to their card.   Self-Pay (no insurance) in [facility]:  Organization         Address  Phone   Notes  Sickle Cell Patients, [facility] Internal [facility] [place] [phone]   [facility] [place] [phone]     [facility] [place]  [place], [place], [place] [phone]   [facility]/Dr. Osei-Bonsu  [address] [place] or [address], [place] [phone] Phone number for both [place] and [place] locations is the same.  Urgent Medical and [facility] [address], [place] [phone]   [facility] [address], [place] or [address] [phone]   [facility] [address] [place] [phone], phone; [phone], fax Sees patients 1st and 3rd Saturday of every month.  Must not  qualify for public or private insurance (i.e. Medicaid, Medicare, [place] Health Choice, Veterans' Benefits)  Household income should be no more than 200% of the poverty level The clinic cannot treat you if you are pregnant or think you are pregnant  Sexually transmitted diseases are not treated at the clinic.    Dental Care: Organization         Address  Phone  Notes  [facility] Department of [facility] [place] [phone] Accepts children up to age 61 who are enrolled in [place] or [place]; pregnant women with a Medicaid card; and children who have applied for Medicaid or [place] Health Choice, but were declined, whose parents can pay a reduced fee at time of service.  [facility] Department of [facility]  [address], [place] [phone] Accepts children up to age 55 who are enrolled in [place] or [place]; pregnant women with a Medicaid card; and children who have applied for Medicaid or [place] Health Choice, but were declined, whose parents can pay a reduced fee at time of service.  [place] Adult [facility] Access PROGRAM  [place] [phone] Patients are seen by appointment only. Walk-ins are not accepted. [place] will see patients 73  years of age and older. Monday - Tuesday (8am-5pm) Most Wednesdays (8:30-5pm) $30 per visit, cash only  [facility] Adult Dental Access PROGRAM  [address], [facility] [phone] Patients are seen by appointment only. Walk-ins are not accepted. [place] will see patients 36 years of age and older. One Wednesday Evening (Monthly: Volunteer Based).  $30 per visit, cash only  [place]  [phone] for adults; Children under age 53, call Graduate Pediatric Dentistry at [phone]. Children aged 46-14, please call [phone] to request a pediatric application.  Dental services are provided in all areas of dental care including fillings, crowns and bridges, complete and partial dentures, implants, gum treatment, root canals, and extractions. Preventive care is also provided. Treatment is provided to both adults and children. Patients are selected via a lottery and there is often a waiting list.   [facility] [address] [place]  [phone] [email or web address]   [facility] [address] [place], [place] [phone], Ext. [phone] Second and Fourth Thursday of each month, opens at 6:30 AM; Clinic ends at 9 AM.  Patients are seen on a first-come first-served basis, and a limited number are seen during each clinic.   [facility]  [address] Hillard Danker [place], [place] [phone]   Eligibility Requirements You must have lived in [place], [place], or [place] counties for at least the last three months.   You cannot be eligible for state or federal sponsored [employer], including [employer], [place], or [employer].   You generally cannot be eligible for healthcare insurance through your employer.    How to apply: Eligibility screenings are held every Tuesday and Wednesday afternoon from 1:00 pm until 4:00 pm. You do not need an appointment for the interview!  [facility]  [address] [place], [place]   [place]  [place] Department  [place]  [phone]    Behavioral Health Resources in the Community: Intensive Outpatient Programs Organization         Address  Phone  Notes  [place] [address] [place], [place] [phone]   [facility] Outpatient [address], [place], [place]   ADS: Alcohol & Drug Svcs [address], [place], [place]   [place] [address]  [place], [place] or [phone]   Substance Abuse Resources Organization         Address  Phone  Notes  Alcohol and Drug Services  [phone]   [place]  [phone]   [facility] [place]   Chinita Pester  [phone]   Residential & Outpatient Substance Abuse Program  [phone]   Psychological Services Organization         Address  Phone  Notes  [facility] [place]  [place]  [phone]   [place] [address], [place] or [phone]    Mobile Crisis Teams Organization         Address  Phone  Notes  [facility], Mobile Crisis Care Unit  [phone]   [facility]  [address] [place], [place]   [place] Levels [address] [place] [phone]    Self-Help/Support Groups Organization         Address  Phone             Notes  [place]. of [place] - variety of support groups  [place] Call for more information  Narcotics Anonymous (NA), [facility] [address], [employer] [place]  2 meetings at this location   Special educational needs teacher         Address  Phone  Notes  [facility] Residential Treatment [place],    [place]   [phone]   [facility]  [address] [place] [address]390, [place], [place]   [place], [place] [phone] Admissions: 8am-3pm M-F  Incentives Substance [place] [address],    [place], [place] [phone]   [facility] [address] [place], [place], [place]   [facility] [address]  [place], [place]   [facility] - Intensive Outpatient [address], [place], [place]   [facility] ([place] [address]  [place], [place] [phone] or [phone]   [facility] Services (RTS) [address] [place], [place] Accepts Medicaid  [facility] [place] [address],  [place] [phone] Substance Abuse/Addiction Treatment   [facility] Organization         Address  Phone  Notes  [facility]  [phone]   Domenic Schwab, PhD [address] Arlis [place], [place]   [phone] or [phone]   [place], [place] [phone]   [place] [address] [place], [place] [phone] Insurance/Medicaid/sponsorship through [facility] and Families [address], [address] [place]                                    [place], [place] [phone] [place] [address] [place] [phone]    Dr. Adele Schilder  [phone]   [facility]  [place] Dept. 1) [address] [place] [address] [place] 2) [place] 3)  [place] [address], [place] [phone]  [phone]   [place] [phone] or [phone] (After Hours)

## 2014-05-15 NOTE — ED Provider Notes (Signed)
CSN: [id number]     Arrival date & time 05/15/14  1224 History   First MD Initiated Contact with Patient 05/15/14 1624     Chief Complaint  Patient presents with  . Chest Pain     (Consider location/radiation/quality/duration/timing/severity/associated sxs/prior Treatment) The history is provided by the patient. No language interpreter was used.  Rachel Fischer is a 44 year old female with past medical history of hypertension, anemia, tobacco abuse smoking approximately 5 cigarettes per day presenting to the ED with chest pain that has been ongoing for approximately 3 days. Patient reports that the chest pain is localized left-sided the chest described as a pressure sensation, as if someone is sitting on her chest. Reported that the pain last approximately an minute and half but occurs every 30 minutes. Patient reported associated dizziness, diaphoresis, shortness of breath and nausea associated with the episodes of chest pain. Reported that the pain radiates to her left arm and described as a weakness when this occurs. Reported that the chest is tender to palpation. Reported that she's been using Vicodin for pain relief. Stated that she was seen and assessed in urgent care Center earlier today who recommended patient to come to the ED to be assessed. This provider reviewed urgent care Center's chart, patient was found to be tachypnea with a respiratory rate of 44/m-appears to be hyperventilating and anxious. Patient is now, respiratory rate of 13/m. Patient reported that back in 2013 she was diagnosed with a lung nodule, reported that she has yet to follow-up with her primary care provider regarding this and is concerned. Denied fever, chills, fainting, blurred vision, sudden loss of vision, neck pain, neck stiffness, vomiting, diarrhea, fall, cough, metastasis, nasal congestion, travels, leg swelling, unexplained weight loss. PCP Dr. Claudie Revering  Past Medical History  Diagnosis Date  . Hypertension    . Migraine headache   . Lung nodule   . Tobacco use   . Anemia    Past Surgical History  Procedure Laterality Date  . Abdominal hysterectomy    . C section x 4    . Foot surgery     Family History  Problem Relation Age of Onset  . Breast cancer Mother   . Hypertension Mother   . Hyperlipidemia Mother   . CAD Mother    History  Substance Use Topics  . Smoking status: Current Every Day Smoker -- 0.50 packs/day for 19 years    Types: Cigarettes  . Smokeless tobacco: Not on file  . Alcohol Use: No   OB History    No data available     Review of Systems  Constitutional: Negative for fever and chills.  HENT: Negative for congestion.   Respiratory: Positive for shortness of breath. Negative for chest tightness.   Cardiovascular: Positive for chest pain. Negative for leg swelling.  Gastrointestinal: Positive for nausea. Negative for vomiting, abdominal pain and diarrhea.  Neurological: Positive for light-headedness. Negative for dizziness, weakness, numbness and headaches.      Allergies  Ciprofloxacin; Morphine and related; and Penicillins  Home Medications   Prior to Admission medications   Medication Sig Start Date End Date Taking? Authorizing Provider  albuterol (PROVENTIL HFA;VENTOLIN HFA) 108 (90 BASE) MCG/ACT inhaler Inhale 2 puffs into the lungs every 4 (four) hours as needed for wheezing or shortness of breath. 01/01/14  Yes Harle Battiest, NP  HYDROcodone-acetaminophen (NORCO) 5-325 MG per tablet Take 1 tablet by mouth every 4 (four) hours as needed. 04/29/14  Yes Mancel Bale, MD  lisinopril-hydrochlorothiazide [facility])  10-12.5 MG per tablet Take 1 tablet by mouth daily.   Yes Historical Provider, MD   BP 114/72 mmHg  Pulse 84  Temp(Src) 98.2 F (36.8 C) (Oral)  Resp 20  Ht 5' (1.524 m)  Wt 130 lb (58.968 kg)  BMI 25.39 kg/m2  SpO2 100% Physical Exam  Constitutional: She is oriented to person, place, and time. She appears  well-developed and well-nourished. No distress.  HENT:  Head: Normocephalic and atraumatic.  Mouth/Throat: Oropharynx is clear and moist. No oropharyngeal exudate.  Eyes: Conjunctivae and EOM are normal. Pupils are equal, round, and reactive to light. Right eye exhibits no discharge. Left eye exhibits no discharge.  Neck: Normal range of motion. Neck supple. No tracheal deviation present.  Cardiovascular: Normal rate, regular rhythm and normal heart sounds.  Exam reveals no friction rub.   No murmur heard. Pulses:      Radial pulses are 2+ on the right side, and 2+ on the left side.       Dorsalis pedis pulses are 2+ on the right side, and 2+ on the left side.  Cap refill less than 3 seconds Negative swelling or pitting edema identified to lower tremors bilaterally  Pulmonary/Chest: Effort normal and breath sounds normal. No respiratory distress. She has no wheezes. She has no rales. She exhibits tenderness (tenderness upon palpation to the chest wall, left aspect - appears to be muscular nature secondary to pain reproducible upon palpation).  Patient is able to speak in full sentences without difficulty Negative use of accessory muscles Negative stridor  Musculoskeletal: Normal range of motion.  Full ROM to upper and lower extremities without difficulty noted, negative ataxia noted.  Lymphadenopathy:    She has no cervical adenopathy.  Neurological: She is alert and oriented to person, place, and time. No cranial nerve deficit. She exhibits normal muscle tone. Coordination normal.  Cranial nerves III-XII grossly intact Strength 5+/5+ to upper and lower extremities bilaterally with resistance applied, equal distribution noted Equal grip strength bilaterally Patient follows commands well Patient responds to questions appropriate  Skin: Skin is warm and dry. No rash noted. She is not diaphoretic. No erythema.  Psychiatric: She has a normal mood and affect. Her behavior is normal. Thought  content normal.  Nursing note and vitals reviewed.   ED Course  Procedures (including critical care time)  Results for orders placed or performed during the hospital encounter of 05/15/14  CBC  Result Value Ref Range   WBC 5.7 4.0 - 10.5 K/uL   RBC 4.97 3.87 - 5.11 MIL/uL   Hemoglobin 14.3 12.0 - 15.0 g/dL   HCT 16.142.8 09.636.0 - 04.546.0 %   MCV 86.1 78.0 - 100.0 fL   MCH 28.8 26.0 - 34.0 pg   MCHC 33.4 30.0 - 36.0 g/dL   RDW 40.912.8 81.111.5 - 91.415.5 %   Platelets 336 150 - 400 K/uL  Basic metabolic panel  Result Value Ref Range   Sodium 137 135 - 145 mmol/L   Potassium 3.3 (L) 3.5 - 5.1 mmol/L   Chloride 100 96 - 112 mmol/L   CO2 27 19 - 32 mmol/L   Glucose, Bld 111 (H) 70 - 99 mg/dL   BUN 6 6 - 23 mg/dL   Creatinine, Ser 7.820.67 0.50 - 1.10 mg/dL   Calcium 9.6 8.4 - 95.610.5 mg/dL   GFR calc non Af Amer >90 >90 mL/min   GFR calc Af Amer >90 >90 mL/min   Anion gap 10 5 - 15  BNP (order  ONLY if patient complains of dyspnea/SOB AND you have documented it for THIS visit)  Result Value Ref Range   B Natriuretic Peptide 6.1 0.0 - 100.0 pg/mL  D-dimer, quantitative  Result Value Ref Range   D-Dimer, Quant 0.28 0.00 - 0.48 ug/mL-FEU  I-stat troponin, ED (not at [facility])  Result Value Ref Range   Troponin i, poc 0.01 0.00 - 0.08 ng/mL   Comment 3          I-stat troponin, ED  Result Value Ref Range   Troponin i, poc 0.00 0.00 - 0.08 ng/mL   Comment 3           Labs Review Labs Reviewed  BASIC METABOLIC PANEL - Abnormal; Notable for the following:    Potassium 3.3 (*)    Glucose, Bld 111 (*)    All other components within normal limits  CBC  BRAIN NATRIURETIC PEPTIDE  D-DIMER, QUANTITATIVE  I-STAT TROPOININ, ED  I-STAT TROPOININ, ED  Rosezena Sensor, ED    Imaging Review Dg Chest 2 View  05/15/2014   CLINICAL DATA:  Chest pain for 3 days with chills  EXAM: CHEST  2 VIEW  COMPARISON:  May 13, 2014  FINDINGS: Lungs are clear. Heart size and pulmonary vascularity are normal. No adenopathy.  No bone lesions. No pneumothorax.  IMPRESSION: No edema or consolidation.   Electronically Signed   By: Bretta Bang III M.D.   On: 05/15/2014 14:08     EKG Interpretation   Date/Time:  Thursday May 15 2014 12:33:47 EDT Ventricular Rate:  92 PR Interval:  144 QRS Duration: 64 QT Interval:  358 QTC Calculation: 442 R Axis:   80 Text Interpretation:  Normal sinus rhythm Normal ECG no significant change  since earlier in the day Confirmed by GOLDSTON  MD, SCOTT ([id number]) on  05/15/2014 4:38:22 PM      MDM   Final diagnoses:  Chest pain, unspecified chest pain type  Chest wall pain    Medications  sodium chloride 0.9 % bolus 1,000 mL (1,000 mLs Intravenous New Bag/Given 05/15/14 1709)  acetaminophen (TYLENOL) tablet 325 mg (325 mg Oral Given 05/15/14 1753)    Filed Vitals:   05/15/14 1238 05/15/14 1730 05/15/14 1800 05/15/14 1812  BP: 114/82 121/79 114/72 114/72  Pulse: 99 76 86 84  Temp: 98.2 F (36.8 C)     TempSrc: Oral     Resp: Height: 5' (1.524 m)     Weight: 130 lb (58.968 kg)     SpO2: 100% 100% 99% 100%    EKG noted sinus rhythm with a heart rate of 92 bpm-he can change since earlier today. I-STAT troponin negative elevation. Second i-STAT troponin negative elevation. D-dimer negative elevation. BNP negative elevation. CBC negative elevated leukocytosis. Hemoglobin 14.3, hematocrit 42.8. BMP noted mildly low potassium of 3.3. Glucose 111 with negative elevation in anion gap. Chest x-ray noted no edema or consolidation. Doubt PE-negative elevated d-dimer. Negative findings of fluid overload-doubt CHF. Doubt ACS, chest pain has been ongoing for approximately 3 days with negative elevated troponin. HEART score 1. Pain is reproducible upon palpation to the chest wall, suspicion to be muscular pain. Patient stable, afebrile. Patient not septic appearing. Negative signs of respiratory distress. Patient denied chest pain while in ED setting. Patient able to  tolerate food without difficulty-negative episodes of emesis in ED setting. Discharged patient. Referred patient to PCP. Discussed with patient to rest and stay hydrated. Discussed with patient to massage icy hot  ointment apply warm compressions. Discussed with patient to closely monitor symptoms and if symptoms are to worsen or change to report back to the ED - strict return instructions given.  Patient agreed to plan of care, understood, all questions answered.   Raymon Mutton, PA-C 05/15/14 1928  [address], PA-C 05/15/14 1934  Pricilla Loveless, MD 05/17/14 [phone]

## 2014-07-23 ENCOUNTER — Encounter (HOSPITAL_COMMUNITY): Payer: Self-pay | Admitting: Emergency Medicine

## 2014-07-23 ENCOUNTER — Emergency Department (HOSPITAL_COMMUNITY)
Admission: EM | Admit: 2014-07-23 | Discharge: 2014-07-24 | Disposition: A | Discharge status: Home / Self Care | Payer: Medicaid Other | Attending: Emergency Medicine | Admitting: Emergency Medicine

## 2014-07-23 DIAGNOSIS — F329 Major depressive disorder, single episode, unspecified: Secondary | ICD-10-CM | POA: Diagnosis not present

## 2014-07-23 DIAGNOSIS — Z79899 Other long term (current) drug therapy: Secondary | ICD-10-CM | POA: Insufficient documentation

## 2014-07-23 DIAGNOSIS — Z880 Allergy status to penicillin: Secondary | ICD-10-CM | POA: Insufficient documentation

## 2014-07-23 DIAGNOSIS — I10 Essential (primary) hypertension: Secondary | ICD-10-CM | POA: Insufficient documentation

## 2014-07-23 DIAGNOSIS — F419 Anxiety disorder, unspecified: Secondary | ICD-10-CM | POA: Diagnosis not present

## 2014-07-23 DIAGNOSIS — Z862 Personal history of diseases of the blood and blood-forming organs and certain disorders involving the immune mechanism: Secondary | ICD-10-CM | POA: Diagnosis not present

## 2014-07-23 DIAGNOSIS — Z720 Tobacco use: Secondary | ICD-10-CM | POA: Diagnosis not present

## 2014-07-23 DIAGNOSIS — F32A Depression, unspecified: Secondary | ICD-10-CM

## 2014-07-23 LAB — RAPID URINE DRUG SCREEN, HOSP PERFORMED
Amphetamines: NOT DETECTED
BENZODIAZEPINES: NOT DETECTED
Barbiturates: NOT DETECTED
Cocaine: NOT DETECTED
OPIATES: NOT DETECTED
Tetrahydrocannabinol: POSITIVE — AB

## 2014-07-23 LAB — ETHANOL: Alcohol, Ethyl (B): <5 mg/dL (ref <5)

## 2014-07-23 LAB — COMPREHENSIVE METABOLIC PANEL
ALT: 13 U/L — ABNORMAL LOW (ref 14–54)
AST: 19 U/L (ref 15–41)
Albumin: 4.1 g/dL (ref 3.5–5.0)
Alkaline Phosphatase: 47 U/L (ref 38–126)
Anion gap: 10 (ref 5–15)
BUN: 7 mg/dL (ref 6–20)
CO2: 25 mmol/L (ref 22–32)
Calcium: 9.1 mg/dL (ref 8.9–10.3)
Chloride: 102 mmol/L (ref 101–111)
Creatinine, Ser: 0.68 mg/dL (ref 0.44–1.00)
GFR calc non Af Amer: >60 mL/min (ref >60)
Glucose, Bld: 94 mg/dL (ref 65–99)
Potassium: 3.4 mmol/L — ABNORMAL LOW (ref 3.5–5.1)
SODIUM: 137 mmol/L (ref 135–145)
TOTAL PROTEIN: 6.7 g/dL (ref 6.5–8.1)
Total Bilirubin: 0.2 mg/dL — ABNORMAL LOW (ref 0.3–1.2)

## 2014-07-23 LAB — CBC WITH DIFFERENTIAL/PLATELET
BASOS ABS: 0 10*3/uL (ref 0.0–0.1)
Basophils Relative: 0 % (ref 0–1)
EOS PCT: 1 % (ref 0–5)
Eosinophils Absolute: 0.1 10*3/uL (ref 0.0–0.7)
HEMATOCRIT: 36.3 % (ref 36.0–46.0)
Hemoglobin: 12.2 g/dL (ref 12.0–15.0)
Lymphocytes Relative: 38 % (ref 12–46)
Lymphs Abs: 2.9 10*3/uL (ref 0.7–4.0)
MCH: 28.3 pg (ref 26.0–34.0)
MCHC: 33.6 g/dL (ref 30.0–36.0)
MCV: 84.2 fL (ref 78.0–100.0)
MONO ABS: 0.4 10*3/uL (ref 0.1–1.0)
Monocytes Relative: 5 % (ref 3–12)
Neutro Abs: 4.3 10*3/uL (ref 1.7–7.7)
Neutrophils Relative %: 56 % (ref 43–77)
Platelets: 366 10*3/uL (ref 150–400)
RBC: 4.31 MIL/uL (ref 3.87–5.11)
RDW: 13.5 % (ref 11.5–15.5)
WBC: 7.6 10*3/uL (ref 4.0–10.5)

## 2014-07-23 MED ORDER — IBUPROFEN 400 MG PO TABS: 600.0000 mg | ORAL_TABLET | Freq: Three times a day (TID) | ORAL | Status: DC | PRN

## 2014-07-23 MED ORDER — ZOLPIDEM TARTRATE 5 MG PO TABS: 5.0000 mg | ORAL_TABLET | Freq: Every evening | ORAL | Status: DC | PRN

## 2014-07-23 MED ORDER — NICOTINE 21 MG/24HR TD PT24: 21.0000 mg | MEDICATED_PATCH | Freq: Every day | TRANSDERMAL | Status: DC

## 2014-07-23 MED ORDER — ALUM & MAG HYDROXIDE-SIMETH 200-200-20 MG/5ML PO SUSP: 30.0000 mL | ORAL | Status: DC | PRN

## 2014-07-23 MED ORDER — LORAZEPAM 0.5 MG PO TABS: 1.0000 mg | ORAL_TABLET | Freq: Three times a day (TID) | ORAL | Status: DC | PRN

## 2014-07-23 MED ORDER — ONDANSETRON HCL 4 MG PO TABS: 4.0000 mg | ORAL_TABLET | Freq: Three times a day (TID) | ORAL | Status: DC | PRN

## 2014-07-23 NOTE — ED Provider Notes (Signed)
Care assumed at shift change from [facility]. Awaiting TTS consultation and recommendations. Not having SI/HI, needs eval for possible outpt management for depression.   Physical Exam  BP 126/89 mmHg  Pulse 100  Temp(Src) 98.2 F (36.8 C)  Resp 20  Wt 130 lb (58.968 kg)  SpO2 100%  Physical Exam Gen: afebrile, VSS, NAD HEENT: EOMI, MMM Resp: no resp distress CV: rate WNL Abd: appearance normal, nondistended MsK: moving all extremities with ease Neuro: A&O x4  ED Course  Procedures Results for orders placed or performed during the hospital encounter of 07/23/14  CBC with Differential  Result Value Ref Range   WBC 7.6 4.0 - 10.5 K/uL   RBC 4.31 3.87 - 5.11 MIL/uL   Hemoglobin 12.2 12.0 - 15.0 g/dL   HCT 69.6 29.5 - 28.4 %   MCV 84.2 78.0 - 100.0 fL   MCH 28.3 26.0 - 34.0 pg   MCHC 33.6 30.0 - 36.0 g/dL   RDW 13.2 44.0 - 10.2 %   Platelets 366 150 - 400 K/uL   Neutrophils Relative % 56 43 - 77 %   Neutro Abs 4.3 1.7 - 7.7 K/uL   Lymphocytes Relative 38 12 - 46 %   Lymphs Abs 2.9 0.7 - 4.0 K/uL   Monocytes Relative 5 3 - 12 %   Monocytes Absolute 0.4 0.1 - 1.0 K/uL   Eosinophils Relative 1 0 - 5 %   Eosinophils Absolute 0.1 0.0 - 0.7 K/uL   Basophils Relative 0 0 - 1 %   Basophils Absolute 0.0 0.0 - 0.1 K/uL  Comprehensive metabolic panel  Result Value Ref Range   Sodium 137 135 - 145 mmol/L   Potassium 3.4 (L) 3.5 - 5.1 mmol/L   Chloride 102 101 - 111 mmol/L   CO2 25 22 - 32 mmol/L   Glucose, Bld 94 65 - 99 mg/dL   BUN 7 6 - 20 mg/dL   Creatinine, Ser 7.25 0.44 - 1.00 mg/dL   Calcium 9.1 8.9 - 36.6 mg/dL   Total Protein 6.7 6.5 - 8.1 g/dL   Albumin 4.1 3.5 - 5.0 g/dL   AST 19 15 - 41 U/L   ALT 13 (L) 14 - 54 U/L   Alkaline Phosphatase 47 38 - 126 U/L   Total Bilirubin 0.2 (L) 0.3 - 1.2 mg/dL   GFR calc non Af Amer >60 >60 mL/min   GFR calc Af Amer >60 >60 mL/min   Anion gap 10 5 - 15  Ethanol  Result Value Ref Range   Alcohol, Ethyl (B) <5 <5 mg/dL   Urine rapid drug screen (hosp performed)not at [facility]  Result Value Ref Range   Opiates NONE DETECTED NONE DETECTED   Cocaine NONE DETECTED NONE DETECTED   Benzodiazepines NONE DETECTED NONE DETECTED   Amphetamines NONE DETECTED NONE DETECTED   Tetrahydrocannabinol POSITIVE (A) NONE DETECTED   Barbiturates NONE DETECTED NONE DETECTED   No results found.   Meds ordered this encounter  Medications  . LORazepam (ATIVAN) tablet 1 mg    Sig:   . ibuprofen (ADVIL,MOTRIN) tablet 600 mg    Sig:   . zolpidem (AMBIEN) tablet 5 mg    Sig:   . nicotine (NICODERM CQ - dosed in mg/24 hours) patch 21 mg    Sig:   . ondansetron (ZOFRAN) tablet 4 mg    Sig:   . alum & mag hydroxide-simeth (MAALOX/MYLANTA) 200-200-20 MG/5ML suspension 30 mL    Sig:  MDM:   ICD-9-CM ICD-10-CM   1. Depression 311 F32.9     12:29 AM Pt denies SI/HI. Behavioral team evaluated pt and stated she's clear for d/c with outpt guide, which they gave her. She signed no harm contract and understands what this implies. Will f/up with resources given to her. Feels happy with these instructions. I explained the diagnosis and have given explicit precautions to return to the ER including for any other new or worsening symptoms. The patient understands and accepts the medical plan as it's been dictated and I have answered their questions. Discharge instructions concerning home care and prescriptions have been given. The patient is STABLE and is discharged to home in good condition.    Leolia Vinzant Camprubi-Soms, PA-C 07/24/14 0032  Mancel BaleElliott Wentz, MD 07/24/14 [phone]

## 2014-07-23 NOTE — ED Notes (Signed)
Pt. sequesting psychiatric evaluation for her anxiety, depression , multiple emotional stressors onset this week , denies suicidal ideation / no hallucinations .

## 2014-07-23 NOTE — ED Provider Notes (Signed)
CSN: [id number]     Arrival date & time 07/23/14  2015 History  This chart was scribed for non-physician practitioner, Celene Skeenobyn Jaleah Lefevre, working with Mancel BaleElliott Wentz, MD by Richarda Overlieichard Holland, ED Scribe. This patient was seen in room TR01C/TR01C and the patient's care was started at 9:35 PM.  Chief Complaint  Patient presents with  . Anxiety  . Depression   The history is provided by the patient. No language interpreter was used.   HPI Comments: Rachel Fischer is a 44 y.o. female who presents to the Emergency Department complaining of worsening anxiety this past week. Pt states that her body was shaking, she had a HA and says she thinks she was having a "nervous breakdown" earlier. Pt states that she thinks she has been depressed recently. She states that her job and living situation are stressful because she is living apart from her son because she was evicted. Pt says that she has noticed she has been sleeping less and has noticed a decrease in her appetite as well. Unable to concentrate at work. Pt states that she has been using marijuana. Pt states that she thinks she has been depressed in the past but has never been dx. Pt denies SI or HI.  Past Medical History  Diagnosis Date  . Hypertension   . Migraine headache   . Lung nodule   . Tobacco use   . Anemia    Past Surgical History  Procedure Laterality Date  . Abdominal hysterectomy    . C section x 4    . Foot surgery     Family History  Problem Relation Age of Onset  . Breast cancer Mother   . Hypertension Mother   . Hyperlipidemia Mother   . CAD Mother    History  Substance Use Topics  . Smoking status: Current Every Day Smoker -- 0.50 packs/day for 19 years    Types: Cigarettes  . Smokeless tobacco: Not on file  . Alcohol Use: No   OB History    No data available     Review of Systems  Psychiatric/Behavioral: Positive for dysphoric mood and decreased concentration. Negative for suicidal ideas. The patient is  nervous/anxious.   All other systems reviewed and are negative.     Allergies  Ciprofloxacin; Morphine and related; and Penicillins  Home Medications   Prior to Admission medications   Medication Sig Start Date End Date Taking? Authorizing Provider  albuterol (PROVENTIL HFA;VENTOLIN HFA) 108 (90 BASE) MCG/ACT inhaler Inhale 2 puffs into the lungs every 4 (four) hours as needed for wheezing or shortness of breath. 01/01/14   Harle BattiestElizabeth Tysinger, NP  HYDROcodone-acetaminophen (NORCO) 5-325 MG per tablet Take 1 tablet by mouth every 4 (four) hours as needed. 04/29/14   Mancel BaleElliott Wentz, MD  lisinopril-hydrochlorothiazide (PRINZIDE,ZESTORETIC) 10-12.5 MG per tablet Take 1 tablet by mouth daily.    Historical Provider, MD   BP 126/89 mmHg  Pulse 100  Temp(Src) 98.2 F (36.8 C)  Resp 20  Wt 130 lb (58.968 kg)  SpO2 100% Physical Exam  Constitutional: She is oriented to person, place, and time. She appears well-developed and well-nourished. No distress.  HENT:  Head: Normocephalic and atraumatic.  Mouth/Throat: Oropharynx is clear and moist.  Eyes: Conjunctivae and EOM are normal.  Neck: Normal range of motion. Neck supple.  Cardiovascular: Normal rate, regular rhythm and normal heart sounds.   Pulmonary/Chest: Effort normal and breath sounds normal. No respiratory distress.  Musculoskeletal: Normal range of motion. She exhibits no edema.  Neurological: She is alert and oriented to person, place, and time. No sensory deficit.  Skin: Skin is warm and dry.  Psychiatric: Her behavior is normal. She exhibits a depressed mood. She expresses no homicidal and no suicidal ideation.  Tearful.  Nursing note and vitals reviewed.   ED Course  Procedures  DIAGNOSTIC STUDIES: Oxygen Saturation is 100% on RA, normal by my interpretation.    COORDINATION OF CARE: 9:41 PM Discussed treatment plan with pt at bedside and pt agreed to plan.  Labs Review Labs Reviewed  COMPREHENSIVE METABOLIC PANEL  - Abnormal; Notable for the following:    Potassium 3.4 (*)    ALT 13 (*)    Total Bilirubin 0.2 (*)    All other components within normal limits  URINE RAPID DRUG SCREEN (HOSP PERFORMED) NOT AT [facility] - Abnormal; Notable for the following:    Tetrahydrocannabinol POSITIVE (*)    All other components within normal limits  CBC WITH DIFFERENTIAL/PLATELET  ETHANOL    Imaging Review No results found.   EKG Interpretation None      MDM   Final diagnoses:  Depression   Nontoxic appearing, NAD. No SI/HI. Medically cleared. TTS consult complete, disposition pending counselor discussion with [facility] physician extender. Pt signed out to [employer], PA-C.  I personally performed the services described in this documentation, which was scribed in my presence. The recorded information has been reviewed and is accurate.  Kathrynn Speed, PA-C 07/23/14 2253  Kathrynn Speed, PA-C 07/23/14 2257  Mancel Bale, MD 07/24/14 [phone]

## 2014-07-23 NOTE — ED Notes (Signed)
Tele-psych at bedside.
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
## 2014-07-24 NOTE — Discharge Instructions (Signed)
You were evaluated here and you signed a "no-harm" contract. Use the resource guide below and the one given to you here to find a treatment facility outpatient for your depression/anxiety. Return to the ER for changes or worsening symptoms.   Depression Depression refers to feeling sad, low, down in the dumps, blue, gloomy, or empty. In general, there are two kinds of depression:  Normal sadness or normal grief. This kind of depression is one that we all feel from time to time after upsetting life experiences, such as the loss of a job or the ending of a relationship. This kind of depression is considered normal, is short lived, and resolves within a few days to 2 weeks. Depression experienced after the loss of a loved one (bereavement) often lasts longer than 2 weeks but normally gets better with time.  Clinical depression. This kind of depression lasts longer than normal sadness or normal grief or interferes with your ability to function at home, at work, and in school. It also interferes with your personal relationships. It affects almost every aspect of your life. Clinical depression is an illness. Symptoms of depression can also be caused by conditions other than those mentioned above, such as:  Physical illness. Some physical illnesses, including underactive thyroid gland (hypothyroidism), severe anemia, specific types of cancer, diabetes, uncontrolled seizures, heart and lung problems, strokes, and chronic pain are commonly associated with symptoms of depression.  Side effects of some prescription medicine. In some people, certain types of medicine can cause symptoms of depression.  Substance abuse. Abuse of alcohol and illicit drugs can cause symptoms of depression. SYMPTOMS Symptoms of normal sadness and normal grief include the following:  Feeling sad or crying for short periods of time.  Not caring about anything (apathy).  Difficulty sleeping or sleeping too much.  No longer able  to enjoy the things you used to enjoy.  Desire to be by oneself all the time (social isolation).  Lack of energy or motivation.  Difficulty concentrating or remembering.  Change in appetite or weight.  Restlessness or agitation. Symptoms of clinical depression include the same symptoms of normal sadness or normal grief and also the following symptoms:  Feeling sad or crying all the time.  Feelings of guilt or worthlessness.  Feelings of hopelessness or helplessness.  Thoughts of suicide or the desire to harm yourself (suicidal ideation).  Loss of touch with reality (psychotic symptoms). Seeing or hearing things that are not real (hallucinations) or having false beliefs about your life or the people around you (delusions and paranoia). DIAGNOSIS  The diagnosis of clinical depression is usually based on how bad the symptoms are and how long they have lasted. Your health care provider will also ask you questions about your medical history and substance use to find out if physical illness, use of prescription medicine, or substance abuse is causing your depression. Your health care provider may also order blood tests. TREATMENT  Often, normal sadness and normal grief do not require treatment. However, sometimes antidepressant medicine is given for bereavement to ease the depressive symptoms until they resolve. The treatment for clinical depression depends on how bad the symptoms are but often includes antidepressant medicine, counseling with a mental health professional, or both. Your health care provider will help to determine what treatment is best for you. Depression caused by physical illness usually goes away with appropriate medical treatment of the illness. If prescription medicine is causing depression, talk with your health care provider about stopping the medicine,  decreasing the dose, or changing to another medicine. Depression caused by the abuse of alcohol or illicit drugs goes  away when you stop using these substances. Some adults need professional help in order to stop drinking or using drugs. SEEK IMMEDIATE MEDICAL CARE IF:  You have thoughts about hurting yourself or others.  You lose touch with reality (have psychotic symptoms).  You are taking medicine for depression and have a serious side effect. FOR MORE INFORMATION  National Alliance on Mental Illness: www.nami.[employer] of Mental Health: http://www.maynard.net/ Document Released: 02/05/2000 Document Revised: 06/24/2013 Document Reviewed: 05/09/2011 [facility] Patient Information 2015 [place], [place]. This information is not intended to replace advice given to you by your health care provider. Make sure you discuss any questions you have with your health care provider.  Suicidal Feelings, How to Help Yourself Everyone feels sad or unhappy at times, but depressing thoughts and feelings of hopelessness can lead to thoughts of suicide. It can seem as if life is too tough to handle. If you feel as though you have reached the point where suicide is the only answer, it is time to let someone know immediately.  HOW TO COPE AND PREVENT SUICIDE  Let family, friends, teachers, or counselors know. Get help. Try not to isolate yourself from those who care about you. Even though you may not feel sociable, talk with someone every day. It is best if it is face-to-face. Remember, they will want to help you.  Eat a regularly spaced and well-balanced diet.  Get plenty of rest.  Avoid alcohol and drugs because they will only make you feel worse and may also lower your inhibitions. Remove them from the home. If you are thinking of taking an overdose of your prescribed medicines, give your medicines to someone who can give them to you one day at a time. If you are on antidepressants, let your caregiver know of your feelings so he or she can provide a safer medicine, if that is a concern.  Remove weapons or poisons  from your home.  Try to stick to routines. Follow a schedule and remind yourself that you have to keep that schedule every day.  Set some realistic goals and achieve them. Make a list and cross things off as you go. Accomplishments give a sense of worth. Wait until you are feeling better before doing things you find difficult or unpleasant to do.  If you are able, try to start exercising. Even half-hour periods of exercise each day will make you feel better. Getting out in the sun or into nature helps you recover from depression faster. If you have a favorite place to walk, take advantage of that.  Increase safe activities that have always given you pleasure. This may include playing your favorite music, reading a good book, painting a picture, or playing your favorite instrument. Do whatever takes your mind off your depression.  Keep your living space well-lighted. GET HELP Contact a suicide hotline, crisis center, or local suicide prevention center for help right away. Local centers may include a hospital, clinic, community service organization, social service provider, or health department.  Call your local emergency services (911 in the [place]).  Call a suicide hotline:  1-800-273-TALK (762-496-5115) in the [place].  1-800-SUICIDE 336-363-4963) in the [place].  (647)335-0766 in the [place] for Spanish-speaking counselors.  4-696-295-2WUX 7130367164) in the [place] for TTY users.  Visit the following websites for information and help:  National Suicide Prevention Lifeline:  www.suicidepreventionlifeline.org  Hopeline: www.hopeline.com  [employer] for Suicide Prevention: https://www.ayers.com/www.afsp.org  For lesbian, gay, bisexual, transgender, or questioning youth, contact The [employer] Project:  4-098-1-X-BJYNWG1-866-4-U-TREVOR 220 815 2782(1-(979) 538-7185) in the [place].  www.thetrevorproject.org  In [place], treatment resources are listed in each province with  listings available under [employer] for [employer] Services or similar titles. Another source for Crisis Centres by [place] is located at http://www.suicideprevention.ca/in-crisis-now/find-a-crisis-centre-now/crisis-centres Document Released: 08/14/2002 Document Revised: 05/02/2011 Document Reviewed: 06/04/2013 [facility] Patient Information 2015 [place], [place]. This information is not intended to replace advice given to you by your health care provider. Make sure you discuss any questions you have with your health care provider. Emergency Department Resource Guide 1) Find a Doctor and Pay Out of Pocket Although you won't have to find out who is covered by your insurance plan, it is a good idea to ask around and get recommendations. You will then need to call the office and see if the doctor you have chosen will accept you as a new patient and what types of options they offer for patients who are self-pay. Some doctors offer discounts or will set up payment plans for their patients who do not have insurance, but you will need to ask so you aren't surprised when you get to your appointment.  2) Contact Your Local Health Department Not all health departments have doctors that can see patients for sick visits, but many do, so it is worth a call to see if yours does. If you don't know where your local health department is, you can check in your phone book. The CDC also has a tool to help you locate your state's health department, and many state websites also have listings of all of their local health departments.  3) Find a Walk-in Clinic If your illness is not likely to be very severe or complicated, you may want to try a walk in clinic. These are popping up all over the country in pharmacies, drugstores, and shopping centers. They're usually staffed by nurse practitioners or physician assistants that have been trained to treat common illnesses and complaints. They're usually fairly quick and inexpensive.  However, if you have serious medical issues or chronic medical problems, these are probably not your best option.  No Primary Care Doctor: - Call Health Connect at  [phone] - they can help you locate a primary care doctor that  accepts your insurance, provides certain services, etc. - Physician Referral Service- [phone]  Chronic Pain Problems: Organization         Address  Phone   Notes  [facility]  [phone] Patients need to be referred by their primary care doctor.   Medication Assistance: Organization         Address  Phone   Notes  [facility] [address] [place], [place] [id number] [phone]) [phone] --Must be a resident of [facility] -- Must have NO insurance coverage whatsoever (no Medicaid/ Medicare, etc.) -- The pt. MUST have a primary care doctor that directs their care regularly and follows them in the community   MedAssist  [phone]([phone]) [phone]   United Way  814-672-0050(888) 337-552-8876    Behavioral Health Resources in the Community: Intensive Outpatient Programs Organization         Address  Phone  Notes  [facility] [address], [place], [place] [phone]   [facility] [address], [place], [place] [phone]   ADS: Alcohol &  Drug Svcs [address] [place], [place]  [phone]   [facility] Mental Health [address] [place],  [place], [place] [phone] or [phone]   Substance Abuse Resources Organization         Address  Phone  Notes  Alcohol and Drug Services  [phone]   [facility]  [phone]   [place]  [phone]   Floydene Flock  ([phone]   Residential & Outpatient Substance Abuse Program  [phone]   Psychological Services Organization         Address  Phone  Notes  [facility] Health  [phone]   [facility] Services  ([phone]   [facility] Mental Health [address], [place] [phone] or [phone]    Mobile Crisis Teams Organization         Address  Phone  Notes  [facility], Mobile Crisis Care Unit  [phone]   [facility]  [address] [place], [place] [phone]   Doristine Locks [address] [place] [phone]    Self-Help/Support Groups Organization         Address  Phone             Notes  Mental Health Assoc. of [place] - variety of support groups  336- I7437963 Call for more information  Narcotics Anonymous (NA), Caring Services [address], [employer] [place]  2 meetings at this location   Statistician         Address  Phone  Notes  [facility] [address],    [place]  [phone]   [facility]  [address], [place] [address], [place], [place] [phone]   [facility] Treatment Facility [address] [place], [place] [phone] Admissions: 8am-3pm M-F  [facility] [address]    [place], [place] [phone]   [facility] [address] [place], [place], [place] [phone]   [facility] [address]  [place], [place] [phone]   [facility] - Intensive Outpatient [address], [place], [place] [phone]   [facility] (Addiction Recovery [facility].) [address]  [place], [place] [phone] or [phone]   [facility] Services (RTS) [address] [place], [place] [phone] Accepts Medicaid  [facility] [place] [address],  [place] [phone] Substance Abuse/Addiction Treatment   [facility] Organization         Address  Phone  Notes  [facility]  ([phone])[phone]   Angie Fava, PhD [address] Ervin Knack [place], [place]   [phone] or [phone]) [phone]   [facility] Behavioral   [address] [place], [place] [phone]     [facility] [address] [place], [place] [phone] Insurance/Medicaid/sponsorship through [facility] and Families [address]                                    [place], [place] [phone] Therapy/tele-psych/case  [facility] [address], [place] [phone]    Dr. Lolly Mustache  [phone]   [facility]  United Way [facility] Dept. 1) [address] [place] 2) [address] [place] 3)  [address], [place] [phone]  [phone]  [facility] Child Abuse Hotline 620-685-0212 or 7168532908 (After Hours)      Behavioral Health Resources in the [facility]  Intensive Outpatient Programs: [facility]      [address] [place], [place] [phone] Both a day and evening program       [facility] Outpatient     [address]        [place], [place] [address] [phone]         ADS: Alcohol & Drug Svcs [address] [place] [phone]  [facility] Mental Health ACCESS LINE: [phone] or [phone] [address] [place], [place] [address] [email or web address]  Mobile Crisis Teams:                                        [facility]         Mobile Crisis Care Unit [phone]             [facility] [address] Ginette Otto [phone]                                         Interventionist [address] [place] [phone]  Self-Help/Support Groups: Mental Health Assoc. of [employer] of support groups [phone]) [phone] (call for more info)  Narcotics Anonymous (NA) [facility] [address] [place] - 2 meetings at this location  Residential Treatment Programs:  [facility]      [address]        [place]       [phone]         [facility] [address], [place] [address]10 [place], [place]  [address] [phone]  [facility]  [address] [place], [place] [address] [phone] Admissions: 8am-3pm M-F  [facility]     [address]        [place], [place] [address]       [phone]         [facility] [address] Manns [place], [place] [phone]  [facility] [address] [place], [place] [phone]  [facility] - Intensive Outpatient      [address]     [place], [place]       [phone]         [facility] (Addiction Recovery Care [facility]oc.)     [address] [place], [place] [phone] or [phone]  [facility] Services ([facility]S)  [address] [place], [place] [phone]  [facility] [address] [place] [place]                                               [address] [place] [phone]  [facility] Resources: Family Dollar [phone]               General Therapy  Angie Fava, PhD        [address] [place], [place] [address]         [phone]   Insurance  [facility] Behavioral   [address] [place], [place] [address] [phone]  [facility] [address] [place], [place] [address] [phone] Insurance/Medicaid/sponsorship through [facility] and Families                                              [address]                                        [place], [place] [address]    Therapy/tele-psych/case         [phone]          [facility] [address], [place]  [address]  Adolescent/group home/case management [phone]                                           Creola Corn PhD       General therapy       Insurance   [phone]         Dr. Lolly Mustache Insurance [phone] M-F  [place] [facility] Medicaid, sponsorship [phone]

## 2014-07-24 NOTE — ED Notes (Signed)
Received call from behavorial, patient is to complete contract for no harm, and planning for discharge. France RavensMercedes, PA-C, aware.

## 2014-08-04 ENCOUNTER — Other Ambulatory Visit: Payer: Self-pay | Admitting: Internal Medicine

## 2014-08-04 DIAGNOSIS — N6310 Unspecified lump in the right breast, unspecified quadrant: Secondary | ICD-10-CM

## 2014-08-08 ENCOUNTER — Ambulatory Visit
Admission: RE | Admit: 2014-08-08 | Discharge: 2014-08-08 | Disposition: A | Discharge status: Home / Self Care | Payer: Medicaid Other | Source: Ambulatory Visit | Attending: Internal Medicine | Admitting: Internal Medicine

## 2014-08-08 DIAGNOSIS — N6310 Unspecified lump in the right breast, unspecified quadrant: Secondary | ICD-10-CM

## 2014-10-10 ENCOUNTER — Emergency Department (HOSPITAL_COMMUNITY)
Admission: EM | Admit: 2014-10-10 | Discharge: 2014-10-10 | Disposition: A | Discharge status: Home / Self Care | Payer: Medicaid Other | Attending: Emergency Medicine | Admitting: Emergency Medicine

## 2014-10-10 ENCOUNTER — Encounter (HOSPITAL_COMMUNITY): Payer: Self-pay | Admitting: Emergency Medicine

## 2014-10-10 DIAGNOSIS — Z720 Tobacco use: Secondary | ICD-10-CM | POA: Insufficient documentation

## 2014-10-10 DIAGNOSIS — Z880 Allergy status to penicillin: Secondary | ICD-10-CM | POA: Insufficient documentation

## 2014-10-10 DIAGNOSIS — Z79899 Other long term (current) drug therapy: Secondary | ICD-10-CM | POA: Diagnosis not present

## 2014-10-10 DIAGNOSIS — I10 Essential (primary) hypertension: Secondary | ICD-10-CM | POA: Insufficient documentation

## 2014-10-10 DIAGNOSIS — R52 Pain, unspecified: Secondary | ICD-10-CM | POA: Diagnosis not present

## 2014-10-10 DIAGNOSIS — G43909 Migraine, unspecified, not intractable, without status migrainosus: Secondary | ICD-10-CM | POA: Insufficient documentation

## 2014-10-10 DIAGNOSIS — N390 Urinary tract infection, site not specified: Secondary | ICD-10-CM | POA: Diagnosis not present

## 2014-10-10 DIAGNOSIS — Z862 Personal history of diseases of the blood and blood-forming organs and certain disorders involving the immune mechanism: Secondary | ICD-10-CM | POA: Insufficient documentation

## 2014-10-10 LAB — CBC WITH DIFFERENTIAL/PLATELET
Basophils Absolute: 0 10*3/uL (ref 0.0–0.1)
Basophils Relative: 0 % (ref 0–1)
Eosinophils Absolute: 0 10*3/uL (ref 0.0–0.7)
Eosinophils Relative: 0 % (ref 0–5)
HCT: 37.4 % (ref 36.0–46.0)
Hemoglobin: 12.6 g/dL (ref 12.0–15.0)
Lymphocytes Relative: 14 % (ref 12–46)
Lymphs Abs: 1.1 10*3/uL (ref 0.7–4.0)
MCH: 29.4 pg (ref 26.0–34.0)
MCHC: 33.7 g/dL (ref 30.0–36.0)
MCV: 87.2 fL (ref 78.0–100.0)
Monocytes Absolute: 0.7 10*3/uL (ref 0.1–1.0)
Monocytes Relative: 10 % (ref 3–12)
Neutro Abs: 5.6 10*3/uL (ref 1.7–7.7)
Neutrophils Relative %: 76 % (ref 43–77)
Platelets: 294 10*3/uL (ref 150–400)
RBC: 4.29 MIL/uL (ref 3.87–5.11)
RDW: 12.9 % (ref 11.5–15.5)
WBC: 7.4 10*3/uL (ref 4.0–10.5)

## 2014-10-10 LAB — BASIC METABOLIC PANEL
Anion gap: 8 (ref 5–15)
BUN: 7 mg/dL (ref 6–20)
CO2: 24 mmol/L (ref 22–32)
Calcium: 9.2 mg/dL (ref 8.9–10.3)
Chloride: 104 mmol/L (ref 101–111)
Creatinine, Ser: 0.75 mg/dL (ref 0.44–1.00)
GFR calc Af Amer: >60 mL/min (ref >60)
GFR calc non Af Amer: >60 mL/min (ref >60)
Glucose, Bld: 112 mg/dL — ABNORMAL HIGH (ref 65–99)
Potassium: 3.4 mmol/L — ABNORMAL LOW (ref 3.5–5.1)
Sodium: 136 mmol/L (ref 135–145)

## 2014-10-10 LAB — URINALYSIS, ROUTINE W REFLEX MICROSCOPIC
Bilirubin Urine: NEGATIVE
Glucose, UA: NEGATIVE mg/dL
Ketones, ur: 15 mg/dL — AB
Nitrite: POSITIVE — AB
Protein, ur: NEGATIVE mg/dL
Specific Gravity, Urine: 1.025 (ref 1.005–1.030)
Urobilinogen, UA: 1 mg/dL (ref 0.0–1.0)
pH: 7.5 (ref 5.0–8.0)

## 2014-10-10 LAB — CK: Total CK: 105 U/L (ref 38–234)

## 2014-10-10 LAB — MAGNESIUM: Magnesium: 2.2 mg/dL (ref 1.7–2.4)

## 2014-10-10 LAB — URINE MICROSCOPIC-ADD ON

## 2014-10-10 MED ORDER — SULFAMETHOXAZOLE-TRIMETHOPRIM 800-160 MG PO TABS: 1.0000 | ORAL_TABLET | Freq: Two times a day (BID) | ORAL | Status: AC

## 2014-10-10 MED ORDER — POTASSIUM CHLORIDE CRYS ER 20 MEQ PO TBCR
40.0000 meq | EXTENDED_RELEASE_TABLET | Freq: Once | ORAL | Status: AC
  Administered 2014-10-10: 40 meq via ORAL
  Filled 2014-10-10: qty 2

## 2014-10-10 MED ORDER — METHOCARBAMOL 500 MG PO TABS: 500.0000 mg | ORAL_TABLET | Freq: Three times a day (TID) | ORAL | Status: DC | PRN

## 2014-10-10 MED ORDER — FENTANYL CITRATE (PF) 100 MCG/2ML IJ SOLN
50.0000 ug | Freq: Once | INTRAMUSCULAR | Status: AC
  Administered 2014-10-10: 50 ug via INTRAVENOUS
  Filled 2014-10-10: qty 2

## 2014-10-10 MED ORDER — KETOROLAC TROMETHAMINE 15 MG/ML IJ SOLN
15.0000 mg | Freq: Once | INTRAMUSCULAR | Status: AC
  Administered 2014-10-10: 15 mg via INTRAVENOUS
  Filled 2014-10-10: qty 1

## 2014-10-10 MED ORDER — DIAZEPAM 5 MG/ML IJ SOLN
5.0000 mg | Freq: Once | INTRAMUSCULAR | Status: AC
Start: 2014-10-10 — End: 2014-10-10
  Administered 2014-10-10: 5 mg via INTRAVENOUS
  Filled 2014-10-10: qty 2

## 2014-10-10 MED ORDER — ONDANSETRON HCL 4 MG/2ML IJ SOLN
4.0000 mg | Freq: Once | INTRAMUSCULAR | Status: AC
  Administered 2014-10-10: 4 mg via INTRAVENOUS
  Filled 2014-10-10: qty 2

## 2014-10-10 MED ORDER — SULFAMETHOXAZOLE-TRIMETHOPRIM 800-160 MG PO TABS
1.0000 | ORAL_TABLET | Freq: Once | ORAL | Status: AC
  Administered 2014-10-10: 1 via ORAL
  Filled 2014-10-10: qty 1

## 2014-10-10 MED ORDER — SODIUM CHLORIDE 0.9 % IV BOLUS (SEPSIS)
1000.0000 mL | Freq: Once | INTRAVENOUS | Status: AC
  Administered 2014-10-10: 1000 mL via INTRAVENOUS

## 2014-10-10 NOTE — Discharge Instructions (Signed)

## 2014-10-10 NOTE — ED Notes (Signed)
Patient states yesterday after work her body started to ache.  She says that her hands, legs, and back hurt the most.  Patient afebrile.  States her eye has been bothering her x4 days and has some drainage.  Eye appears slightly discolored but not swollen.  Patient in no apparent distress at this time.

## 2014-10-10 NOTE — ED Provider Notes (Signed)
CSN: [id number]     Arrival date & time 10/10/14  0806 History   First MD Initiated Contact with Patient 10/10/14 [phone]     Chief Complaint  Patient presents with  . Generalized Body Aches     (Consider location/radiation/quality/duration/timing/severity/associated sxs/prior Treatment) HPI   44 year old female with generalized body aches. Gradual onset yesterday shortly after getting off of work. Complaining of pain essentially from head to toe, but worse in her thighs. Subjective fever. Mild nausea. No vomiting. Decreased urinary output and reports that her urine is much darker in color than it normally is. No respiratory complaints. No significant past medical history aside from hypertension. Additionally complaining of L eye irritation, redness and crusty discharge from it in the morning. This began 3-4 days ago.   Past Medical History  Diagnosis Date  . Hypertension   . Migraine headache   . Lung nodule   . Tobacco use   . Anemia    Past Surgical History  Procedure Laterality Date  . Abdominal hysterectomy    . C section x 4    . Foot surgery     Family History  Problem Relation Age of Onset  . Breast cancer Mother   . Hypertension Mother   . Hyperlipidemia Mother   . CAD Mother    Social History  Substance Use Topics  . Smoking status: Current Every Day Smoker -- 0.50 packs/day for 19 years    Types: Cigarettes  . Smokeless tobacco: None  . Alcohol Use: No   OB History    No data available     Review of Systems  All systems reviewed and negative, other than as noted in HPI.   Allergies  Ciprofloxacin; Morphine and related; and Penicillins  Home Medications   Prior to Admission medications   Medication Sig Start Date End Date Taking? Authorizing Provider  lisinopril-hydrochlorothiazide (PRINZIDE,ZESTORETIC) 20-12.5 MG per tablet Take 1 tablet by mouth daily. 08/29/14  Yes Historical Provider, MD  albuterol (PROVENTIL HFA;VENTOLIN HFA) 108 (90 BASE) MCG/ACT  inhaler Inhale 2 puffs into the lungs every 4 (four) hours as needed for wheezing or shortness of breath. Patient not taking: Reported on 10/10/2014 01/01/14   Harle Battiest, NP   There were no vitals taken for this visit. Physical Exam  Constitutional: She appears well-developed and well-nourished. No distress.  HENT:  Head: Normocephalic and atraumatic.  Eyes: Conjunctivae are normal. Right eye exhibits no discharge. Left eye exhibits no discharge.  Neck: Neck supple.  Cardiovascular: Regular rhythm and normal heart sounds.  Exam reveals no gallop and no friction rub.   No murmur heard. Mild tachycardia  Pulmonary/Chest: Effort normal and breath sounds normal. No respiratory distress.  Abdominal: Soft. She exhibits no distension. There is tenderness.  Mild epigastric tenderness w/o rebound or guarding  Musculoskeletal: She exhibits no edema or tenderness.  Neurological: She is alert.  Skin: Skin is warm and dry.  Psychiatric: She has a normal mood and affect. Her behavior is normal. Thought content normal.  Nursing note and vitals reviewed.   ED Course  Procedures (including critical care time) Labs Review Labs Reviewed  BASIC METABOLIC PANEL - Abnormal; Notable for the following:    Potassium 3.4 (*)    Glucose, Bld 112 (*)    All other components within normal limits  URINALYSIS, ROUTINE W REFLEX MICROSCOPIC (NOT AT [facility]) - Abnormal; Notable for the following:    APPearance CLOUDY (*)    Hgb urine dipstick MODERATE (*)    Ketones,  ur 15 (*)    Nitrite POSITIVE (*)    Leukocytes, UA TRACE (*)    All other components within normal limits  URINE MICROSCOPIC-ADD ON - Abnormal; Notable for the following:    Squamous Epithelial / LPF MANY (*)    Bacteria, UA MANY (*)    All other components within normal limits  URINE CULTURE  CK  CBC WITH DIFFERENTIAL/PLATELET  MAGNESIUM    Imaging Review No results found. I have personally reviewed and evaluated these images and  lab results as part of my medical decision-making.   EKG Interpretation None      MDM   Final diagnoses:  Body aches  UTI (lower urinary tract infection)    44yF with body aches. Afebrile. Hd stable. Nontoxic. Work consistent with uti. i feel appropriate for outpt tx. It has been determined that no acute conditions requiring further emergency intervention are present at this time. The patient has been advised of the diagnosis and plan. I reviewed any labs and imaging including any potential incidental findings. We have discussed signs and symptoms that warrant return to the ED and they are listed in the discharge instructions.      Raeford Razor, MD 10/15/14 [phone]

## 2014-10-12 LAB — URINE CULTURE: Culture: >=100000

## 2014-10-14 ENCOUNTER — Telehealth (HOSPITAL_COMMUNITY): Payer: Self-pay

## 2014-10-14 NOTE — Telephone Encounter (Signed)
Post ED Visit - Positive Culture Follow-up  Culture report reviewed by antimicrobial stewardship pharmacist:  Wes Dulaney, Pharm.D., BCPS  Celedonio Miyamoto, Pharm.D., BCPS  Georgina Pillion, Pharm.D., BCPS  [place], [address].D., BCPS, AAHIVP  Estella Husk, Pharm.D., BCPS, AAHIVP  Elder [place], [address].D., BCPS  Positive Urine culture, >/= 100,000 colonies -> E Coli Treated with Sulfa Trimeth, organism sensitive to the same and no further patient follow-up is required at this time.  Arvid Right 10/14/2014, 4:52 AM

## 2015-07-14 ENCOUNTER — Emergency Department (HOSPITAL_COMMUNITY): Payer: Self-pay

## 2015-07-14 ENCOUNTER — Emergency Department (HOSPITAL_COMMUNITY)
Admission: EM | Admit: 2015-07-14 | Discharge: 2015-07-14 | Disposition: A | Discharge status: Home / Self Care | Payer: Self-pay | Attending: Emergency Medicine | Admitting: Emergency Medicine

## 2015-07-14 ENCOUNTER — Encounter (HOSPITAL_COMMUNITY): Payer: Self-pay | Admitting: *Deleted

## 2015-07-14 DIAGNOSIS — G43809 Other migraine, not intractable, without status migrainosus: Secondary | ICD-10-CM | POA: Insufficient documentation

## 2015-07-14 DIAGNOSIS — Z79891 Long term (current) use of opiate analgesic: Secondary | ICD-10-CM | POA: Insufficient documentation

## 2015-07-14 DIAGNOSIS — Z791 Long term (current) use of non-steroidal anti-inflammatories (NSAID): Secondary | ICD-10-CM | POA: Insufficient documentation

## 2015-07-14 DIAGNOSIS — F17210 Nicotine dependence, cigarettes, uncomplicated: Secondary | ICD-10-CM | POA: Insufficient documentation

## 2015-07-14 DIAGNOSIS — I159 Secondary hypertension, unspecified: Secondary | ICD-10-CM | POA: Insufficient documentation

## 2015-07-14 LAB — CBC WITH DIFFERENTIAL/PLATELET
BASOS PCT: 0 %
Basophils Absolute: 0 10*3/uL (ref 0.0–0.1)
Eosinophils Absolute: 0 10*3/uL (ref 0.0–0.7)
Eosinophils Relative: 1 %
HEMATOCRIT: 37.2 % (ref 36.0–46.0)
Hemoglobin: 12.6 g/dL (ref 12.0–15.0)
Lymphocytes Relative: 43 %
Lymphs Abs: 2.9 10*3/uL (ref 0.7–4.0)
MCH: 28.2 pg (ref 26.0–34.0)
MCHC: 33.9 g/dL (ref 30.0–36.0)
MCV: 83.2 fL (ref 78.0–100.0)
MONO ABS: 0.3 10*3/uL (ref 0.1–1.0)
MONOS PCT: 4 %
NEUTROS ABS: 3.5 10*3/uL (ref 1.7–7.7)
Neutrophils Relative %: 52 %
Platelets: 326 10*3/uL (ref 150–400)
RBC: 4.47 MIL/uL (ref 3.87–5.11)
RDW: 13.2 % (ref 11.5–15.5)
WBC: 6.7 10*3/uL (ref 4.0–10.5)

## 2015-07-14 LAB — BASIC METABOLIC PANEL
Anion gap: 5 (ref 5–15)
BUN: 7 mg/dL (ref 6–20)
CALCIUM: 8.9 mg/dL (ref 8.9–10.3)
CO2: 27 mmol/L (ref 22–32)
CREATININE: 0.63 mg/dL (ref 0.44–1.00)
Chloride: 104 mmol/L (ref 101–111)
GFR calc non Af Amer: >60 mL/min (ref >60)
GLUCOSE: 94 mg/dL (ref 65–99)
Potassium: 3.4 mmol/L — ABNORMAL LOW (ref 3.5–5.1)
Sodium: 136 mmol/L (ref 135–145)

## 2015-07-14 LAB — URINE MICROSCOPIC-ADD ON

## 2015-07-14 LAB — URINALYSIS, ROUTINE W REFLEX MICROSCOPIC
BILIRUBIN URINE: NEGATIVE
GLUCOSE, UA: NEGATIVE mg/dL
HGB URINE DIPSTICK: NEGATIVE
KETONES UR: NEGATIVE mg/dL
Leukocytes, UA: NEGATIVE
NITRITE: POSITIVE — AB
PH: 8 (ref 5.0–8.0)
Protein, ur: NEGATIVE mg/dL
Specific Gravity, Urine: 1.008 (ref 1.005–1.030)

## 2015-07-14 MED ORDER — PROCHLORPERAZINE EDISYLATE 5 MG/ML IJ SOLN
10.0000 mg | Freq: Once | INTRAMUSCULAR | Status: AC
  Administered 2015-07-14: 10 mg via INTRAVENOUS
  Filled 2015-07-14: qty 2

## 2015-07-14 MED ORDER — LISINOPRIL 10 MG PO TABS
10.0000 mg | ORAL_TABLET | Freq: Once | ORAL | Status: AC
  Administered 2015-07-14: 10 mg via ORAL
  Filled 2015-07-14: qty 1

## 2015-07-14 MED ORDER — LISINOPRIL-HYDROCHLOROTHIAZIDE 10-12.5 MG PO TABS
1.0000 | ORAL_TABLET | Freq: Every day | ORAL | Status: DC
Start: 2015-07-14 — End: 2015-08-07

## 2015-07-14 MED ORDER — HYDROCHLOROTHIAZIDE 12.5 MG PO CAPS
12.5000 mg | ORAL_CAPSULE | Freq: Once | ORAL | Status: AC
  Administered 2015-07-14: 12.5 mg via ORAL
  Filled 2015-07-14: qty 1

## 2015-07-14 MED ORDER — SODIUM CHLORIDE 0.9 % IV BOLUS (SEPSIS)
1000.0000 mL | Freq: Once | INTRAVENOUS | Status: AC
  Administered 2015-07-14: 1000 mL via INTRAVENOUS

## 2015-07-14 MED ORDER — KETOROLAC TROMETHAMINE 30 MG/ML IJ SOLN
30.0000 mg | Freq: Once | INTRAMUSCULAR | Status: AC
  Administered 2015-07-14: 30 mg via INTRAVENOUS
  Filled 2015-07-14: qty 1

## 2015-07-14 MED ORDER — DIPHENHYDRAMINE HCL 50 MG/ML IJ SOLN
25.0000 mg | Freq: Once | INTRAMUSCULAR | Status: AC
  Administered 2015-07-14: 25 mg via INTRAVENOUS
  Filled 2015-07-14: qty 1

## 2015-07-14 NOTE — Progress Notes (Addendum)
Cm noted Cm consult for establish PCP for follow up and HTN management CM spoke with pt who [place]cp.  CM discussed and provided written information to assist pt with determining choice for uninsured accepting pcps, discussed the importance of pcp vs EDP services for f/u care, www.needymeds.org, www.goodrx.com, discounted pharmacies and other [place] resources such as [facility] , [facility], affordable care act, financial assistance, uninsured dental services, [place] med assist, DSS and  health department  Reviewed resources for [place] uninsured accepting pcps like [facility], family medicine at [address], [facility], [facility], local urgent care centers, [facility], [facility], general medical clinics, [facility] services of the [place], [facility] plus others, medication resources, [facility] out patient pharmacies and housing Pt voiced understanding and appreciation of resources provided  Pt noted to have difficulty keeping her focus or eyes open during Cm interaction with her  ED PA/NP noted to come and leave out of pt room during Cm interaction with pt  Cm made aware that after 5:30 pm there is not an available [facility] CM to assist with possible appt It is her responsibility to call [facility] to f/u Pt voiced she understood Encourage pt not to neglect contacting a provider and discussed how important ED PA/NP felt for her to be compliant with f/u care Pt voiced understanding  Provided [facility] contact information Pt agreed to a referral Cm completed referral Pt may not be eligible related to having medicaid family planning  Pt to be contact by [facility] clinical liason  Entered in d/c instructions  please use the resources offered to you by the ED case manager to find a doctor for follow up care   A referral for you has been sent to [facility] if you have not received a call in 3 days you may  contact them Call Scherry RanKaren Andrianos at [phone] Tuesday-Friday [email or web address] These [place] uninsured resources provide possible primary care providers, resources for discounted medications, housing, dental resources, affordable care act information, plus other resources for [facility] [place]   please use the resourcres provided to you in the ED   Your medicaid family planning will not cover family doctor/pcp services This will be an out of pocket cost for you If immediate follow up services is needed Please call one of the doctors listed on the first two pages Please speak with the billing offices of your referral specialists for possible payment plans    Also sent email to [facility] CM[facility] x 2 to check for possible openings This pt was originally listed in EPIC with Dr Concepcion ElkAvbuere as her pcp but pt did not confirm she was seeing avbuere

## 2015-07-14 NOTE — ED Provider Notes (Signed)
CSN: [id number]     Arrival date & time 07/14/15  1449 History   First MD Initiated Contact with Patient 07/14/15 1521     Chief Complaint  Patient presents with  . Migraine     (Consider location/radiation/quality/duration/timing/severity/associated sxs/prior Treatment) HPI   Patient is a 45 year old female past medical history of hypertension, anemia and migraines who presents the ED with complaint of headache, onset 3 AM. Patient reports she woke up this morning with gradual onset of constant throbbing right-sided headache that radiates behind her right eye and down the right side of her neck. She reports after initially waking up she had associated nausea and one episode of NBNB vomiting. She notes throughout the morning her headache gradually worsen. Patient reports right-sided rhinorrhea, visual disturbances (blurred vision of right eye, seeing colored spots), photophobia, phonophobia. She notes while she was standing at work this afternoon she began to feel lightheaded, she notes she sat down which resolved the lightheadedness. Patient reports having a history of migraines. She notes she typically has right-sided migraines however she notes that this pain is different and is worse. She notes she took a Percocet earlier this morning, naproxen later in the morning and Advil PM around noon without relief. She also notes she has not been taking her blood pressure medications for the past 2 months. She notes she recently found out she no longer has Medicaid and has been unable to afford her medications. Pt also endorses having urinary frequency for the past few days. Pt denies fever, neck stiffness, SOB, cough, CP, abdominal pain, N/V, urinary symptoms, numbness, tingling, weakness, seizures, syncope.    Past Medical History  Diagnosis Date  . Hypertension   . Migraine headache   . Lung nodule   . Tobacco use   . Anemia    Past Surgical History  Procedure Laterality Date  . Abdominal  hysterectomy    . C section x 4    . Foot surgery     Family History  Problem Relation Age of Onset  . Breast cancer Mother   . Hypertension Mother   . Hyperlipidemia Mother   . CAD Mother    Social History  Substance Use Topics  . Smoking status: Current Every Day Smoker -- 0.50 packs/day for 19 years    Types: Cigarettes  . Smokeless tobacco: None  . Alcohol Use: No   OB History    No data available     Review of Systems  HENT: Positive for rhinorrhea and sinus pressure (right sided).   Eyes: Positive for photophobia, pain (right) and visual disturbance. Negative for discharge and redness.  Genitourinary: Positive for frequency.  Neurological: Positive for light-headedness and headaches.  All other systems reviewed and are negative.     Allergies  Ciprofloxacin; Morphine and related; and Penicillins  Home Medications   Prior to Admission medications   Medication Sig Start Date End Date Taking? Authorizing Provider  Ibuprofen-Diphenhydramine Cit (ADVIL PM PO) Take 2 tablets by mouth once.   Yes Historical Provider, MD  naproxen (NAPROSYN) 250 MG tablet Take 500 mg by mouth daily as needed for moderate pain.   Yes Historical Provider, MD  Oxycodone-Acetaminophen (PERCOCET PO) Take 1 tablet by mouth once.   Yes Historical Provider, MD  albuterol (PROVENTIL HFA;VENTOLIN HFA) 108 (90 BASE) MCG/ACT inhaler Inhale 2 puffs into the lungs every 4 (four) hours as needed for wheezing or shortness of breath. Patient not taking: Reported on 10/10/2014 01/01/14   Harle BattiestElizabeth Tysinger, NP  lisinopril-hydrochlorothiazide (ZESTORETIC) 10-12.5 MG tablet Take 1 tablet by mouth daily. 07/14/15   Barrett Henle, PA-C  methocarbamol (ROBAXIN) 500 MG tablet Take 1 tablet (500 mg total) by mouth every 8 (eight) hours as needed for muscle spasms. Patient not taking: Reported on 07/14/2015 10/10/14   Raeford Razor, MD   BP 160/112 mmHg  Pulse 85  Temp(Src) 98.5 F (36.9 C) (Oral)  Resp  18  Ht 5' (1.524 m)  Wt 61.236 kg  BMI 26.37 kg/m2  SpO2 100% Physical Exam  Constitutional: She is oriented to person, place, and time. She appears well-developed and well-nourished. No distress.  HENT:  Head: Normocephalic and atraumatic.  Right Ear: Tympanic membrane normal.  Left Ear: Tympanic membrane normal.  Nose: No rhinorrhea. Right sinus exhibits maxillary sinus tenderness and frontal sinus tenderness. Left sinus exhibits no maxillary sinus tenderness and no frontal sinus tenderness.  Mouth/Throat: Uvula is midline, oropharynx is clear and moist and mucous membranes are normal. No oropharyngeal exudate, posterior oropharyngeal edema, posterior oropharyngeal erythema or tonsillar abscesses.  Eyes: Conjunctivae and EOM are normal. Pupils are equal, round, and reactive to light. Right eye exhibits no discharge. Left eye exhibits no discharge. No scleral icterus.  No nystagmus  Neck: Normal range of motion. Neck supple.  Cardiovascular: Normal rate, regular rhythm, normal heart sounds and intact distal pulses.   Pulmonary/Chest: Effort normal and breath sounds normal. No respiratory distress. She has no wheezes. She has no rales. She exhibits no tenderness.  Abdominal: Soft. Bowel sounds are normal. She exhibits no distension and no mass. There is no tenderness. There is no rebound and no guarding.  Musculoskeletal: Normal range of motion. She exhibits no edema or tenderness.  No midline C, T, or L tenderness. Full range of motion of neck and back. Full range of motion of bilateral upper and lower extremities, with 5/5 strength. Sensation intact. 2+ radial and PT pulses. Cap refill <2 seconds. Patient able to stand and ambulate without assistance.    Lymphadenopathy:    She has no cervical adenopathy.  Neurological: She is alert and oriented to person, place, and time. She has normal strength. No cranial nerve deficit or sensory deficit. Coordination normal.  Skin: Skin is warm and dry.  She is not diaphoretic.  Nursing note and vitals reviewed.   ED Course  Procedures (including critical care time) Labs Review Labs Reviewed  BASIC METABOLIC PANEL - Abnormal; Notable for the following:    Potassium 3.4 (*)    All other components within normal limits  URINALYSIS, ROUTINE W REFLEX MICROSCOPIC (NOT AT [facility]) - Abnormal; Notable for the following:    Nitrite POSITIVE (*)    All other components within normal limits  URINE MICROSCOPIC-ADD ON - Abnormal; Notable for the following:    Squamous Epithelial / LPF 0-5 (*)    Bacteria, UA MANY (*)    All other components within normal limits  CBC WITH DIFFERENTIAL/PLATELET    Imaging Review Ct Head Wo Contrast  07/14/2015  CLINICAL DATA:  Right-sided headache. EXAM: CT HEAD WITHOUT CONTRAST TECHNIQUE: Contiguous axial images were obtained from the base of the skull through the vertex without intravenous contrast. COMPARISON:  None. FINDINGS: Sharply defined triangular shaped hypodensity in the right cerebellum measuring 0.9 by 0.6 cm on image 8/2. Otherwise, the brainstem, cerebellum, cerebral peduncles, thalami, basal ganglia, basilar cisterns, and ventricular system appear within normal limits. No intracranial hemorrhage, mass lesion, or acute CVA. IMPRESSION: 1. No acute intracranial findings are identified. 2. Sharply  defined 6 by 9 mm right cerebellar hypodense lesion on image 8/2, probably a chronic lacunar infarct, correlate with patient symptoms in determining whether further workup by brain MRI is necessary. Electronically Signed   By: Gaylyn Rong M.D.   On: 07/14/2015 16:37   I have personally reviewed and evaluated these images and lab results as part of my medical decision-making.   EKG Interpretation None      MDM   Final diagnoses:  Other migraine without status migrainosus, not intractable  Secondary hypertension, unspecified    Pt presents with right sided HA which she notes is different in quality  than her typical migraines. Denies fever. BP 172/110, pt reports she has not been taking her BP meds for the past 2 months due to financial issues. Remaining vitals stable. Exam revealed right frontal and maxillary sinus tenderness, remaining exam unremarkable. No neuro deficits. Pt given IVF, a dose of her home BP meds and migraine cocktail. K 3.4, which appears to be at pts baseline. Remaining labs and urine unremarkable. CT head revealed 6x87mm right cerebellar hypodense lesion, probably chronic lacunar infarct, recommend MRI for further workup.  On reevaluation pt reports her HA has significantly improved, pt sitting up in bed watching tv without any signs of distress. Repeat vitals, BP 160/112. Pt able to stable and ambulate without ataxia noted. Discussed results and plan with pt. Case management spoke with pt regarding establishing outpatient primary care follow up for BP management and resources for filling prescriptions. Advised pt to follow up with PCP regarding BP management and follow up outpatient MRI for cerebellar hypodense lesion on CT head. Discussed strict return precautions with pt.     Satira Sark [place], [place] 07/15/15 1133  Mancel Bale, MD 07/15/15 1225  Mancel Bale, MD 07/15/15 1226

## 2015-07-14 NOTE — ED Provider Notes (Signed)
  Face-to-face evaluation   History: She denies a headache which started today associated with vomiting, and a Foley. Patient states pain is better after treatment, in ED. She denies preceding symptoms, illness yesterday, or other concerns.  Physical exam: Alert, calm, cooperative. No dysarthria and aphasia or nystagmus. She is lucid. She appears comfortable.  Medical screening examination/treatment/procedure(s) were conducted as a shared visit with non-physician practitioner(s) and myself.  I personally evaluated the patient during the encounter  Mancel BaleElliott Fantasy Donald, MD 07/15/15 1226

## 2015-07-14 NOTE — ED Notes (Signed)
MD at bedside. 

## 2015-07-14 NOTE — Discharge Instructions (Signed)
Continue taking her blood pressure medication as prescribed. I recommended eating foods high in potassium such as bananas to keep your potassium level at a normal range while taking the blood pressure medication.  I recommend following up with a primary care provider listed in the resource guide provided below or from the list given to you in the ED within the next week to have your blood pressure and potassium recheck. I also recommend following up outpatient regarding your abnormal findings on your head CT. Please return to the Emergency Department if symptoms worsen or new onset of fever, neck stiffness, visual changes, photophobia, abdominal pain, vomiting, urinary symptoms, numbness, tingling, weakness, seizures, syncope, chest pain, shortness of breath.

## 2015-07-14 NOTE — ED Notes (Signed)
Patient transported to CT 

## 2015-07-14 NOTE — ED Notes (Signed)
Pt reports R side h/a and behind her R eye.  Reports she was at work today and became dizzy and nearly had a syncopal episode.  Pt's BP is extremely high, reports she has not been taking her med for 2 months now because she cannot afford.

## 2015-08-06 ENCOUNTER — Encounter (HOSPITAL_COMMUNITY): Payer: Self-pay | Admitting: Neurology

## 2015-08-06 ENCOUNTER — Emergency Department (HOSPITAL_COMMUNITY): Payer: Self-pay

## 2015-08-06 ENCOUNTER — Observation Stay (HOSPITAL_COMMUNITY)
Admission: EM | Admit: 2015-08-06 | Discharge: 2015-08-07 | Disposition: A | Discharge status: Home / Self Care | Payer: Self-pay | Attending: Family Medicine | Admitting: Family Medicine

## 2015-08-06 DIAGNOSIS — R51 Headache: Secondary | ICD-10-CM

## 2015-08-06 DIAGNOSIS — Z880 Allergy status to penicillin: Secondary | ICD-10-CM | POA: Insufficient documentation

## 2015-08-06 DIAGNOSIS — Z885 Allergy status to narcotic agent status: Secondary | ICD-10-CM | POA: Insufficient documentation

## 2015-08-06 DIAGNOSIS — I674 Hypertensive encephalopathy: Secondary | ICD-10-CM

## 2015-08-06 DIAGNOSIS — R42 Dizziness and giddiness: Secondary | ICD-10-CM | POA: Insufficient documentation

## 2015-08-06 DIAGNOSIS — H539 Unspecified visual disturbance: Secondary | ICD-10-CM

## 2015-08-06 DIAGNOSIS — H538 Other visual disturbances: Secondary | ICD-10-CM | POA: Insufficient documentation

## 2015-08-06 DIAGNOSIS — R918 Other nonspecific abnormal finding of lung field: Secondary | ICD-10-CM | POA: Insufficient documentation

## 2015-08-06 DIAGNOSIS — F17210 Nicotine dependence, cigarettes, uncomplicated: Secondary | ICD-10-CM | POA: Insufficient documentation

## 2015-08-06 DIAGNOSIS — I10 Essential (primary) hypertension: Principal | ICD-10-CM | POA: Insufficient documentation

## 2015-08-06 DIAGNOSIS — G43109 Migraine with aura, not intractable, without status migrainosus: Secondary | ICD-10-CM | POA: Insufficient documentation

## 2015-08-06 DIAGNOSIS — I639 Cerebral infarction, unspecified: Secondary | ICD-10-CM

## 2015-08-06 DIAGNOSIS — R519 Headache, unspecified: Secondary | ICD-10-CM | POA: Insufficient documentation

## 2015-08-06 DIAGNOSIS — Z881 Allergy status to other antibiotic agents status: Secondary | ICD-10-CM | POA: Insufficient documentation

## 2015-08-06 DIAGNOSIS — Z8673 Personal history of transient ischemic attack (TIA), and cerebral infarction without residual deficits: Secondary | ICD-10-CM | POA: Insufficient documentation

## 2015-08-06 DIAGNOSIS — I159 Secondary hypertension, unspecified: Secondary | ICD-10-CM | POA: Insufficient documentation

## 2015-08-06 LAB — COMPREHENSIVE METABOLIC PANEL
ALBUMIN: 4.3 g/dL (ref 3.5–5.0)
ALK PHOS: 49 U/L (ref 38–126)
ALT: 14 U/L (ref 14–54)
ANION GAP: 8 (ref 5–15)
AST: 22 U/L (ref 15–41)
BILIRUBIN TOTAL: 0.9 mg/dL (ref 0.3–1.2)
BUN: 5 mg/dL — ABNORMAL LOW (ref 6–20)
CALCIUM: 9.6 mg/dL (ref 8.9–10.3)
CO2: 28 mmol/L (ref 22–32)
CREATININE: 0.71 mg/dL (ref 0.44–1.00)
Chloride: 99 mmol/L — ABNORMAL LOW (ref 101–111)
GFR calc Af Amer: >60 mL/min (ref >60)
GFR calc non Af Amer: >60 mL/min (ref >60)
GLUCOSE: 137 mg/dL — AB (ref 65–99)
Potassium: 3.5 mmol/L (ref 3.5–5.1)
SODIUM: 135 mmol/L (ref 135–145)
TOTAL PROTEIN: 7 g/dL (ref 6.5–8.1)

## 2015-08-06 LAB — PROTIME-INR
INR: 1.07 (ref 0.00–1.49)
Prothrombin Time: 14.1 seconds (ref 11.6–15.2)

## 2015-08-06 LAB — CBC
HCT: 40 % (ref 36.0–46.0)
HEMATOCRIT: 35.6 % — AB (ref 36.0–46.0)
HEMOGLOBIN: 13.1 g/dL (ref 12.0–15.0)
Hemoglobin: 11.8 g/dL — ABNORMAL LOW (ref 12.0–15.0)
MCH: 27.9 pg (ref 26.0–34.0)
MCH: 28.2 pg (ref 26.0–34.0)
MCHC: 32.8 g/dL (ref 30.0–36.0)
MCHC: 33.1 g/dL (ref 30.0–36.0)
MCV: 85.3 fL (ref 78.0–100.0)
MCV: 85 fL (ref 78.0–100.0)
PLATELETS: 359 10*3/uL (ref 150–400)
PLATELETS: 333 10*3/uL (ref 150–400)
RBC: 4.69 MIL/uL (ref 3.87–5.11)
RBC: 4.19 MIL/uL (ref 3.87–5.11)
RDW: 12.8 % (ref 11.5–15.5)
RDW: 12.9 % (ref 11.5–15.5)
WBC: 7.4 10*3/uL (ref 4.0–10.5)
WBC: 6.7 10*3/uL (ref 4.0–10.5)

## 2015-08-06 LAB — LIPID PANEL
CHOL/HDL RATIO: 3.6 ratio
Cholesterol: 149 mg/dL (ref 0–200)
HDL: 41 mg/dL (ref >40)
LDL CALC: 98 mg/dL (ref 0–99)
Triglycerides: 52 mg/dL (ref <150)
VLDL: 10 mg/dL (ref 0–40)

## 2015-08-06 LAB — I-STAT CHEM 8, ED
BUN: 6 mg/dL (ref 6–20)
CALCIUM ION: 1.13 mmol/L (ref 1.12–1.23)
CHLORIDE: 96 mmol/L — AB (ref 101–111)
CREATININE: 0.7 mg/dL (ref 0.44–1.00)
GLUCOSE: 134 mg/dL — AB (ref 65–99)
HCT: 44 % (ref 36.0–46.0)
Hemoglobin: 15 g/dL (ref 12.0–15.0)
Potassium: 3.5 mmol/L (ref 3.5–5.1)
Sodium: 136 mmol/L (ref 135–145)
TCO2: 26 mmol/L (ref 0–100)

## 2015-08-06 LAB — I-STAT TROPONIN, ED: Troponin i, poc: 0 ng/mL (ref 0.00–0.08)

## 2015-08-06 LAB — APTT: aPTT: 35 seconds (ref 24–37)

## 2015-08-06 LAB — DIFFERENTIAL
Basophils Absolute: 0 10*3/uL (ref 0.0–0.1)
Basophils Relative: 0 %
EOS PCT: 1 %
Eosinophils Absolute: 0.1 10*3/uL (ref 0.0–0.7)
LYMPHS ABS: 3 10*3/uL (ref 0.7–4.0)
LYMPHS PCT: 41 %
MONO ABS: 0.4 10*3/uL (ref 0.1–1.0)
Monocytes Relative: 5 %
NEUTROS ABS: 4 10*3/uL (ref 1.7–7.7)
Neutrophils Relative %: 53 %

## 2015-08-06 LAB — CREATININE, SERUM: Creatinine, Ser: 0.69 mg/dL (ref 0.44–1.00)

## 2015-08-06 LAB — TSH: TSH: 0.806 u[IU]/mL (ref 0.350–4.500)

## 2015-08-06 MED ORDER — LISINOPRIL 10 MG PO TABS
10.0000 mg | ORAL_TABLET | Freq: Every day | ORAL | Status: DC
  Administered 2015-08-07: 10 mg via ORAL
  Filled 2015-08-06 (×2): qty 1

## 2015-08-06 MED ORDER — KETOROLAC TROMETHAMINE 30 MG/ML IJ SOLN
30.0000 mg | Freq: Once | INTRAMUSCULAR | Status: AC
  Administered 2015-08-06: 30 mg via INTRAVENOUS
  Filled 2015-08-06: qty 1

## 2015-08-06 MED ORDER — PROCHLORPERAZINE EDISYLATE 5 MG/ML IJ SOLN
10.0000 mg | Freq: Once | INTRAMUSCULAR | Status: AC
  Administered 2015-08-06: 10 mg via INTRAVENOUS
  Filled 2015-08-06: qty 2

## 2015-08-06 MED ORDER — SODIUM CHLORIDE 0.9 % IV BOLUS (SEPSIS)
1000.0000 mL | Freq: Once | INTRAVENOUS | Status: AC
  Administered 2015-08-06: 1000 mL via INTRAVENOUS

## 2015-08-06 MED ORDER — ENOXAPARIN SODIUM 40 MG/0.4ML ~~LOC~~ SOLN
40.0000 mg | SUBCUTANEOUS | Status: DC
  Administered 2015-08-06: 40 mg via SUBCUTANEOUS
  Filled 2015-08-06: qty 0.4

## 2015-08-06 MED ORDER — DIPHENHYDRAMINE HCL 50 MG/ML IJ SOLN
25.0000 mg | Freq: Once | INTRAMUSCULAR | Status: AC
  Administered 2015-08-06: 25 mg via INTRAVENOUS
  Filled 2015-08-06: qty 1

## 2015-08-06 MED ORDER — HYDROCHLOROTHIAZIDE 12.5 MG PO CAPS
12.5000 mg | ORAL_CAPSULE | Freq: Every day | ORAL | Status: DC
  Administered 2015-08-07: 12.5 mg via ORAL
  Filled 2015-08-06 (×2): qty 1

## 2015-08-06 MED ORDER — ACETAMINOPHEN 325 MG PO TABS: 650.0000 mg | ORAL_TABLET | Freq: Four times a day (QID) | ORAL | Status: DC | PRN

## 2015-08-06 NOTE — ED Provider Notes (Signed)
CSN: [id number]     Arrival date & time 08/06/15  1216 History   First MD Initiated Contact with Patient 08/06/15 1237     Chief Complaint  Patient presents with  . Code Stroke    @ (Consider location/radiation/quality/duration/timing/severity/associated sxs/prior Treatment) HPI Comments: 45 year old female with history of hypertension, possible migraine headaches presents for dizziness, episode of loss of vision, headache. The patient reports that she was at work at [employer] he started to feel unwell. 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 She said she had to sit down and rub her eyes and then eventually her vision did come back. She said that she now has a severe headache. 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 When they came over they took her blood pressure and found it to be on 180s over 120s. They asked her to go to the emergency department or an urgent care for evaluation. The patient was seen in triage and a code stroke was activated by staff from triage.   Past Medical History  Diagnosis Date  . Hypertension   . Migraine headache   . Lung nodule   . Tobacco use   . Anemia    Past Surgical History  Procedure Laterality Date  . Abdominal hysterectomy    . C section x 4    . Foot surgery     Family History  Problem Relation Age of Onset  . Breast cancer Mother   . Hypertension Mother   . Hyperlipidemia Mother   . CAD Mother    Social History  Substance Use Topics  . Smoking status: Current Every Day Smoker -- 0.50 packs/day for 19 years    Types: Cigarettes  . Smokeless tobacco: None  . Alcohol Use: No   OB History    No data available     Review of Systems  Constitutional: Negative for fever, chills and fatigue.  HENT: Negative for congestion, postnasal drip and rhinorrhea.   Eyes:  Negative for visual disturbance.  Respiratory: Negative for cough, chest tightness and shortness of breath.   Cardiovascular: Negative for chest pain and palpitations.  Gastrointestinal: Negative for vomiting, abdominal pain, diarrhea and constipation.  Genitourinary: Negative for dysuria, urgency and hematuria.  Musculoskeletal: Negative for myalgias and back pain.  Skin: Negative for rash.  Neurological: Positive for dizziness and headaches. Negative for seizures, weakness, light-headedness and numbness.  Hematological: Does not bruise/bleed easily.      Allergies  Ciprofloxacin; Morphine and related; and Penicillins  Home Medications   Prior to Admission medications   Medication Sig Start Date End Date Taking? Authorizing Provider  lisinopril-hydrochlorothiazide (ZESTORETIC) 10-12.5 MG tablet Take 1 tablet by mouth daily. 07/14/15  Yes Barrett Henle, PA-C  albuterol (PROVENTIL HFA;VENTOLIN HFA) 108 (90 BASE) MCG/ACT inhaler Inhale 2 puffs into the lungs every 4 (four) hours as needed for wheezing or shortness of breath. Patient not taking: Reported on 10/10/2014 01/01/14   Harle Battiest, NP  methocarbamol (ROBAXIN) 500 MG tablet Take 1 tablet (500 mg total) by mouth every 8 (eight) hours as needed for muscle spasms. Patient not taking: Reported on 07/14/2015 10/10/14   Raeford Razor, MD   BP 119/86 mmHg  Pulse 84  Temp(Src) 98.2 F (36.8 C) (Oral)  Resp 13  Ht 5' (1.524 m)  Wt 130 lb (58.968  kg)  BMI 25.39 kg/m2  SpO2 100% Physical Exam  Constitutional: She is oriented to person, place, and time. She appears well-developed and well-nourished. No distress.  HENT:  Head: Normocephalic and atraumatic.  Right Ear: External ear normal.  Left Ear: External ear normal.  Nose: Nose normal.  Mouth/Throat: Oropharynx is clear and moist. No oropharyngeal exudate.  Eyes: EOM are normal. Pupils are equal, round, and reactive to light.  Neck: Normal range of motion. Neck  supple.  Cardiovascular: Normal rate, regular rhythm, normal heart sounds and intact distal pulses.   No murmur heard. Pulmonary/Chest: Effort normal. No respiratory distress. She has no wheezes. She has no rales.  Abdominal: Soft. She exhibits no distension. There is no tenderness.  Musculoskeletal: Normal range of motion. She exhibits no edema or tenderness.  Neurological: She is alert and oriented to person, place, and time. She has normal strength. She is not disoriented. No cranial nerve deficit or sensory deficit. She displays a negative Romberg sign. Coordination normal.  NIHSS 0 at time of my evaluation  Skin: Skin is warm and dry. No rash noted. She is not diaphoretic.  Vitals reviewed.   ED Course  Procedures (including critical care time) Labs Review Labs Reviewed  COMPREHENSIVE METABOLIC PANEL - Abnormal; Notable for the following:    Chloride 99 (*)    Glucose, Bld 137 (*)    BUN 5 (*)    All other components within normal limits  I-STAT CHEM 8, ED - Abnormal; Notable for the following:    Chloride 96 (*)    Glucose, Bld 134 (*)    All other components within normal limits  PROTIME-INR  APTT  CBC  DIFFERENTIAL  I-STAT TROPOININ, ED  CBG MONITORING, ED    Imaging Review Ct Head Wo Contrast  08/06/2015  CLINICAL DATA:  Frontal headache with dizziness and blurred vision for 1 day EXAM: CT HEAD WITHOUT CONTRAST TECHNIQUE: Contiguous axial images were obtained from the base of the skull through the vertex without intravenous contrast. COMPARISON:  Jul 14, 2015 FINDINGS: The ventricles are normal in size and configuration. There is no intracranial mass hemorrhage, extra-axial fluid collection, or midline shift. There is a prior focal lacunar infarct in the periphery of the mid right cerebellum along the posterior aspect. Elsewhere throughout compartments appear normal. No acute infarct is evident. The bony calvarium appears intact. The mastoid air cells are clear. Visualized  orbits appear symmetric bilaterally. The middle cerebral artery region attenuation is symmetric bilaterally. 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 There is no new gray-white compartment lesion. No acute infarct. No hemorrhage or mass effect. Critical Value/emergent results were called by telephone at the time of interpretation on 08/06/2015 at 1:01 pm to Felicie Morn, PA, who verbally acknowledged these results. Electronically Signed   By: Bretta Bang III M.D.   On: 08/06/2015 13:02   I have personally reviewed and evaluated these images and lab results as part of my medical decision-making.   EKG Interpretation   Date/Time:  Thursday August 06 2015 12:59:31 EDT Ventricular Rate:  104 PR Interval:  165 QRS Duration: 74 QT Interval:  354 QTC Calculation: 466 R Axis:   78 Text Interpretation:  Sinus tachycardia Anteroseptal infarct, old Baseline  wander in lead(s) II V2 Rate increased from previous Confirmed by Garyson Stelly,  Lulani Bour ([id number]) on 08/06/2015 4:20:39 PM      MDM  Patient was seen and evaluated in stable condition. Patient was called out  as a code stroke from triage. At the time of my evaluation she had already gone for head CT and was already seen by neurology.  CT without acute finding. 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 Patient's symptoms resolved after treatment with migraine cocktail. Her neurologic examination remained stable.  Re-discussed case with neurology who recommended that the patient be admitted and observed overnight. They said they were concern for possible press syndrome and felt that her blood pressure needed to be better controlled. Case was discussed with the resident on for the internal medicine service who agreed with admission. Patient was admitted under the care of Dr. Georgeann OppenheimMcDiarmod. Patient was updated on all results  and plan of care. Final diagnoses:  Headache, unspecified headache type  Secondary hypertension, unspecified  Vision changes    1. Headache  2. Vision changes  3. HTN    Leta BaptistEmily Roe Lyon Dumont, MD 08/06/15 [phone]

## 2015-08-06 NOTE — Consult Note (Signed)
Requesting Physician: Dr. Cyndie Chime    Chief Complaint: Code stroke  HPI:                                                                                                                                         Rachel Fischer is an 45 y.o. female female with known HTN and migraine HA. She awoke this AM and felt normal 0800 but then had a gradual bilateral retro-orbital pain with throbbing pain. She also noted blurred vision and dizziness with her HA. This is unusual for her typical migraine. She states she was on Elavil and Topamax for Px migraine Tx in past but this was stopped due to no insurance.  While she was at work an EMT stated she looked unhealthy and took her BP. It was 180/120 and she was recommended to go to ED. Currently her BP is 148/98 and she continues to have HA and intermittent blurred vision.   Date last known well: Today Time last known well: Time: 08:00 tPA Given: Not administered due to low NIHSS of 1    Past Medical History  Diagnosis Date  . Hypertension   . Migraine headache   . Lung nodule   . Tobacco use   . Anemia     Past Surgical History  Procedure Laterality Date  . Abdominal hysterectomy    . C section x 4    . Foot surgery      Family History  Problem Relation Age of Onset  . Breast cancer Mother   . Hypertension Mother   . Hyperlipidemia Mother   . CAD Mother    Social History:  reports that she has been smoking Cigarettes.  She has a 9.5 pack-year smoking history. She does not have any smokeless tobacco history on file. She reports that she uses illicit drugs (Marijuana). She reports that she does not drink alcohol.  Allergies:  Allergies  Allergen Reactions  . Ciprofloxacin Other (See Comments)    Generalized muscle and joint pain.  Marland Kitchen Morphine And Related Itching and Nausea And Vomiting  . Penicillins Hives    Has patient had a PCN reaction causing immediate rash, facial/tongue/throat swelling, SOB or lightheadedness with hypotension:  No Has patient had a PCN reaction causing severe rash involving mucus membranes or skin necrosis: No Has patient had a PCN reaction that required hospitalization No Has patient had a PCN reaction occurring within the last 10 years: No If all of the above answers are "NO", then may proceed with Cephalosporin use.     Medications:  No current facility-administered medications for this encounter.   Current Outpatient Prescriptions  Medication Sig Dispense Refill  . albuterol (PROVENTIL HFA;VENTOLIN HFA) 108 (90 BASE) MCG/ACT inhaler Inhale 2 puffs into the lungs every 4 (four) hours as needed for wheezing or shortness of breath. (Patient not taking: Reported on 10/10/2014) 1 Inhaler 3  . Ibuprofen-Diphenhydramine Cit (ADVIL PM PO) Take 2 tablets by mouth once.    Marland Kitchen lisinopril-hydrochlorothiazide (ZESTORETIC) 10-12.5 MG tablet Take 1 tablet by mouth daily. 30 tablet 0  . methocarbamol (ROBAXIN) 500 MG tablet Take 1 tablet (500 mg total) by mouth every 8 (eight) hours as needed for muscle spasms. (Patient not taking: Reported on 07/14/2015) 10 tablet 0  . naproxen (NAPROSYN) 250 MG tablet Take 500 mg by mouth daily as needed for moderate pain.    . Oxycodone-Acetaminophen (PERCOCET PO) Take 1 tablet by mouth once.    . [DISCONTINUED] dicyclomine (BENTYL) 20 MG tablet Take 1 tablet (20 mg total) by mouth 2 (two) times daily. (Patient not taking: Reported on 04/29/2014) 20 tablet 0  . [DISCONTINUED] olmesartan-hydrochlorothiazide (BENICAR HCT) 20-12.5 MG per tablet Take 1 tablet by mouth daily.         ROS:                                                                                                                                       History obtained from the patient  General ROS: negative for - chills, fatigue, fever, night sweats, weight gain or weight loss Psychological  ROS: negative for - behavioral disorder, hallucinations, memory difficulties, mood swings or suicidal ideation Ophthalmic ROS: negative for - blurry vision, double vision, eye pain or loss of vision ENT ROS: negative for - epistaxis, nasal discharge, oral lesions, sore throat, tinnitus or vertigo Allergy and Immunology ROS: negative for - hives or itchy/watery eyes Hematological and Lymphatic ROS: negative for - bleeding problems, bruising or swollen lymph nodes Endocrine ROS: negative for - galactorrhea, hair pattern changes, polydipsia/polyuria or temperature intolerance Respiratory ROS: negative for - cough, hemoptysis, shortness of breath or wheezing Cardiovascular ROS: negative for - chest pain, dyspnea on exertion, edema or irregular heartbeat Gastrointestinal ROS: negative for - abdominal pain, diarrhea, hematemesis, nausea/vomiting or stool incontinence Genito-Urinary ROS: negative for - dysuria, hematuria, incontinence or urinary frequency/urgency Musculoskeletal ROS: negative for - joint swelling or muscular weakness Neurological ROS: as noted in HPI Dermatological ROS: negative for rash and skin lesion changes  Neurologic Examination:  Blood pressure 141/82, pulse 103, temperature 98.2 F (36.8 C), temperature source Oral, resp. rate 16, height 5' (1.524 m), weight 58.968 kg (130 lb), SpO2 100 %.  HEENT-  Normocephalic, no lesions, without obvious abnormality.  Normal external eye and conjunctiva.  Normal TM's bilaterally.  Normal auditory canals and external ears. Normal external nose, mucus membranes and septum.  Normal pharynx. Cardiovascular- S1, S2 normal, pulses palpable throughout   Lungs- chest clear, no wheezing, rales, normal symmetric air entry Abdomen- normal findings: bowel sounds normal Extremities- no edema Lymph-no adenopathy palpable Musculoskeletal-no joint  tenderness, deformity or swelling Skin-warm and dry, no hyperpigmentation, vitiligo, or suspicious lesions  Neurological Examination Mental Status: Alert, oriented, thought content appropriate.  Speech fluent without evidence of aphasia.  Able to follow 3 step commands without difficulty. Cranial Nerves: II: Limited fundoscopic exam reveals no retinal hemorrhage OU; right optic disc visualized, with no papilledema seen; visual fields grossly normal, pupils equal, round, reactive to light and accommodation. III,IV, VI: ptosis not present, extra-ocular motions intact bilaterally V,VII: smile symmetric, facial light touch sensation normal bilaterally VIII: hearing normal bilaterally IX,X: uvula rises symmetrically XI: bilateral shoulder shrug XII: midline tongue extension Motor: Right : Upper extremity   5/5    Left:     Upper extremity   5/5  Lower extremity   5/5     Lower extremity   5/5 Tone and bulk:normal tone throughout; no atrophy noted Sensory: Pinprick and light touch intact throughout, bilaterally Deep Tendon Reflexes: 2+ and symmetric throughout Plantars: Right: downgoing   Left: downgoing Cerebellar: Normal finger-to-nose,  and normal heel-to-shin test Gait: Deferred   Lab Results: Basic Metabolic Panel:  Recent Labs Lab 08/06/15 1248  NA 136  K 3.5  CL 96*  GLUCOSE 134*  BUN 6  CREATININE 0.70    Liver Function Tests: No results for input(s): AST, ALT, ALKPHOS, BILITOT, PROT, ALBUMIN in the last 168 hours. No results for input(s): LIPASE, AMYLASE in the last 168 hours. No results for input(s): AMMONIA in the last 168 hours.  CBC:  Recent Labs Lab 08/06/15 1238 08/06/15 1248  WBC 7.4  --   NEUTROABS 4.0  --   HGB 13.1 15.0  HCT 40.0 44.0  MCV 85.3  --   PLT 359  --     Cardiac Enzymes: No results for input(s): CKTOTAL, CKMB, CKMBINDEX, TROPONINI in the last 168 hours.  Lipid Panel: No results for input(s): CHOL, TRIG, HDL, CHOLHDL, VLDL, LDLCALC  in the last 409 hours.  CBG: No results for input(s): GLUCAP in the last 168 hours.  Microbiology: Results for orders placed or performed during the hospital encounter of 10/10/14  Urine culture     Status: None   Collection Time: 10/10/14  9:26 AM  Result Value Ref Range Status   Specimen Description URINE, CLEAN CATCH  Final   Special Requests NONE  Final   Culture >=100,000 COLONIES/mL ESCHERICHIA COLI  Final   Report Status 10/12/2014 FINAL  Final   Organism ID, Bacteria ESCHERICHIA COLI  Final      Susceptibility   Escherichia coli - MIC*    AMPICILLIN <=2 SENSITIVE Sensitive     CEFAZOLIN <=4 SENSITIVE Sensitive     CEFTRIAXONE <=1 SENSITIVE Sensitive     CIPROFLOXACIN <=0.25 SENSITIVE Sensitive     GENTAMICIN <=1 SENSITIVE Sensitive     IMIPENEM <=0.25 SENSITIVE Sensitive     NITROFURANTOIN <=16 SENSITIVE Sensitive     TRIMETH/SULFA <=20 SENSITIVE Sensitive  AMPICILLIN/SULBACTAM <=2 SENSITIVE Sensitive     PIP/TAZO <=4 SENSITIVE Sensitive     * >=100,000 COLONIES/mL ESCHERICHIA COLI    Coagulation Studies:  Recent Labs  08/06/15 1238  LABPROT 14.1  INR 1.07    Imaging: Ct Head Wo Contrast  08/06/2015  CLINICAL DATA:  Frontal headache with dizziness and blurred vision for 1 day EXAM: CT HEAD WITHOUT CONTRAST TECHNIQUE: Contiguous axial images were obtained from the base of the skull through the vertex without intravenous contrast. COMPARISON:  Jul 14, 2015 FINDINGS: The ventricles are normal in size and configuration. There is no intracranial mass hemorrhage, extra-axial fluid collection, or midline shift. There is a prior focal lacunar infarct in the periphery of the mid right cerebellum along the posterior aspect. Elsewhere throughout compartments appear normal. No acute infarct is evident. The bony calvarium appears intact. The mastoid air cells are clear. Visualized orbits appear symmetric bilaterally. The middle cerebral artery region attenuation is symmetric  bilaterally. 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 There is no new gray-white compartment lesion. No acute infarct. No hemorrhage or mass effect. Critical Value/emergent results were called by telephone at the time of interpretation on 08/06/2015 at 1:01 pm to Felicie Mornavid Smith, PA, who verbally acknowledged these results. Electronically Signed   By: Bretta BangWilliam  Woodruff III M.D.   On: 08/06/2015 13:02   History and examination documented by Felicie Mornavid Smith PA-C, [facility], [phone]  Assessment: 1.  45 y.o. female with HA and blurred vision. Likely secondary to her elevated BP, with hypertensive encephalopathy or PRES being the most likely etiologies for her headache and vision loss. Given her history of CVA and elevated BP we would like to obtain an MRI of her brain, but are unable due to hardware in hair (pipe cleaners in her braids).  2. 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 No new findings are appreciated.  3. Stroke Risk Factors - hypertension  Recommendations: 1. BP management.  2. Observation.  3. Headache management.   Electronically signed: Dr. Caryl PinaEric Charvez [place] 08/06/2015, 1:10 PM

## 2015-08-06 NOTE — ED Notes (Signed)
Neurologist at bedside. 

## 2015-08-06 NOTE — Progress Notes (Signed)
Admitting MD paged that new patient is here.

## 2015-08-06 NOTE — Progress Notes (Signed)
45 year old female presents to [facility] triage w/complaints headache and dizziness. Code stroke called at triage at 1233. Stroke team met patient in CT. Pt reports this am around 0800 developed headache behind bilateral eyes and some dizziness. She was at work continued to work dizziness became worse. FD checked patient found elevated BP pt reports 182/124 and recommended patient to report to ED. Pt has hx of migraines but states"this feels different from normal migraine." Was on Topermax but off x2 years. Pt now reports dizziness, pain to back of neck bilaterally, and behind both eyes, hx of HTN. NIHSS 1 drift Left leg, pt reports pain in Left hip. BP came down to 140/82 on Lisinopril at home. Denies missing dose. Dr. Cheral Marker at bedside. No acute treatment at this time. Hand off to [employer].

## 2015-08-06 NOTE — H&P (Signed)
[facility] History and Physical Service Pager: [phone]  Patient name: Rachel PigeonDalina J Cleaves Medical record number: [id number] Date of birth: 30-Jan-1971 Age: 45 y.o. Gender: female  Primary Care Provider: No primary care provider on file. Consultants: Fischer Code Status: Full  Chief Complaint: Hypertensive emergency  Assessment and Plan: Rachel PigeonDalina J Bordonaro is a 45 y.o. female presenting with reported prior to presentation hypertension and continued headache with blurred vision . 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 Differential includes PRES, hypertensive encephalopathy. Blood pressure in the ED was largely normal with a max of 141/82. Her headache and vision changes resolved with compazine, benadryl and toradol. CT head negative for acute process but did show a stable chronic lacunar infarct. MRI unable to be performed due to hardware in hair ( pipe cleaners in her braids) - admit to telemetry Dr McDiarmid attending - continue home lisinorpil, HCTZ - hydralazine PRN severe HTN - if unable to control HTN will look for secondary causes of HTN - Given CT head findings and HTN will obtain risk strat labs- A1c, Lipid panel, TSH  Neuro- Significant headache with dizziness in the setting of elevated blood pressure concerning for acute neurologic insult as above. She does have a history of migraine headache so potentially her headache during this episode represents complex migraine - neuro checks q2 - tylenol PRN headache  Pulm nodule- History of stable 3 mm Pulmonary nodule first seen on CT chest 2011 ( noted in CT chest 2013 read) and stable on 2013 and 2015 chest imaging. Initially concerning given significant smoking history and 4.5 mm ground glass nodule on 2011 and 2013 CT chest. Not commented on in 2015 CT chest - Consider CT chest during this  admission  Social- She has no health insurance and has voiced concern over affording health care and medications. She does not have a PCP so will need help establishing care - Case management and SW  Tobacco use- Smokes 1/4 ppd, previously 1.5 ppd - nicotiene patch offered but patient refused   FEN/GI: Regular diet, SLIV Prophylaxis: Lovenox  Disposition: Admit to family medicice teaching service  History of Present Illness:  Rachel PigeonDalina J Dickmann is a 45 y.o. female presenting with reported hypertension to 180/120 with headache, dizziness  At approximately 8 AM this morning, while she was at work at [employer], she started to have headache behind her eyes. She then became dizzy and had blurry vision. She sat down and drank cool water, but this did not help. She denies having weakness, slurred speech, nausea/emesis or flashing lights in her vision. Her job is next to a fire station, so a friend took her there for evaluation and here BP was found to be 180/120. They offered to call an ambulance but she did not want to incur the cost so a friend drove her to the ED. Here she was given medicine for her headache which resolved it and her blurry vision resolved. Her blood pressure was within normal limits in the ED and she did not require medications for this acutely  She denies chest pain, SOB, weakness, current dizziness   She reports a history of hypertension and was previously managed by internal medicine for this, but lost her medicaid several years ago. She was seen in the ED on 5/23 for migraine headache and hypertension to 172/110 and after stabilization was discharged with Lisinopril-HCTZ. She reports that she  has been taking her blood pressure medication as prescribed and took it this AM  Review Of Systems: Per HPI with the following additions: recent illness N/V/D Otherwise the remainder of the systems were negative.  Patient Active Problem List   Diagnosis Date Noted  . Headache 08/06/2015   . Pulmonary nodule 06/07/2011    Past Medical History: Past Medical History  Diagnosis Date  . Hypertension   . Migraine headache   . Lung nodule   . Tobacco use   . Anemia     Past Surgical History: Past Surgical History  Procedure Laterality Date  . Abdominal hysterectomy    . C section x 4    . Foot surgery      Social History: Social History  Substance Use Topics  . Smoking status: Current Every Day Smoker -- 0.50 packs/day for 19 years    Types: Cigarettes  . Smokeless tobacco: Never Used  . Alcohol Use: No   Additional social history: smokes 1/4 ppd, denies etoh use, occasionally uses marijuana otherwise no drug use Please also refer to relevant sections of EMR.  Family History: Family History  Problem Relation Age of Onset  . Breast cancer Mother   . Hypertension Mother   . Hyperlipidemia Mother   . CAD Mother     Allergies and Medications:  No current facility-administered medications on file prior to encounter.   Current Outpatient Prescriptions on File Prior to Encounter  Medication Sig Dispense Refill  . lisinopril-hydrochlorothiazide (ZESTORETIC) 10-12.5 MG tablet Take 1 tablet by mouth daily. 30 tablet 0  . albuterol (PROVENTIL HFA;VENTOLIN HFA) 108 (90 BASE) MCG/ACT inhaler Inhale 2 puffs into the lungs every 4 (four) hours as needed for wheezing or shortness of breath. (Patient not taking: Reported on 10/10/2014) 1 Inhaler 3  . methocarbamol (ROBAXIN) 500 MG tablet Take 1 tablet (500 mg total) by mouth every 8 (eight) hours as needed for muscle spasms. (Patient not taking: Reported on 07/14/2015) 10 tablet 0  . [DISCONTINUED] dicyclomine (BENTYL) 20 MG tablet Take 1 tablet (20 mg total) by mouth 2 (two) times daily. (Patient not taking: Reported on 04/29/2014) 20 tablet 0  . [DISCONTINUED] olmesartan-hydrochlorothiazide (BENICAR HCT) 20-12.5 MG per tablet Take 1 tablet by mouth daily.        Objective: BP 130/85 mmHg  Pulse 74  Temp(Src) 98.1 F  (36.7 C) (Oral)  Resp 18  Ht 5' (1.524 m)  Wt 127 lb 3.2 oz (57.698 kg)  BMI 24.84 kg/m2  SpO2 100% Exam: General: NAD, lying in bed HEENT: PERRL, EOMI Cardiovascular: RRR, no murmurs auscultated Respiratory: CTAB, normal WOB Abdomen: soft, non tender, non distended, + BS MSK: normal ROM Skin: no rashes or lesion noted Psych: normal mood and affect Neuro: Cranial Nerves II - XII - II - Visual field intact  III, IV, VI - Extraocular movements intact. V - Facial sensation intact bilaterally. VII - Facial movement intact bilaterally. VIII - Hearing & vestibular intact bilaterally. X - Palate elevates symmetrically, no dysarthria. XI - Chin turning & shoulder shrug intact bilaterally. XII - Tongue protrusion intact.  Motor Strength - The patient's strength was 5/5 in all extremities Bulk was normal and fasciculations were absent.    Labs and Imaging: CBC BMET   Recent Labs Lab 08/06/15 1238 08/06/15 1248  WBC 7.4  --   HGB 13.1 15.0  HCT 40.0 44.0  PLT 359  --     Recent Labs Lab 08/06/15 1238 08/06/15 1248  NA 135 136  K 3.5 3.5  CL 99* 96*  CO2 28  --   BUN 5* 6  CREATININE 0.71 0.70  GLUCOSE 137* 134*  CALCIUM 9.6  --      CT head: Prior lacunar infarct in the periphery of the mid right cerebellum, stable. There is no new gray-white compartment lesion. No acute infarct. No hemorrhage or mass effect.   Bonney Aid, MD 08/06/2015, 6:13 PM PGY-2, [facility] FPTS Intern pager: [phone], text pages welcome

## 2015-08-06 NOTE — ED Notes (Signed)
[facility] called @ [phone].

## 2015-08-06 NOTE — ED Notes (Signed)
Pt reports this morning while at work at 0800 developed dizziness, blurry vision, and h/a.  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 Now reports h/a and feels off balance. Pt is a x 4. Speech is clear, no facial droop, confirms blurry vision, dizziness .

## 2015-08-07 DIAGNOSIS — H538 Other visual disturbances: Secondary | ICD-10-CM | POA: Insufficient documentation

## 2015-08-07 DIAGNOSIS — G4489 Other headache syndrome: Secondary | ICD-10-CM

## 2015-08-07 DIAGNOSIS — R51 Headache: Secondary | ICD-10-CM

## 2015-08-07 DIAGNOSIS — H539 Unspecified visual disturbance: Secondary | ICD-10-CM | POA: Insufficient documentation

## 2015-08-07 DIAGNOSIS — I159 Secondary hypertension, unspecified: Secondary | ICD-10-CM | POA: Insufficient documentation

## 2015-08-07 DIAGNOSIS — R519 Headache, unspecified: Secondary | ICD-10-CM | POA: Insufficient documentation

## 2015-08-07 LAB — CBC
HCT: 36.8 % (ref 36.0–46.0)
Hemoglobin: 11.9 g/dL — ABNORMAL LOW (ref 12.0–15.0)
MCH: 27.3 pg (ref 26.0–34.0)
MCHC: 32.3 g/dL (ref 30.0–36.0)
MCV: 84.4 fL (ref 78.0–100.0)
Platelets: 332 10*3/uL (ref 150–400)
RBC: 4.36 MIL/uL (ref 3.87–5.11)
RDW: 12.6 % (ref 11.5–15.5)
WBC: 5 10*3/uL (ref 4.0–10.5)

## 2015-08-07 LAB — BASIC METABOLIC PANEL
Anion gap: 9 (ref 5–15)
BUN: 11 mg/dL (ref 6–20)
CALCIUM: 8.9 mg/dL (ref 8.9–10.3)
CO2: 26 mmol/L (ref 22–32)
Chloride: 104 mmol/L (ref 101–111)
Creatinine, Ser: 0.68 mg/dL (ref 0.44–1.00)
GFR calc Af Amer: >60 mL/min (ref >60)
GLUCOSE: 90 mg/dL (ref 65–99)
Potassium: 3.4 mmol/L — ABNORMAL LOW (ref 3.5–5.1)
Sodium: 139 mmol/L (ref 135–145)

## 2015-08-07 MED ORDER — LISINOPRIL-HYDROCHLOROTHIAZIDE 10-12.5 MG PO TABS: 1.0000 | ORAL_TABLET | Freq: Every day | ORAL | Status: DC

## 2015-08-07 NOTE — Progress Notes (Signed)
Patient refusing tele, and has been educated. Also has stated " I will leave AMA". MD paged. Will continue to monitor.

## 2015-08-07 NOTE — Discharge Summary (Signed)
Family Medicine Teaching [facility] Discharge Summary  Patient name: Rachel Fischer Medical record number: [id number] Date of birth: 1970/08/29 Age: 45 y.o. Gender: female Date of Admission: 08/06/2015  Date of Discharge: 08/07/2015 Admitting Physician: Leighton Roach McDiarmid, MD  Primary Care Provider: No primary care provider on file. Consultants: Neurology  Indication for Hospitalization: concern for hypertensive emergency  Discharge Diagnoses/Problem List:  Active Problems:   Headache   Blurred vision   HTN  Disposition: Home  Discharge Condition: Improved  Discharge Exam:  Blood pressure 124/67, pulse 64, temperature 98.9 F (37.2 C), temperature source Oral, resp. rate 16, height 5' (1.524 m), weight 127 lb 3.2 oz (57.698 kg), SpO2 100 %. General: NAD, sitting up in bed HEENT: PERRL, EOMI Cardiovascular: RRR, no m/r/g Respiratory: CTAB, normal WOB Abdomen: soft, non tender, non distended, + BS MSK: normal ROM, strength 5/5 in all extremities Neuro: Alert and oriented x 3. No focal deficits.  Psych: normal mood and affect  Brief Hospital Course:  Rachel Fischer is a 45 y.o. female who presented with reported BP of 180/120 prior to presentation (taken at nearby fire station) and continued headache with blurred vision. PMH is significant for HTN, tobacco abuse, and migraines.   HTN: BP reportedly elevated to 180/120 at a community fire station with headache and vision changes concerning for hypertensive emergency. Differential included PRES and hypertensive encephalopathy, so Neurology was consulted; overnight observation recommended. Blood pressure in the ED was normal with a max of 141/82. CT head negative for acute process but did show a stable chronic lacunar infarct. MRI unable to be performed due to hardware in hair (pipe cleaners in her braids). No events on telemetry. Patient was normotensive overnight with pressures ranging from 109/59-141/82 on home lisinopril and  HCTZ. Risk stratification labs showed TSH of 0.806, lipid panel with LDL of 98, and hgb A1c pending at time of discharge. ASCVD risk 8.2%, so would benefit from starting moderate to high intensity statin.   Headache: Patient presented with severe headache with dizziness in the setting of elevated blood pressure, concerning for acute neurologic insult as above. Suspect complex migraine given history of migraine headaches, resolution with migraine cocktail of compazine, benadryl and toradol in the ED, and CT negative for acute infarct  migraine headache so potentially her headache during this episode represents complex migraine. Neurology was consulted in the ED and recommended observing overnight. Neurology attending Dr. Otelia Limes approved discharge the following day since BP and HA controlled.   Pulm nodule History of stable 3 mm pulmonary nodule first seen on CT chest 2011 (noted in CT chest 2013 read) and stable on 2013 and 2015 chest imaging. Initially concerning given significant smoking history and 4.5 mm ground glass nodule on 2011 and 2013 CT chest. Not commented on in 2015 CT chest. Follow-up outpatient.   Social Case management and SW were consulted to help patient establish with a PCP and discuss most affordable way to get medications. Patient had been paying about $21 for BP medications, but both are available on [facility] $4 list.   Tobacco use Smokes 1/4 ppd, previously 1.5 ppd. Refused nicotine patch while hospitalized.   Issues for Follow Up:  1. Needs CT chest ordered outpatient to follow-up pulmonary nodule.  2. Inquire about patient's progress applying for Medicaid 3. Prescribe statin therapy 4. Consider prescribing migraine prevention medication like topamax, which has worked for patient in the past. 5. Continue to encourage smoking cessation  Significant Procedures: None  Significant Labs and  Imaging:   Recent Labs Lab 08/06/15 1238 08/06/15 1248 08/06/15 1955  08/07/15 0618  WBC 7.4  --  6.7 5.0  HGB 13.1 15.0 11.8* 11.9*  HCT 40.0 44.0 35.6* 36.8  PLT 359  --  333 332    Recent Labs Lab 08/06/15 1238 08/06/15 1248 08/06/15 1955 08/07/15 0618  NA 135 136  --  139  K 3.5 3.5  --  3.4*  CL 99* 96*  --  104  CO2 28  --   --  26  GLUCOSE 137* 134*  --  90  BUN 5* 6  --  11  CREATININE 0.71 0.70 0.69 0.68  CALCIUM 9.6  --   --  8.9  ALKPHOS 49  --   --   --   AST 22  --   --   --   ALT 14  --   --   --   ALBUMIN 4.3  --   --   --    Ct Head Wo Contrast  08/06/2015  IMPRESSION: Prior lacunar infarct in the periphery of the mid right cerebellum, stable. There is no new gray-white compartment lesion. No acute infarct. No hemorrhage or mass effect.   Results/Tests Pending at Time of Discharge: Hgb A1c  Discharge Medications:    Medication List    STOP taking these medications        methocarbamol 500 MG tablet  Commonly known as:  ROBAXIN      TAKE these medications        albuterol 108 (90 Base) MCG/ACT inhaler  Commonly known as:  PROVENTIL HFA;VENTOLIN HFA  Inhale 2 puffs into the lungs every 4 (four) hours as needed for wheezing or shortness of breath.     lisinopril-hydrochlorothiazide 10-12.5 MG tablet  Commonly known as:  ZESTORETIC  Take 1 tablet by mouth daily.        Discharge Instructions: Please refer to Patient Instructions section of EMR for full details.  Patient was counseled important signs and symptoms that should prompt return to medical care, changes in medications, dietary instructions, activity restrictions, and follow up appointments.   Follow-Up Appointments:     Follow-up Information    Schedule an appointment as soon as possible for a visit with [facility].   Contact information:   [address] [place] [id number] [phone]      Follow [facility] with [facility].   Contact information:   Your medications are included in the [facility] $4.00 program.       Casey BurkittHillary Moen Akiem Urieta, MD 08/07/2015, 12:18 PM PGY-1, [facility]

## 2015-08-07 NOTE — Progress Notes (Signed)
Nsg Discharge Note  Admit Date:  08/06/2015 Discharge date: 08/07/2015   Belinda Fisheralina J Sagan to be D/C'd Home per MD order.  AVS completed.  Copy for chart, and copy for patient signed, and dated. Patient/caregiver able to verbalize understanding.  Discharge Medication:   Medication List    STOP taking these medications        methocarbamol 500 MG tablet  Commonly known as:  ROBAXIN      TAKE these medications        albuterol 108 (90 Base) MCG/ACT inhaler  Commonly known as:  PROVENTIL HFA;VENTOLIN HFA  Inhale 2 puffs into the lungs every 4 (four) hours as needed for wheezing or shortness of breath.     lisinopril-hydrochlorothiazide 10-12.5 MG tablet  Commonly known as:  ZESTORETIC  Take 1 tablet by mouth daily.        Discharge Assessment: Filed Vitals:   08/06/15 2250 08/07/15 0551  BP: 112/65 124/67  Pulse: 71 64  Temp: 98.4 F (36.9 C) 98.9 F (37.2 C)  Resp: 16 16   Skin clean, dry and intact without evidence of skin break down, no evidence of skin tears noted. IV catheter discontinued intact. Site without signs and symptoms of complications - no redness or edema noted at insertion site, patient denies c/o pain - only slight tenderness at site.  Dressing with slight pressure applied.  D/c Instructions-Education: Discharge instructions given to patient/family with verbalized understanding. D/c education completed with patient/family including follow up instructions, medication list, d/c activities limitations if indicated, with other d/c instructions as indicated by MD - patient able to verbalize understanding, all questions fully answered. Patient instructed to return to ED, call 911, or call MD for any changes in condition.  Patient escorted out of hospital, and D/C home via private auto.  Kern ReapBrumagin, Garvin Ellena L, RN 08/07/2015 12:43 PM

## 2015-08-07 NOTE — Discharge Instructions (Signed)
Ms. Rachel Fischer,  You were observed overnight for blood pressures and headache. Blood pressures were stable on your home medications. Please continue these upon discharge. I have provided a refill.  It will be important in the long-term to establish with a healthcare provider and apply for medicaid to have access to monitor your blood pressure and headaches long term.  Ibuprofen can help with headaches but can cause headaches if used daily.   Thank you for letting us take part in your care.  Best, Dr. Sampson GoonFitzgerald

## 2015-08-07 NOTE — Progress Notes (Signed)
Family Medicine Teaching Service Daily Progress Note Intern Pager: [phone]  Patient name: Rachel Fischer Medical record number: [id number] Date of birth: 1970/04/20 Age: 45 y.o. Gender: female  Primary Care Provider: No primary care provider on file. Consultants: None Code Status: Full  Pt Overview and Major Events to Date:  6/15: Admitted for possible hypertensive emergency  Assessment and Plan: Rachel Fischer is a 45 y.o. female presenting with reported BP of 180/120 prior to presentation and continued headache with blurred vision. PMH is significant for HTN, tobacco abuse, and migraines.   HTN- Normotensive overnight with pressures ranging from 109/59-141/82. Reportedly 180/120 at a community fire station with headache and vision changes concerning for hypertensive emergency. Differential includes PRES, hypertensive encephalopathy. Blood pressure in the ED was largely normal with a max of 141/82. CT head negative for acute process but did show a stable chronic lacunar infarct. MRI unable to be performed due to hardware in hair ( pipe cleaners in her braids). No events on telemetry. - continue home lisinorpil, HCTZ - hydralazine PRN severe HTN (did not require) - Given CT head findings and HTN will obtain risk strat labs- A1c (pending), Lipid panel (LDL 98), TSH (0.806)  Neuro- Significant headache with dizziness in the setting of elevated blood pressure concerning for acute neurologic insult as above. She does have a history of migraine headache so potentially her headache during this episode represents complex migraine. Her headache and vision changes resolved with compazine, benadryl and toradol.  - Spoke with Neurology attending Dr. Otelia Limes who said patient was appropriate for discharge since BP and HA controlled.  - neuro checks q2 - tylenol PRN headache (did not require)  Pulm nodule- History of stable 3 mm Pulmonary nodule first seen on CT chest 2011 ( noted in CT chest 2013  read) and stable on 2013 and 2015 chest imaging. Initially concerning given significant smoking history and 4.5 mm ground glass nodule on 2011 and 2013 CT chest. Not commented on in 2015 CT chest - Will need follow-up CT chest ordered outpatient  Social- She has no health insurance and has voiced concern over affording health care and medications. She does not have a PCP so will need help establishing care - Case management and SW consulted  Tobacco use- Smokes 1/4 ppd, previously 1.5 ppd - nicotine patch offered but patient refused  FEN/GI: Regular diet, SLIV Prophylaxis: Lovenox  Disposition: Discharge home today, 6/16, pending social work meeting with patient to help arrange follow-up.   Subjective:  Patient says her headache is gone and that she is ready to go home.   Objective: Temp:  [98.1 F (36.7 C)-98.9 F (37.2 C)] 98.9 F (37.2 C) (06/16 0551) Pulse Rate:  [64-103] 64 (06/16 0551) Resp:  [13-29] 16 (06/16 0551) BP: (109-141)/(59-98) 124/67 mmHg (06/16 0551) SpO2:  [99 %-100 %] 100 % (06/16 0551) Weight:  [127 lb 3.2 oz (57.698 kg)-130 lb (58.968 kg)] 127 lb 3.2 oz (57.698 kg) (06/15 1744) Physical Exam: General: NAD, sitting up in bed HEENT: PERRL, EOMI Cardiovascular: RRR, no m/r/g Respiratory: CTAB, normal WOB Abdomen: soft, non tender, non distended, + BS MSK: normal ROM, strength 5/5 in all extremities Neuro: Alert and oriented x 3. No focal deficits.  Psych: normal mood and affect  Laboratory:  Recent Labs Lab 08/06/15 1238 08/06/15 1248 08/06/15 1955  WBC 7.4  --  6.7  HGB 13.1 15.0 11.8*  HCT 40.0 44.0 35.6*  PLT 359  --  333    Recent Labs  Lab 08/06/15 1238 08/06/15 1248 08/06/15 1955  NA 135 136  --   K 3.5 3.5  --   CL 99* 96*  --   CO2 28  --   --   BUN 5* 6  --   CREATININE 0.71 0.70 0.69  CALCIUM 9.6  --   --   PROT 7.0  --   --   BILITOT 0.9  --   --   ALKPHOS 49  --   --   ALT 14  --   --   AST 22  --   --   GLUCOSE 137*  134*  --     Imaging/Diagnostic Tests: Ct Head Wo Contrast  08/06/2015  CLINICAL DATA:  Frontal headache with dizziness and blurred vision for 1 day EXAM: CT HEAD WITHOUT CONTRAST TECHNIQUE: Contiguous axial images were obtained from the base of the skull through the vertex without intravenous contrast. COMPARISON:  Jul 14, 2015 FINDINGS: The ventricles are normal in size and configuration. There is no intracranial mass hemorrhage, extra-axial fluid collection, or midline shift. There is a prior focal lacunar infarct in the periphery of the mid right cerebellum along the posterior aspect. Elsewhere throughout compartments appear normal. No acute infarct is evident. The bony calvarium appears intact. The mastoid air cells are clear. Visualized orbits appear symmetric bilaterally. The middle cerebral artery region attenuation is symmetric bilaterally. 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 There is no new gray-white compartment lesion. No acute infarct. No hemorrhage or mass effect. Critical Value/emergent results were called by telephone at the time of interpretation on 08/06/2015 at 1:01 pm to Felicie Mornavid Smith, PA, who verbally acknowledged these results. Electronically Signed   By: Bretta BangWilliam  Woodruff III M.D.   On: 08/06/2015 13:02    Casey BurkittHillary Moen Fitzgerald, MD 08/07/2015, 6:44 AM PGY-1, [facility] FPTS Intern pager: [phone], text pages welcome

## 2015-08-08 LAB — HEMOGLOBIN A1C
HEMOGLOBIN A1C: 5.3 % (ref 4.8–5.6)
MEAN PLASMA GLUCOSE: 105 mg/dL

## 2015-09-24 ENCOUNTER — Ambulatory Visit: Payer: Medicaid Other

## 2015-09-29 ENCOUNTER — Ambulatory Visit: Payer: Medicaid Other | Attending: Internal Medicine

## 2015-10-14 ENCOUNTER — Encounter (HOSPITAL_COMMUNITY): Payer: Self-pay | Admitting: Neurology

## 2015-10-14 ENCOUNTER — Emergency Department (HOSPITAL_COMMUNITY): Payer: Medicaid Other

## 2015-10-14 ENCOUNTER — Emergency Department (HOSPITAL_COMMUNITY)
Admission: EM | Admit: 2015-10-14 | Discharge: 2015-10-14 | Disposition: A | Discharge status: Home / Self Care | Payer: Medicaid Other | Attending: Emergency Medicine | Admitting: Emergency Medicine

## 2015-10-14 DIAGNOSIS — R0789 Other chest pain: Secondary | ICD-10-CM | POA: Insufficient documentation

## 2015-10-14 DIAGNOSIS — F17210 Nicotine dependence, cigarettes, uncomplicated: Secondary | ICD-10-CM | POA: Insufficient documentation

## 2015-10-14 DIAGNOSIS — M546 Pain in thoracic spine: Secondary | ICD-10-CM | POA: Insufficient documentation

## 2015-10-14 DIAGNOSIS — I10 Essential (primary) hypertension: Secondary | ICD-10-CM | POA: Insufficient documentation

## 2015-10-14 LAB — I-STAT TROPONIN, ED: TROPONIN I, POC: 0.08 ng/mL (ref 0.00–0.08)

## 2015-10-14 LAB — BASIC METABOLIC PANEL
Anion gap: 6 (ref 5–15)
BUN: 8 mg/dL (ref 6–20)
CALCIUM: 9.4 mg/dL (ref 8.9–10.3)
CO2: 25 mmol/L (ref 22–32)
CREATININE: 0.65 mg/dL (ref 0.44–1.00)
Chloride: 104 mmol/L (ref 101–111)
Glucose, Bld: 102 mg/dL — ABNORMAL HIGH (ref 65–99)
Potassium: 3.6 mmol/L (ref 3.5–5.1)
Sodium: 135 mmol/L (ref 135–145)

## 2015-10-14 LAB — CBC
HCT: 38.6 % (ref 36.0–46.0)
Hemoglobin: 12.8 g/dL (ref 12.0–15.0)
MCH: 29.1 pg (ref 26.0–34.0)
MCHC: 33.2 g/dL (ref 30.0–36.0)
MCV: 87.7 fL (ref 78.0–100.0)
PLATELETS: 349 10*3/uL (ref 150–400)
RBC: 4.4 MIL/uL (ref 3.87–5.11)
RDW: 13.1 % (ref 11.5–15.5)
WBC: 7 10*3/uL (ref 4.0–10.5)

## 2015-10-14 MED ORDER — DIAZEPAM 5 MG PO TABS
5.0000 mg | ORAL_TABLET | Freq: Once | ORAL | Status: AC
  Administered 2015-10-14: 5 mg via ORAL
  Filled 2015-10-14: qty 1

## 2015-10-14 MED ORDER — KETOROLAC TROMETHAMINE 30 MG/ML IJ SOLN
15.0000 mg | Freq: Once | INTRAMUSCULAR | Status: AC
  Administered 2015-10-14: 15 mg via INTRAVENOUS
  Filled 2015-10-14: qty 1

## 2015-10-14 NOTE — ED Notes (Signed)
PA Hedges at bedside  

## 2015-10-14 NOTE — ED Notes (Signed)
When trying to get EKG pt was not very cooperative. Pt would not hold still. Pt stated that the doctor will understand why she is not holding still because her chest is hurting. When pt asked if she wanted to hurt anyone or herself pt stated that she wanted to hurt us because of the fact that we asked her to hold still for a EKG. Pt angry and rude in triage.

## 2015-10-14 NOTE — ED Notes (Signed)
Pt returned from x-ray, she stated that while she was there she had 1 episode of vomiting.

## 2015-10-14 NOTE — ED Notes (Signed)
PA at bedside.

## 2015-10-14 NOTE — Discharge Instructions (Signed)
Please use Tylenol and ibuprofen as needed for discomfort. Please read attached information. If you experience any new or worsening signs or symptoms please return to the emergency room for evaluation. Please follow-up with your primary care provider or specialist as discussed.

## 2015-10-14 NOTE — ED Provider Notes (Signed)
[facility]-EMERGENCY DEPT Provider Note   CSN: [id number] Arrival date & time: 10/14/15  [id number]   History   Chief Complaint Chief Complaint  Patient presents with  . Chest Pain    HPI Rachel Fischer is a 45 y.o. female.  HPI    45 year old female presents today with presents today with complaints of back pain. Patient reports she was in the shower bent over and felt a sharp pain in her right thoracic and scapular back. She reports pain radiates forward to her chest. She reports symptoms are made worse with upper extremity movements, leaning forward, and significantly worse with palpation of her back. She reports she gets short of breath when the back pain comes, denies any baseline shortness of breath. She denies any specific chest pain other than the radiation pain, denies any history of the same, denies any fever, cough, abdominal pain, lower extremity swelling or edema. No history DVT or PE, no significant risk factors.   Past Medical History:  Diagnosis Date  . Anemia   . Hypertension   . Lung nodule   . Migraine headache   . Tobacco use     Patient Active Problem List   Diagnosis Date Noted  . Blurred vision   . Cephalalgia   . Vision changes   . Secondary hypertension, unspecified   . Headache 08/06/2015  . Pulmonary nodule 06/07/2011    Past Surgical History:  Procedure Laterality Date  . ABDOMINAL HYSTERECTOMY    . c section x 4    . FOOT SURGERY      OB History    No data available       Home Medications    Prior to Admission medications   Medication Sig Start Date End Date Taking? Authorizing Provider  lisinopril-hydrochlorothiazide (ZESTORETIC) 10-12.5 MG tablet Take 1 tablet by mouth daily. 08/07/15  Yes Hillary Percell BostonMoen Fitzgerald, MD  albuterol (PROVENTIL HFA;VENTOLIN HFA) 108 (90 BASE) MCG/ACT inhaler Inhale 2 puffs into the lungs every 4 (four) hours as needed for wheezing or shortness of breath. Patient not taking: Reported on 10/10/2014 01/01/14    Harle BattiestElizabeth Tysinger, NP    Family History Family History  Problem Relation Age of Onset  . Breast cancer Mother   . Hypertension Mother   . Hyperlipidemia Mother   . CAD Mother     Social History Social History  Substance Use Topics  . Smoking status: Current Every Day Smoker    Packs/day: 0.50    Years: 19.00    Types: Cigarettes  . Smokeless tobacco: Never Used  . Alcohol use No     Allergies   Ciprofloxacin; Morphine and related; and Penicillins   Review of Systems Review of Systems  All other systems reviewed and are negative.    Physical Exam Updated Vital Signs BP 110/78   Pulse 71   Temp 98.5 F (36.9 C) (Oral)   Resp 14   SpO2 100%   Physical Exam  Constitutional: She is oriented to person, place, and time. She appears well-developed and well-nourished.  HENT:  Head: Normocephalic and atraumatic.  Eyes: Conjunctivae are normal. Pupils are equal, round, and reactive to light. Right eye exhibits no discharge. Left eye exhibits no discharge. No scleral icterus.  Neck: Normal range of motion. No JVD present. No tracheal deviation present.  Cardiovascular: Normal rate and regular rhythm.   Pulmonary/Chest: Effort normal and breath sounds normal. No stridor. No respiratory distress. She has no wheezes. She has no rales. She exhibits no tenderness.  Musculoskeletal:  Exquisite tenderness to palpation of the right lateral thoracic and scapular region. Even light palpation causes dramatic reaction. Similar tenderness to palpation to her right anterior chest wall. Patient painful with any movement  Neurological: She is alert and oriented to person, place, and time. Coordination normal.  Psychiatric: She has a normal mood and affect. Her behavior is normal. Judgment and thought content normal.  Nursing note and vitals reviewed.    ED Treatments / Results  Labs (all labs ordered are listed, but only abnormal results are displayed) Labs Reviewed  BASIC  METABOLIC PANEL - Abnormal; Notable for the following:       Result Value   Glucose, Bld 102 (*)    All other components within normal limits  CBC  I-STAT TROPOININ, ED    EKG  EKG Interpretation None       Radiology Dg Chest 2 View  Result Date: 10/14/2015 CLINICAL DATA:  Chest pain EXAM: CHEST  2 VIEW COMPARISON:  05/15/2014 FINDINGS: The heart size and mediastinal contours are within normal limits. Both lungs are clear. The visualized skeletal structures are unremarkable. IMPRESSION: No active cardiopulmonary disease. Electronically Signed   By: Kennith CenterEric  Mansell M.D.   On: 10/14/2015 09:13    Procedures Procedures (including critical care time)  Medications Ordered in ED Medications  ketorolac (TORADOL) 30 MG/ML injection 15 mg (15 mg Intravenous Given 10/14/15 0938)  diazepam (VALIUM) tablet 5 mg (5 mg Oral Given 10/14/15 [id number])     Initial Impression / Assessment and Plan / ED Course  I have reviewed the triage vital signs and the nursing notes.  Pertinent labs & imaging results that were available during my care of the patient were reviewed by me and considered in my medical decision making (see chart for details).  Clinical Course     Final Clinical Impressions(s) / ED Diagnoses   Final diagnoses:  Right-sided thoracic back pain    Labs:  Imaging:  Consults:  Therapeutics:  Discharge Meds:   Assessment/Plan: Patient presents with back pain. This is likely musculoskeletal in nature. She is exquisitely tenderness to light palpation, appears to be significantly uncomfortable, but when her phone rings she is able She has reassuring vital signs, reassuring laboratory analysis. She was discharged home with Medicare instructions and strict return precautions. She verbalized understanding and agreement to today's plan had no further questions or concerns at time of discharge to lean forward in the bed and move around in the bed without any signs of distress. Patient  given Toradol and Valium here in the ED which significantly improved her symptoms. I have very low suspicion for ACS, PE, or any significant potential life-threatening etiology in this patient.      New Prescriptions New Prescriptions   No medications on file     Eyvonne MechanicJeffrey Chyna Kneece, PA-C 10/14/15 1143    Rolland PorterMark James, MD 10/20/15 1659

## 2015-10-14 NOTE — ED Triage Notes (Signed)
Pt reports right sided cp around her breast and her shoulder blade since this morning after bending over in the shower. Pt is crying in triage. Denies n/v/d or cardiac hx.

## 2015-11-14 IMAGING — CT CT ABD-PELV W/ CM
1 of 3 series · 14 of 32 positions shown, 19 images · IV contrast (omnipaque)
Comparison: None.

CLINICAL DATA: 43-year-old female with sharp periumbilical pains
and hematochezia

EXAM:
CT ABDOMEN AND PELVIS WITH CONTRAST
TECHNIQUE: Multidetector CT imaging of the abdomen and pelvis was performed
using the standard protocol following bolus administration of
intravenous contrast.
CONTRAST:  100mL OMNIPAQUE IOHEXOL 300 MG/ML  SOLN

[Series 2: abd/pel with · axial · 0.66mm/px · z∈[+1044,+1378]mm · 14 of 75 slices shown, 19 images]
[im 4/75  soft-tissue]
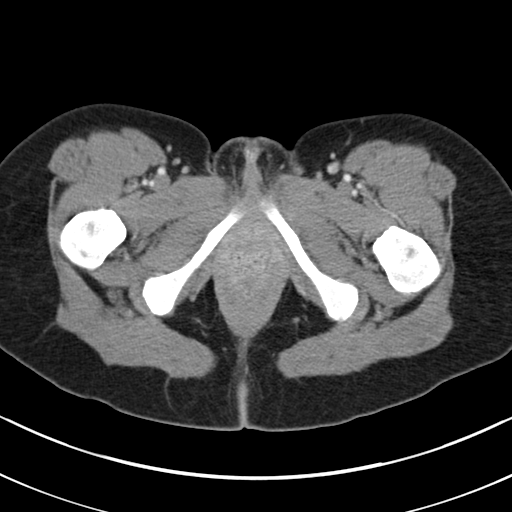
[im 4/75  bone]
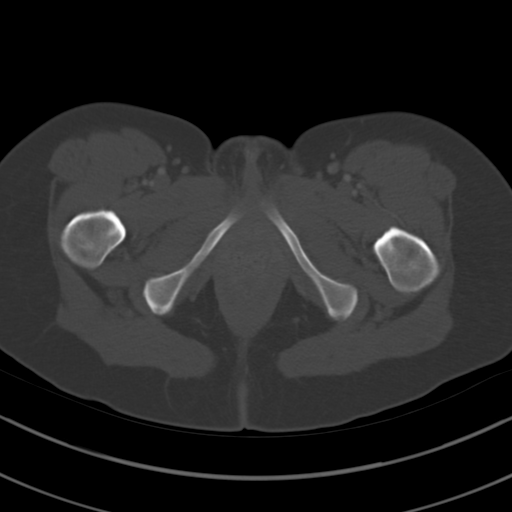
[im 12/75  soft-tissue]
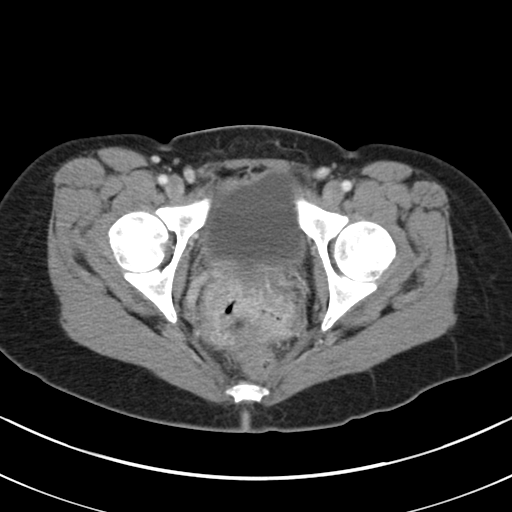
[im 16/75  soft-tissue]
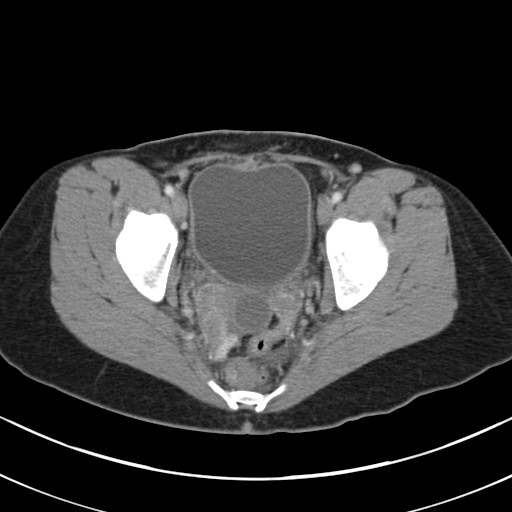
[im 20/75  soft-tissue]
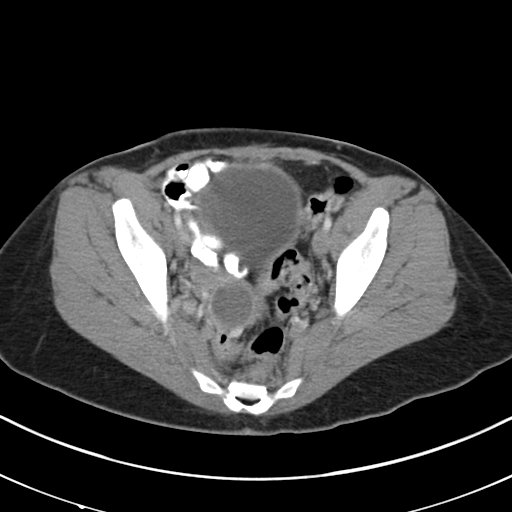
[im 28/75  soft-tissue]
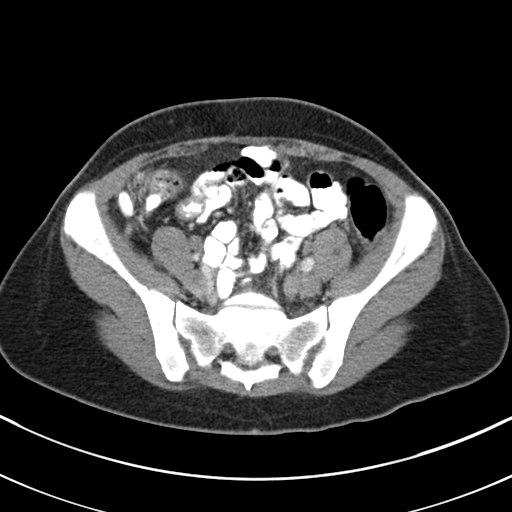
[im 32/75  soft-tissue]
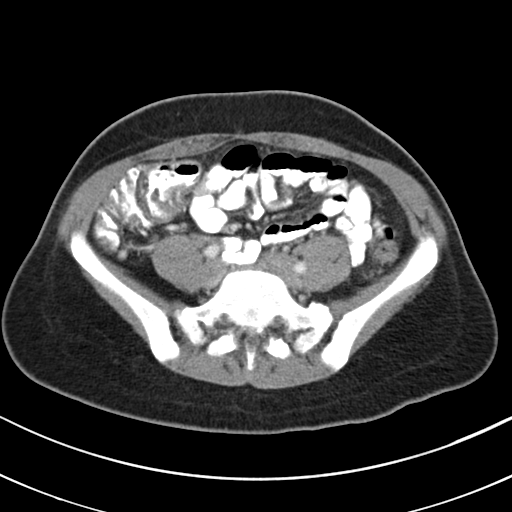
[im 39/75  soft-tissue]
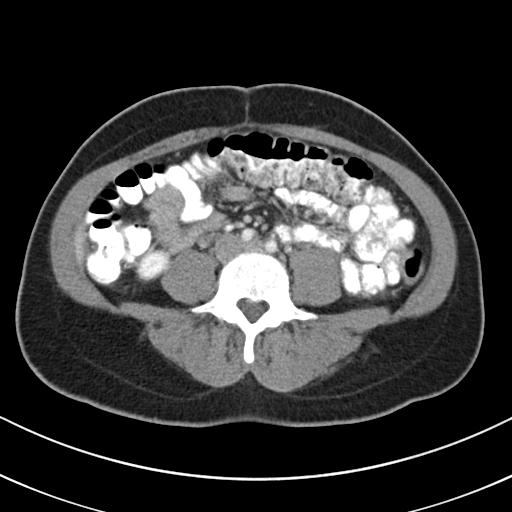
[im 43/75  soft-tissue]
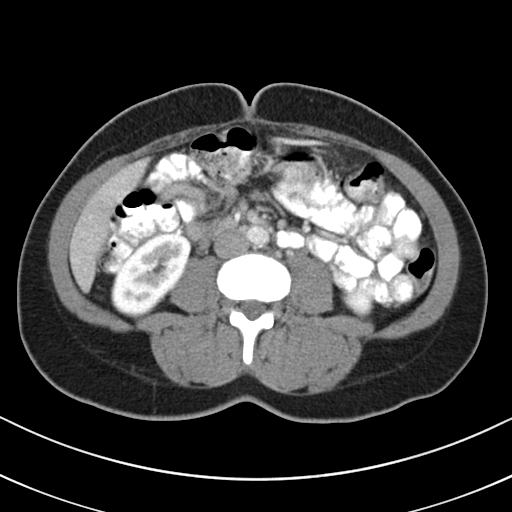
[im 47/75  soft-tissue]
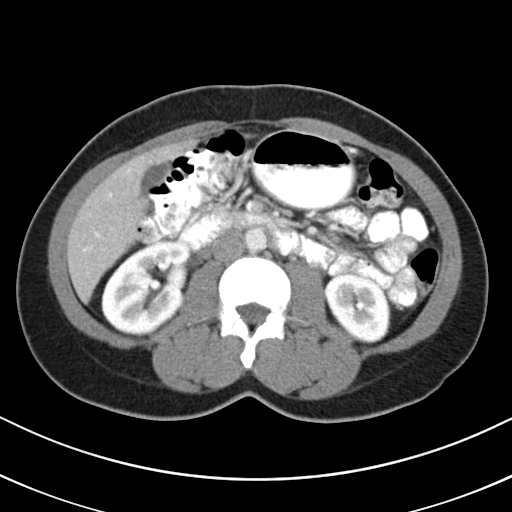
[im 47/75  bone]
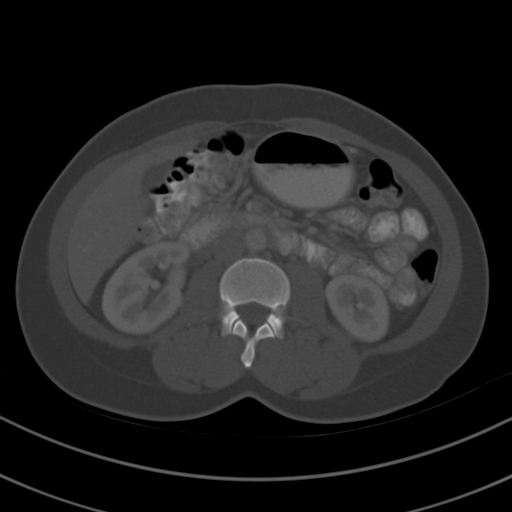
[im 55/75  soft-tissue]
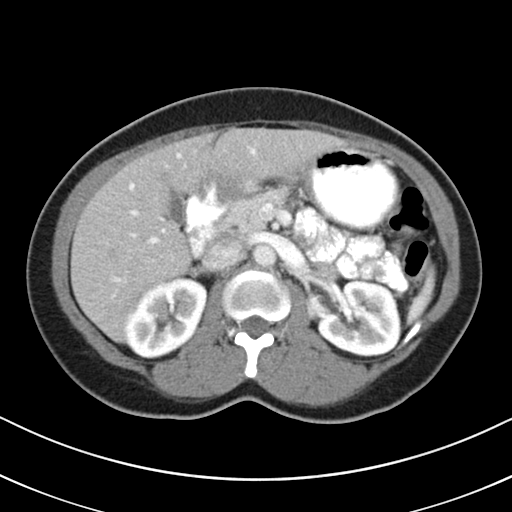
[im 59/75  soft-tissue]
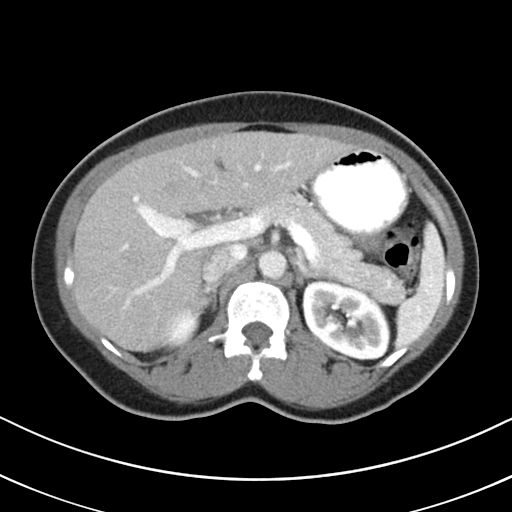
[im 59/75  lung]
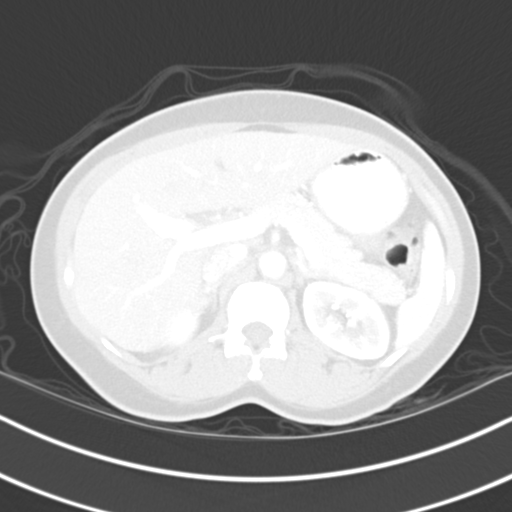
[im 63/75  soft-tissue]
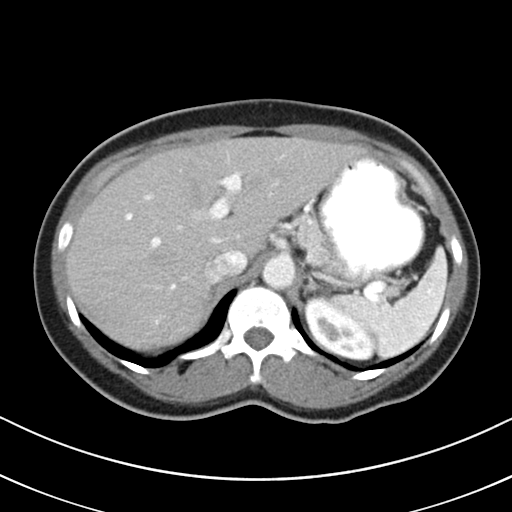
[im 63/75  lung]
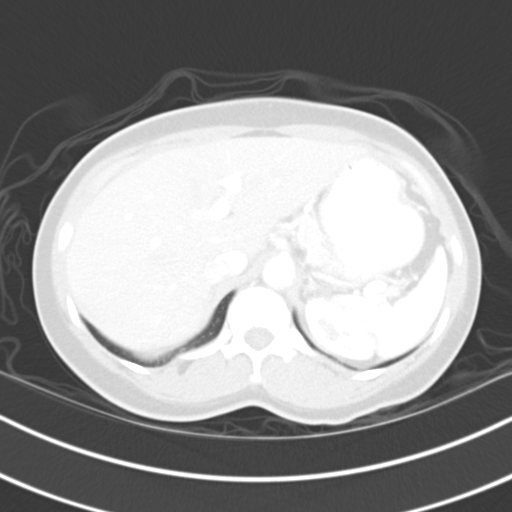
[im 67/75  lung]
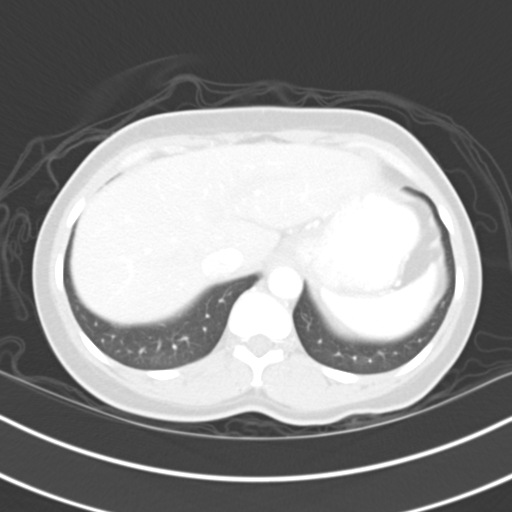
[im 71/75  soft-tissue]
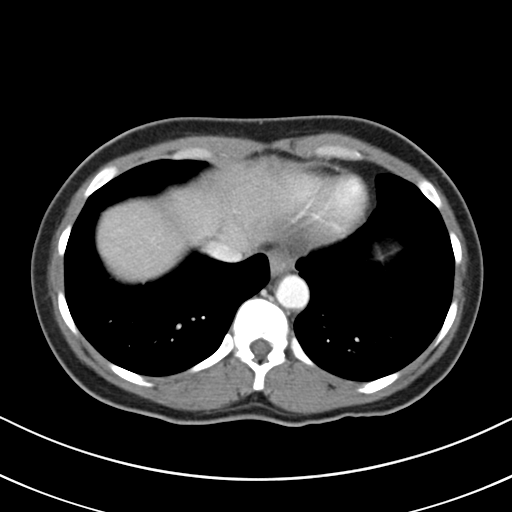
[im 71/75  lung]
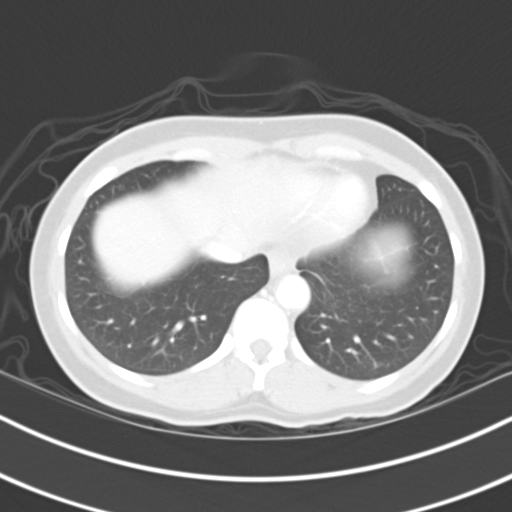

[14 of 32 positions shown; findings below may reference images not displayed]

FINDINGS: Lower Chest: The lung bases are clear. Visualized cardiac structures
are within normal limits for size. No pericardial effusion.
Unremarkable visualized distal thoracic esophagus.

Abdomen: Unremarkable CT appearance of the stomach, duodenum,
spleen, adrenal glands and pancreas. Normal hepatic contour and
morphology. No discrete hepatic lesion. Gallbladder is unremarkable.
No intra or extrahepatic biliary ductal dilatation.

Unremarkable appearance of the bilateral kidneys. No focal solid
lesion, hydronephrosis or nephrolithiasis. No evidence of bowel
obstruction or focal bowel wall thickening. Normal appendix in the
right lower quadrant.

Pelvis: Surgical changes of prior hysterectomy. 2.9 cm low
attenuation cyst in the region of the right at adnexa. There is an
adjacent 2.5 cm low-attenuation cystic structure. No free fluid or
suspicious adenopathy.

Bones/Soft Tissues: No acute fracture or aggressive appearing lytic
or blastic osseous lesion.

Vascular: Trace atherosclerotic vascular calcifications without
aneurysmal dilatation or evidence of significant stenosis. Portal,
renal and splanchnic veins remain patent.
IMPRESSION: 1. No acute abnormality in the abdomen or pelvis.
2. Two adjacent simple cysts in the right adnexa measuring 2.9 and
2.5 cm respectively likely represent dominant follicular cysts.
Findings are almost certainly benign and no specific imaging
follow-up is recommended.
3. Trace aortic atherosclerotic vascular calcifications.

## 2015-12-18 ENCOUNTER — Encounter (HOSPITAL_COMMUNITY): Payer: Self-pay | Admitting: *Deleted

## 2015-12-18 ENCOUNTER — Emergency Department (HOSPITAL_COMMUNITY)
Admission: EM | Admit: 2015-12-18 | Discharge: 2015-12-18 | Disposition: A | Discharge status: Home / Self Care | Payer: Medicaid Other | Attending: Emergency Medicine | Admitting: Emergency Medicine

## 2015-12-18 DIAGNOSIS — F17290 Nicotine dependence, other tobacco product, uncomplicated: Secondary | ICD-10-CM | POA: Insufficient documentation

## 2015-12-18 DIAGNOSIS — M25562 Pain in left knee: Secondary | ICD-10-CM | POA: Insufficient documentation

## 2015-12-18 DIAGNOSIS — I10 Essential (primary) hypertension: Secondary | ICD-10-CM | POA: Insufficient documentation

## 2015-12-18 MED ORDER — LISINOPRIL-HYDROCHLOROTHIAZIDE 10-12.5 MG PO TABS: 1.0000 | ORAL_TABLET | Freq: Every day | ORAL | 0 refills | Status: DC

## 2015-12-18 MED ORDER — NAPROXEN 500 MG PO TABS: 500.0000 mg | ORAL_TABLET | Freq: Two times a day (BID) | ORAL | 0 refills | Status: DC

## 2015-12-18 NOTE — ED Provider Notes (Signed)
[facility] DEPT Provider Note   By signing my name below, I, Earmon Phoenix, attest that this documentation has been prepared under the direction and in the presence of Keshanna Riso, PA-C. Electronically Signed: Earmon Phoenix, ED Scribe. 12/18/15. 1:46 PM.    History   Chief Complaint Chief Complaint  Patient presents with  . Knee Pain    The history is provided by the patient and medical records. No language interpreter was used.    HPI Comments:  Rachel Fischer is a 45 y.o. female who presents to the Emergency Department complaining of sharp, left knee pain that began upon waking this morning. She states once she went to work and was on her feet, the pain turned to a burning sensation and began to worsen. She has not taken anything for pain. Flexion and extension of the left knee increases the pain. She denies alleviating factors. She denies CP, SOB, fever, chills, nausea, vomiting, numbness, tingling or weakness of the LLE, bruising, wounds, redness, warmth, trauma, injury or fall. She denies personal h/o arthritis but reports family history. She denies any past injury to the left knee.   Past Medical History:  Diagnosis Date  . Anemia   . Hypertension   . Lung nodule   . Migraine headache   . Tobacco use     Patient Active Problem List   Diagnosis Date Noted  . Blurred vision   . Cephalalgia   . Vision changes   . Secondary hypertension, unspecified   . Headache 08/06/2015  . Pulmonary nodule 06/07/2011    Past Surgical History:  Procedure Laterality Date  . ABDOMINAL HYSTERECTOMY    . c section x 4    . FOOT SURGERY      OB History    No data available      Home Medications    Prior to Admission medications   Medication Sig Start Date End Date Taking? Authorizing Provider  albuterol (PROVENTIL HFA;VENTOLIN HFA) 108 (90 BASE) MCG/ACT inhaler Inhale 2 puffs into the lungs every 4 (four) hours as needed for wheezing or shortness of  breath. Patient not taking: Reported on 10/10/2014 01/01/14   Harle Battiest, NP  lisinopril-hydrochlorothiazide (ZESTORETIC) 10-12.5 MG tablet Take 1 tablet by mouth daily. 08/07/15   Hillary Percell Boston, MD    Family History Family History  Problem Relation Age of Onset  . Breast cancer Mother   . Hypertension Mother   . Hyperlipidemia Mother   . CAD Mother     Social History Social History  Substance Use Topics  . Smoking status: Current Every Day Smoker    Packs/day: 0.50    Years: 19.00    Types: Cigarettes  . Smokeless tobacco: Never Used  . Alcohol use No     Allergies   Ciprofloxacin; Morphine and related; and Penicillins   Review of Systems Review of Systems A complete 10 system review of systems was obtained and all systems are negative except as noted in the HPI and PMH.    Physical Exam Updated Vital Signs BP 151/92 (BP Location: Right Arm)   Pulse 89   Temp 97.9 F (36.6 C) (Oral)   Resp 16   Ht 5' (1.524 m)   Wt 130 lb (59 kg)   SpO2 100%   BMI 25.39 kg/m   Physical Exam  Constitutional: She is oriented to person, place, and time. She appears well-developed and well-nourished.  HENT:  Head: Normocephalic and atraumatic.  Neck: Normal range of motion.  Cardiovascular:  Normal rate, regular rhythm and normal heart sounds.  Exam reveals no gallop and no friction rub.   No murmur heard. Left DP pulses 2+  Pulmonary/Chest: Effort normal and breath sounds normal. No respiratory distress. She has no wheezes. She has no rales.  Musculoskeletal: Normal range of motion. She exhibits tenderness. She exhibits no edema or deformity.  No tenderness to palpation of left calf. No erythema, edema, or warmth of left knee. Tenderness to palpation of lateral aspect of left knee. Full ROM of left knee.  Neurological: She is alert and oriented to person, place, and time.  Distal sensations of the left foot intact.  Skin: Skin is warm and dry.  Psychiatric:  She has a normal mood and affect. Her behavior is normal.  Nursing note and vitals reviewed.    ED Treatments / Results  DIAGNOSTIC STUDIES: Oxygen Saturation is 100% on RA, normal by my interpretation.   COORDINATION OF CARE: 1:42 PM- Informed pt that imaging is not indicated at this time. Will prescribe NSAID and order knee sleeve for the left knee. Advised pt to ice the area as tolerated. Pt verbalizes understanding and agrees to plan.  Medications - No data to display   Labs (all labs ordered are listed, but only abnormal results are displayed) Labs Reviewed - No data to display  EKG  EKG Interpretation None       Radiology No results found.  Procedures Procedures (including critical care time)  Medications Ordered in ED Medications - No data to display   Initial Impression / Assessment and Plan / ED Course  I have reviewed the triage vital signs and the nursing notes.  Pertinent labs & imaging results that were available during my care of the patient were reviewed by me and considered in my medical decision making (see chart for details).  Clinical Course    Patient presents with left knee pain.  No acute injury or trauma.  No signs of infection.  Full ROM of the knee.  Ambulating without difficulty.  Explained to pt that imaging is not indicated at this time due to no trauma, injury or fall. Patient given knee sleeve while in ED, conservative therapy recommended and discussed. Patient will be discharged home & is agreeable with above plan. Returns precautions discussed. Pt appears safe for discharge.  I personally performed the services described in this documentation, which was scribed in my presence. The recorded information has been reviewed and is accurate.   Final Clinical Impressions(s) / ED Diagnoses   Final diagnoses:  None    New Prescriptions New Prescriptions   No medications on file     Santiago GladHeather Chanique Duca, PA-C 12/18/15 1659    Donnetta HutchingBrian  Cook, MD 12/20/15 [phone]

## 2015-12-18 NOTE — ED Notes (Addendum)
WOKE WITH LEFT KNEE PAIN. NO KNOWN INJURY. PT GETTING UNDRESSED. NEEDS WORK NOTE. NO DEFORMITY.

## 2015-12-18 NOTE — ED Notes (Signed)
Ortho tech in to apply knee sleeve.

## 2015-12-18 NOTE — ED Notes (Addendum)
Pt requesting Rx for BP meds. States she "can't get it anywhere else without insurance".  States has not had BP meds x 1 month.

## 2015-12-18 NOTE — ED Triage Notes (Signed)
Pt states L knee pain when she woke up.  Denies injury.  Able to ambulate.

## 2015-12-18 NOTE — Progress Notes (Signed)
Orthopedic Tech Progress Note Patient Details:  Elisabeth PigeonDalina J Collard 25-Jul-1970 [id number]  Ortho Devices Type of Ortho Device: Knee Sleeve Ortho Device/Splint Location: Lt Knee Ortho Device/Splint Interventions: Ordered, Application   Clois Dupesvery S Lamija Besse 12/18/2015, 2:58 PM

## 2015-12-18 NOTE — ED Notes (Signed)
Pt given Rx for BP meds. Instructed to follow up at [facility].

## 2016-02-28 ENCOUNTER — Emergency Department (HOSPITAL_COMMUNITY): Payer: BLUE CROSS/BLUE SHIELD

## 2016-02-28 ENCOUNTER — Encounter (HOSPITAL_COMMUNITY): Payer: Self-pay

## 2016-02-28 ENCOUNTER — Inpatient Hospital Stay (HOSPITAL_COMMUNITY): Payer: BLUE CROSS/BLUE SHIELD

## 2016-02-28 ENCOUNTER — Inpatient Hospital Stay (HOSPITAL_COMMUNITY)
Admission: EM | Admit: 2016-02-28 | Discharge: 2016-02-28 | Disposition: A | Discharge status: Home / Self Care | Payer: BLUE CROSS/BLUE SHIELD | Attending: Obstetrics & Gynecology | Admitting: Obstetrics & Gynecology

## 2016-02-28 DIAGNOSIS — D649 Anemia, unspecified: Secondary | ICD-10-CM | POA: Insufficient documentation

## 2016-02-28 DIAGNOSIS — A599 Trichomoniasis, unspecified: Secondary | ICD-10-CM

## 2016-02-28 DIAGNOSIS — I10 Essential (primary) hypertension: Secondary | ICD-10-CM | POA: Insufficient documentation

## 2016-02-28 DIAGNOSIS — R112 Nausea with vomiting, unspecified: Secondary | ICD-10-CM | POA: Diagnosis present

## 2016-02-28 DIAGNOSIS — F17210 Nicotine dependence, cigarettes, uncomplicated: Secondary | ICD-10-CM | POA: Diagnosis not present

## 2016-02-28 DIAGNOSIS — N390 Urinary tract infection, site not specified: Secondary | ICD-10-CM | POA: Diagnosis not present

## 2016-02-28 DIAGNOSIS — A5901 Trichomonal vulvovaginitis: Secondary | ICD-10-CM | POA: Insufficient documentation

## 2016-02-28 DIAGNOSIS — G43909 Migraine, unspecified, not intractable, without status migrainosus: Secondary | ICD-10-CM | POA: Insufficient documentation

## 2016-02-28 DIAGNOSIS — R1031 Right lower quadrant pain: Secondary | ICD-10-CM | POA: Insufficient documentation

## 2016-02-28 DIAGNOSIS — N83201 Unspecified ovarian cyst, right side: Secondary | ICD-10-CM | POA: Diagnosis not present

## 2016-02-28 DIAGNOSIS — N760 Acute vaginitis: Secondary | ICD-10-CM | POA: Insufficient documentation

## 2016-02-28 DIAGNOSIS — R109 Unspecified abdominal pain: Secondary | ICD-10-CM

## 2016-02-28 DIAGNOSIS — N83519 Torsion of ovary and ovarian pedicle, unspecified side: Secondary | ICD-10-CM | POA: Diagnosis present

## 2016-02-28 LAB — CBC
HEMATOCRIT: 38.9 % (ref 36.0–46.0)
HEMOGLOBIN: 12.8 g/dL (ref 12.0–15.0)
MCH: 28 pg (ref 26.0–34.0)
MCHC: 32.9 g/dL (ref 30.0–36.0)
MCV: 85.1 fL (ref 78.0–100.0)
Platelets: 342 10*3/uL (ref 150–400)
RBC: 4.57 MIL/uL (ref 3.87–5.11)
RDW: 13.3 % (ref 11.5–15.5)
WBC: 7.9 10*3/uL (ref 4.0–10.5)

## 2016-02-28 LAB — URINALYSIS, ROUTINE W REFLEX MICROSCOPIC
Bilirubin Urine: NEGATIVE
Glucose, UA: NEGATIVE mg/dL
KETONES UR: NEGATIVE mg/dL
Nitrite: POSITIVE — AB
PROTEIN: NEGATIVE mg/dL
Specific Gravity, Urine: 1.016 (ref 1.005–1.030)
pH: 7 (ref 5.0–8.0)

## 2016-02-28 LAB — COMPREHENSIVE METABOLIC PANEL
ALBUMIN: 4.2 g/dL (ref 3.5–5.0)
ALT: 12 U/L — ABNORMAL LOW (ref 14–54)
ANION GAP: 8 (ref 5–15)
AST: 18 U/L (ref 15–41)
Alkaline Phosphatase: 49 U/L (ref 38–126)
BUN: 6 mg/dL (ref 6–20)
CHLORIDE: 105 mmol/L (ref 101–111)
CO2: 24 mmol/L (ref 22–32)
Calcium: 9.6 mg/dL (ref 8.9–10.3)
Creatinine, Ser: 1 mg/dL (ref 0.44–1.00)
GFR calc Af Amer: >60 mL/min (ref >60)
GFR calc non Af Amer: >60 mL/min (ref >60)
GLUCOSE: 104 mg/dL — AB (ref 65–99)
POTASSIUM: 4.1 mmol/L (ref 3.5–5.1)
SODIUM: 137 mmol/L (ref 135–145)
Total Bilirubin: 0.5 mg/dL (ref 0.3–1.2)
Total Protein: 6.8 g/dL (ref 6.5–8.1)

## 2016-02-28 LAB — WET PREP, GENITAL
Sperm: NONE SEEN
YEAST WET PREP: NONE SEEN

## 2016-02-28 LAB — LIPASE, BLOOD: Lipase: 62 U/L — ABNORMAL HIGH (ref 11–51)

## 2016-02-28 MED ORDER — FENTANYL CITRATE (PF) 100 MCG/2ML IJ SOLN
50.0000 ug | INTRAMUSCULAR | Status: AC | PRN
Start: 2016-02-28 — End: 2016-02-28
  Administered 2016-02-28 (×3): 50 ug via INTRAVENOUS
  Filled 2016-02-28 (×3): qty 2

## 2016-02-28 MED ORDER — ONDANSETRON 4 MG PO TBDP
ORAL_TABLET | ORAL | Status: AC
  Administered 2016-02-28: 4 mg
  Filled 2016-02-28: qty 1

## 2016-02-28 MED ORDER — LISINOPRIL-HYDROCHLOROTHIAZIDE 10-12.5 MG PO TABS: 1.0000 | ORAL_TABLET | Freq: Every day | ORAL | 0 refills | Status: DC

## 2016-02-28 MED ORDER — HYDROCODONE-ACETAMINOPHEN 5-325 MG PO TABS: 1.0000 | ORAL_TABLET | ORAL | 0 refills | Status: DC | PRN

## 2016-02-28 MED ORDER — IOPAMIDOL (ISOVUE-300) INJECTION 61%
INTRAVENOUS | Status: AC
  Administered 2016-02-28: 100 mL
  Filled 2016-02-28: qty 100

## 2016-02-28 MED ORDER — FLUCONAZOLE 150 MG PO TABS: 150.0000 mg | ORAL_TABLET | Freq: Every day | ORAL | 0 refills | Status: DC

## 2016-02-28 MED ORDER — KETOROLAC TROMETHAMINE 60 MG/2ML IM SOLN
60.0000 mg | Freq: Once | INTRAMUSCULAR | Status: AC
  Administered 2016-02-28: 60 mg via INTRAMUSCULAR
  Filled 2016-02-28: qty 2

## 2016-02-28 MED ORDER — SULFAMETHOXAZOLE-TRIMETHOPRIM 800-160 MG PO TABS: 1.0000 | ORAL_TABLET | Freq: Two times a day (BID) | ORAL | 0 refills | Status: AC

## 2016-02-28 MED ORDER — METRONIDAZOLE 500 MG PO TABS: 500.0000 mg | ORAL_TABLET | Freq: Two times a day (BID) | ORAL | 0 refills | Status: DC

## 2016-02-28 MED ORDER — SODIUM CHLORIDE 0.9 % IV BOLUS (SEPSIS)
1000.0000 mL | INTRAVENOUS | Status: AC
  Administered 2016-02-28: 1000 mL via INTRAVENOUS

## 2016-02-28 NOTE — ED Provider Notes (Signed)
[facility]-EMERGENCY DEPT Provider Note   CSN: [id number] Arrival date & time: 02/28/16  0024     History   Chief Complaint Chief Complaint  Patient presents with  . Abdominal Pain    HPI Rachel Fischer is a 46 y.o. female with a hx of anemia, HTN, migraine headache presents to the Emergency Department complaining of Acute, persistent, progressively worsening right lower quadrant and pelvic pain onset this morning. Patient reports 10/10 pain, sharp and stabbing.  Patient has a history of partial hysterectomy with surgical removal of her uterus and left ovary. No other abdominal surgeries. History of bowel obstruction. No treatments prior to arrival. Patient with associated nonbloody and nonbilious emesis. She denies dysuria, hematuria. She section active with one female partner for the last 3 years and has no history of STD with this partner.  The history is provided by the patient and medical records. No language interpreter was used.    Past Medical History:  Diagnosis Date  . Anemia   . Hypertension   . Lung nodule   . Migraine headache   . Tobacco use     Patient Active Problem List   Diagnosis Date Noted  . Blurred vision   . Cephalalgia   . Vision changes   . Secondary hypertension, unspecified   . Headache 08/06/2015  . Pulmonary nodule 06/07/2011    Past Surgical History:  Procedure Laterality Date  . ABDOMINAL HYSTERECTOMY    . c section x 4    . FOOT SURGERY      OB History    No data available       Home Medications    Prior to Admission medications   Not on File    Family History Family History  Problem Relation Age of Onset  . Breast cancer Mother   . Hypertension Mother   . Hyperlipidemia Mother   . CAD Mother     Social History Social History  Substance Use Topics  . Smoking status: Current Every Day Smoker    Packs/day: 0.50    Years: 19.00    Types: Cigarettes  . Smokeless tobacco: Never Used  . Alcohol use No     Allergies     Ciprofloxacin; Morphine and related; and Penicillins   Review of Systems Review of Systems  Gastrointestinal: Positive for abdominal pain, nausea and vomiting.  Genitourinary: Positive for pelvic pain and vaginal discharge.  All other systems reviewed and are negative.    Physical Exam Updated Vital Signs BP 138/87 (BP Location: Right Arm)   Pulse 77   Temp 98.3 F (36.8 C) (Oral)   Resp 16   SpO2 99%   Physical Exam  Constitutional: She appears well-developed and well-nourished. She appears distressed.  Awake, alert, nontoxic appearance Patient writhing in bed and actively vomiting Tearful  HENT:  Head: Normocephalic and atraumatic.  Mouth/Throat: Oropharynx is clear and moist. No oropharyngeal exudate.  Eyes: Conjunctivae are normal. No scleral icterus.  Neck: Normal range of motion. Neck supple.  Cardiovascular: Normal rate, regular rhythm, normal heart sounds and intact distal pulses.   No murmur heard. Pulmonary/Chest: Effort normal and breath sounds normal. No respiratory distress. She has no wheezes.  Equal chest expansion  Abdominal: Soft. Bowel sounds are normal. She exhibits no distension and no mass. There is tenderness. There is guarding. There is no rebound. Hernia confirmed negative in the right inguinal area and confirmed negative in the left inguinal area.  Genitourinary: No labial fusion. There is no rash,  tenderness or lesion on the right labia. There is no rash, tenderness or lesion on the left labia. Right adnexum displays mass and tenderness. Right adnexum displays no fullness. Left adnexum displays no mass, no tenderness and no fullness. No erythema, tenderness or bleeding in the vagina. No foreign body in the vagina. No signs of injury around the vagina. Vaginal discharge ( thick, white) found.  Musculoskeletal: Normal range of motion. She exhibits no edema.  Lymphadenopathy:       Right: No inguinal adenopathy present.       Left: No inguinal  adenopathy present.  Neurological: She is alert.  Speech is clear and goal oriented Moves extremities without ataxia  Skin: Skin is warm. She is diaphoretic. No erythema.  Psychiatric: She has a normal mood and affect.  Nursing note and vitals reviewed.    ED Treatments / Results  Labs (all labs ordered are listed, but only abnormal results are displayed) Labs Reviewed  WET PREP, GENITAL - Abnormal; Notable for the following:       Result Value   Trich, Wet Prep PRESENT (*)    Clue Cells Wet Prep HPF POC PRESENT (*)    WBC, Wet Prep HPF POC FEW (*)    All other components within normal limits  LIPASE, BLOOD - Abnormal; Notable for the following:    Lipase 62 (*)    All other components within normal limits  COMPREHENSIVE METABOLIC PANEL - Abnormal; Notable for the following:    Glucose, Bld 104 (*)    ALT 12 (*)    All other components within normal limits  URINALYSIS, ROUTINE W REFLEX MICROSCOPIC - Abnormal; Notable for the following:    Color, Urine AMBER (*)    APPearance TURBID (*)    Hgb urine dipstick SMALL (*)    Nitrite POSITIVE (*)    Leukocytes, UA LARGE (*)    Bacteria, UA RARE (*)    Squamous Epithelial / LPF 0-5 (*)    All other components within normal limits  CBC  GC/CHLAMYDIA PROBE AMP () NOT AT [facility]    Radiology US Transvaginal Non-ob  Result Date: 02/28/2016 CLINICAL DATA:  Right lower quadrant pain for 2 days EXAM: TRANSABDOMINAL AND TRANSVAGINAL ULTRASOUND OF PELVIS DOPPLER ULTRASOUND OF OVARIES TECHNIQUE: Both transabdominal and transvaginal ultrasound examinations of the pelvis were performed. Transabdominal technique was performed for global imaging of the pelvis including uterus, ovaries, adnexal regions, and pelvic cul-de-sac. It was necessary to proceed with endovaginal exam following the transabdominal exam to visualize the ovaries. Color and duplex Doppler ultrasound was utilized to evaluate blood flow to the ovaries. COMPARISON:  CT  02/28/2016, 04/29/2014 FINDINGS: Uterus Hysterectomy Right ovary Not visible Left ovary Not visible Other findings Midline pelvic mass measuring 4.7 cm. This is not a normal ovary. It could represent an ovarian mass. No normal ovarian tissue is evident. No ascites. IMPRESSION: 1. Nonvisualization of the ovaries. 2. Midline pelvic mass measuring 4.7 cm, solid and indeterminate but possibly of ovarian origin. No abnormal pelvic fluid collections. The mass corresponds to the CT abnormality. Pelvic MRI will provide optimal imaging characterization, if additional imaging is clinically warranted. Electronically Signed   By: Ellery Plunk M.D.   On: 02/28/2016 06:30   US Pelvis Complete  Result Date: 02/28/2016 CLINICAL DATA:  Right lower quadrant pain for 2 days EXAM: TRANSABDOMINAL AND TRANSVAGINAL ULTRASOUND OF PELVIS DOPPLER ULTRASOUND OF OVARIES TECHNIQUE: Both transabdominal and transvaginal ultrasound examinations of the pelvis were performed. Transabdominal technique was performed  for global imaging of the pelvis including uterus, ovaries, adnexal regions, and pelvic cul-de-sac. It was necessary to proceed with endovaginal exam following the transabdominal exam to visualize the ovaries. Color and duplex Doppler ultrasound was utilized to evaluate blood flow to the ovaries. COMPARISON:  CT 02/28/2016, 04/29/2014 FINDINGS: Uterus Hysterectomy Right ovary Not visible Left ovary Not visible Other findings Midline pelvic mass measuring 4.7 cm. This is not a normal ovary. It could represent an ovarian mass. No normal ovarian tissue is evident. No ascites. IMPRESSION: 1. Nonvisualization of the ovaries. 2. Midline pelvic mass measuring 4.7 cm, solid and indeterminate but possibly of ovarian origin. No abnormal pelvic fluid collections. The mass corresponds to the CT abnormality. Pelvic MRI will provide optimal imaging characterization, if additional imaging is clinically warranted. Electronically Signed   By:  Ellery Plunkaniel R Mitchell M.D.   On: 02/28/2016 06:30   Ct Abdomen Pelvis W Contrast  Result Date: 02/28/2016 CLINICAL DATA:  46 y/o F; 2 days of right lower quadrant pain with nausea and vomiting. EXAM: CT ABDOMEN AND PELVIS WITH CONTRAST TECHNIQUE: Multidetector CT imaging of the abdomen and pelvis was performed using the standard protocol following bolus administration of intravenous contrast. CONTRAST:  100mL ISOVUE-300 IOPAMIDOL (ISOVUE-300) INJECTION 61% COMPARISON:  04/29/2014 CT of the abdomen and pelvis. FINDINGS: Lower chest: No acute abnormality. Hepatobiliary: No focal liver abnormality is seen. No gallstones, gallbladder wall thickening, or biliary dilatation. Pancreas: Unremarkable. No pancreatic ductal dilatation or surrounding inflammatory changes. Spleen: Normal in size without focal abnormality. Adrenals/Urinary Tract: 12 x 7 mm right adrenal nodule is stable (series 4 image 48). Kidneys are unremarkable. Left adrenal gland is unremarkable. No obstructive uropathy. Normal bladder. Stomach/Bowel: Stomach is within normal limits. Appendix appears normal. No evidence of bowel wall thickening, distention, or inflammatory changes. Vascular/Lymphatic: No significant vascular findings are present. No enlarged abdominal or pelvic lymph nodes. Reproductive: Low-attenuation well-circumscribed lesion within the right adnexa measuring 30 x 43 mm (AP by ML) (series 9, image 59). In comparison with the prior CT this has increased in size and now demonstrates increased heterogeneity. Other: No abdominal wall hernia or abnormality. No abdominopelvic ascites. Musculoskeletal: No acute or significant osseous findings. IMPRESSION: 1. Low-attenuation well-circumscribed lesion within right adnexa measures up to 4.3 cm and has increased in size and complexity in comparison with the prior CT of the abdomen and pelvis. Pelvic ultrasound is recommended for further evaluation. This recommendation follows ACR consensus  guidelines: White Paper of the ACR Incidental Findings Committee II on Adnexal Findings. J Am Coll Radiol 2013:10:675-681. 2. 12 mm right adrenal nodule is stable consistent with benign etiology, likely adenoma. 3. Otherwise no acute process of abdomen or pelvis is identified. Electronically Signed   By: Mitzi HansenLance  Furusawa-Stratton M.D.   On: 02/28/2016 05:12   [place] Art/ven Flow Abd Pelv Doppler  Result Date: 02/28/2016 CLINICAL DATA:  Right lower quadrant pain for 2 days EXAM: TRANSABDOMINAL AND TRANSVAGINAL ULTRASOUND OF PELVIS DOPPLER ULTRASOUND OF OVARIES TECHNIQUE: Both transabdominal and transvaginal ultrasound examinations of the pelvis were performed. Transabdominal technique was performed for global imaging of the pelvis including uterus, ovaries, adnexal regions, and pelvic cul-de-sac. It was necessary to proceed with endovaginal exam following the transabdominal exam to visualize the ovaries. Color and duplex Doppler ultrasound was utilized to evaluate blood flow to the ovaries. COMPARISON:  CT 02/28/2016, 04/29/2014 FINDINGS: Uterus Hysterectomy Right ovary Not visible Left ovary Not visible Other findings Midline pelvic mass measuring 4.7 cm. This is not a normal ovary. It could  represent an ovarian mass. No normal ovarian tissue is evident. No ascites. IMPRESSION: 1. Nonvisualization of the ovaries. 2. Midline pelvic mass measuring 4.7 cm, solid and indeterminate but possibly of ovarian origin. No abnormal pelvic fluid collections. The mass corresponds to the CT abnormality. Pelvic MRI will provide optimal imaging characterization, if additional imaging is clinically warranted. Electronically Signed   By: Ellery Plunk M.D.   On: 02/28/2016 06:30    Procedures Procedures (including critical care time)  Medications Ordered in ED Medications  fentaNYL (SUBLIMAZE) injection 50 mcg (50 mcg Intravenous Given 02/28/16 0514)  ondansetron (ZOFRAN-ODT) 4 MG disintegrating tablet (4 mg  Given 02/28/16  0037)  sodium chloride 0.9 % bolus 1,000 mL (1,000 mLs Intravenous New Bag/Given 02/28/16 0334)  iopamidol (ISOVUE-300) 61 % injection (100 mLs  Contrast Given 02/28/16 0415)     Initial Impression / Assessment and Plan / ED Course  I have reviewed the triage vital signs and the nursing notes.  Pertinent labs & imaging results that were available during my care of the patient were reviewed by me and considered in my medical decision making (see chart for details).  Clinical Course     Patient presents with acute pain and appears distressed. She's diaphoretic, writhing in bed and vomiting. Acute right lower quadrant abdominal pain is progressive throughout the day. Concern for appendicitis versus ovarian torsion. CT scan shows large right adnexal mass concerning for possible ovarian tissue. Ultrasound ordered without ability to visualize ovaries. Unable to rule out ovarian torsion. No evidence of appendicitis. Discussed with Dr. Emelda Fear at [facility] who agrees that further evaluation is warranted. Patient has no OB/GYN in the area. Will transfer at this time.  Final Clinical Impressions(s) / ED Diagnoses   Final diagnoses:  Ovarian torsion  Right lower quadrant abdominal pain  Non-intractable vomiting with nausea, unspecified vomiting type    New Prescriptions New Prescriptions   No medications on file     Dierdre Forth, PA-C 02/28/16 3244    Gilda Crease, MD 03/01/16 [phone]

## 2016-02-28 NOTE — ED Triage Notes (Signed)
Pt endorses lower abd pain radiating to the back that began today with nausea. Pt also endorses dysuria. Pt denies vomiting or diarrhea. VSS. NAD.

## 2016-02-28 NOTE — MAU Provider Note (Signed)
History     CSN: [id number]  Arrival date and time: 02/28/16 0024   Seen by provider at 1040  Chief Complaint  Patient presents with  . Abdominal Pain   HPI Rachel Fischer 46 y.o.   Client transferred to MAU from [facility] with RLQ pain (had also had nausea and vomiting earlier) and for reevaluation of pelvic ultrasound.  Pelvic mass vs ovarian cyst.  Client has had a hysterectomy.  [facility]  Discussed plan of care with Dr. Penne LashLeggett.  Will get transvaginal ultrasound to see if there is ovarian torsion which is causing the pain.  Appendicitis ruled out earlier and client is no longer vomiting.  OB History    Gravida Para Term Preterm AB Living   4 4 4     4    SAB TAB Ectopic Multiple Live Births           4      Past Medical History:  Diagnosis Date  . Anemia   . Hypertension   . Lung nodule   . Migraine headache   . Tobacco use     Past Surgical History:  Procedure Laterality Date  . ABDOMINAL HYSTERECTOMY    . c section x 4    . CESAREAN SECTION    . FOOT SURGERY      Family History  Problem Relation Age of Onset  . Breast cancer Mother   . Hypertension Mother   . Hyperlipidemia Mother   . CAD Mother     Social History  Substance Use Topics  . Smoking status: Current Every Day Smoker    Packs/day: 0.50    Years: 19.00    Types: Cigarettes  . Smokeless tobacco: Never Used  . Alcohol use No    Allergies:  Allergies  Allergen Reactions  . Ciprofloxacin Other (See Comments)    Generalized muscle and joint pain.  Marland Kitchen. Morphine And Related Itching and Nausea And Vomiting  . Penicillins Hives    Has patient had a PCN reaction causing immediate rash, facial/tongue/throat swelling, SOB or lightheadedness with hypotension: No Has patient had a PCN reaction causing severe rash involving mucus membranes or skin necrosis: No Has patient had a PCN reaction that required hospitalization No Has patient had a PCN reaction occurring within the last 10  years: No If all of the above answers are "NO", then may proceed with Cephalosporin use.      No prescriptions prior to admission.    Review of Systems  Constitutional: Negative for fever.  Gastrointestinal:       Right lower quadrant pain currently with no nausea or vomiting.   Physical Exam   Blood pressure 156/93, pulse 72, temperature 97.9 F (36.6 C), temperature source Oral, resp. rate 17, height 5' (1.524 m), weight 130 lb (59 kg), SpO2 99 %.  Physical Exam  Nursing note and vitals reviewed. Constitutional: She is oriented to person, place, and time. She appears well-developed and well-nourished.  HENT:  Head: Normocephalic.  Eyes: EOM are normal.  Neck: Neck supple.  GI: Soft. There is tenderness. There is no rebound and no guarding.  RLQ tenderness to palpation  Musculoskeletal: Normal range of motion.  Neurological: She is alert and oriented to person, place, and time.  Skin: Skin is warm and dry.  Psychiatric: She has a normal mood and affect.    MAU Course  Procedures CLINICAL DATA:  Right adnexal mass, remote hysterectomy and left salpingo oophorectomy  EXAM: TRANSVAGINAL ULTRASOUND  OF PELVIS  DOPPLER ULTRASOUND OF OVARIES  TECHNIQUE: Transvaginal ultrasound examination of the pelvis was performed including evaluation of the uterus, ovaries, adnexal regions, and pelvic cul-de-sac.  Color and duplex Doppler ultrasound was utilized to evaluate blood flow to the ovaries.  COMPARISON:  02/28/2016  FINDINGS: Uterus  Surgically absent  Endometrium  Surgically absent  Right ovary  Measurements: 5.1 x 3.7 x 4.2 cm. Right ovary appears enlarged with a heterogeneous isoechoic partially cystic mass measuring 4 x 3.2 x 3 cm. This correlates with the previous CT finding. Right ovarian mass remains indeterminate by ultrasound. Consider further evaluation with MRI versus gynecologic consultation.  Left ovary  Surgically absent.  No  left adnexal abnormality.  Pulsed Doppler evaluation demonstrates normal low-resistance arterial and venous waveforms in right ovary.  IMPRESSION: Previous hysterectomy and left salpingo-oophorectomy.  4 cm heterogeneous isoechoic and partially cystic right ovarian mass remains indeterminate by ultrasound. Recommend further evaluation with nonemergent pelvic MRI versus gynecologic evaluation.  No evidence of torsion. MDM 1150  Consult with Dr. Penne Lash re: plan of care Toradol 60 mg IM in MAU  Assessment and Plan  Hemorrhagic ovarian cyst - no torsion evident Trichomonas and BV UTI Untreated hypertension for 4 months   Plan Will discharge with pain medication - vicodin x 5 days to control pain - printed RX May take ibuprofen by the package directions also to help with pain management. Will have the clinic call you and schedule a follow up appointment. Get your prescriptions at the pharmacy and take as directed Medication for UTI, Trich, and diflucan tablet (client request to cover yeast after antibiotics) sent to her pharmacy Also has taken BP medication since 2008 and has been off medicationi for 4 months.  Now has insurance and requests one month of BP meds - sent to her pharmacy. Make an appointment with a PCP to evaluate your hypertension and continue to prescribe your medication.  Terri L Burleson 02/28/2016, 2:10 PM

## 2016-02-28 NOTE — ED Notes (Signed)
Pt experiencing dry heaves at this time.

## 2016-02-28 NOTE — Discharge Instructions (Signed)
Expect the clinic to call you to schedule a follow up appointment. Refer all sex partners to the STD clinic at the [facility] department for treatment for trichomonas No sex until 10 days after they have been treated. Get your medicines and take all as directed.

## 2016-02-28 NOTE — ED Notes (Signed)
Pt given 4mg  zofran odt

## 2016-02-28 NOTE — ED Notes (Signed)
Patient transported to Ultrasound 

## 2016-02-28 NOTE — MAU Note (Signed)
[facility] due to abdominal pain. Pt states the pain started yesterday morning and has slowly been worsening. Pt denies vaginal bleeding.

## 2016-02-29 LAB — GC/CHLAMYDIA PROBE AMP (~~LOC~~) NOT AT ARMC
CHLAMYDIA, DNA PROBE: NEGATIVE
NEISSERIA GONORRHEA: NEGATIVE

## 2016-03-01 ENCOUNTER — Encounter: Payer: Self-pay | Admitting: General Practice

## 2016-03-03 ENCOUNTER — Inpatient Hospital Stay (HOSPITAL_COMMUNITY)
Admission: AD | Admit: 2016-03-03 | Discharge: 2016-03-03 | Disposition: A | Discharge status: Home / Self Care | Payer: BLUE CROSS/BLUE SHIELD | Source: Ambulatory Visit | Attending: Obstetrics and Gynecology | Admitting: Obstetrics and Gynecology

## 2016-03-03 ENCOUNTER — Inpatient Hospital Stay (HOSPITAL_COMMUNITY): Payer: BLUE CROSS/BLUE SHIELD

## 2016-03-03 ENCOUNTER — Encounter (HOSPITAL_COMMUNITY): Payer: Self-pay | Admitting: *Deleted

## 2016-03-03 DIAGNOSIS — G43909 Migraine, unspecified, not intractable, without status migrainosus: Secondary | ICD-10-CM | POA: Insufficient documentation

## 2016-03-03 DIAGNOSIS — N83201 Unspecified ovarian cyst, right side: Secondary | ICD-10-CM | POA: Insufficient documentation

## 2016-03-03 DIAGNOSIS — F17210 Nicotine dependence, cigarettes, uncomplicated: Secondary | ICD-10-CM | POA: Insufficient documentation

## 2016-03-03 DIAGNOSIS — R1031 Right lower quadrant pain: Secondary | ICD-10-CM | POA: Insufficient documentation

## 2016-03-03 DIAGNOSIS — Z8249 Family history of ischemic heart disease and other diseases of the circulatory system: Secondary | ICD-10-CM | POA: Diagnosis not present

## 2016-03-03 DIAGNOSIS — Z90710 Acquired absence of both cervix and uterus: Secondary | ICD-10-CM | POA: Insufficient documentation

## 2016-03-03 DIAGNOSIS — N390 Urinary tract infection, site not specified: Secondary | ICD-10-CM | POA: Insufficient documentation

## 2016-03-03 DIAGNOSIS — I10 Essential (primary) hypertension: Secondary | ICD-10-CM | POA: Diagnosis not present

## 2016-03-03 DIAGNOSIS — N83209 Unspecified ovarian cyst, unspecified side: Secondary | ICD-10-CM

## 2016-03-03 DIAGNOSIS — N3000 Acute cystitis without hematuria: Secondary | ICD-10-CM | POA: Diagnosis not present

## 2016-03-03 LAB — COMPREHENSIVE METABOLIC PANEL
ALT: 11 U/L — ABNORMAL LOW (ref 14–54)
ANION GAP: 10 (ref 5–15)
AST: 19 U/L (ref 15–41)
Albumin: 4.7 g/dL (ref 3.5–5.0)
Alkaline Phosphatase: 50 U/L (ref 38–126)
BUN: 10 mg/dL (ref 6–20)
CALCIUM: 9.1 mg/dL (ref 8.9–10.3)
CHLORIDE: 100 mmol/L — AB (ref 101–111)
CO2: 23 mmol/L (ref 22–32)
Creatinine, Ser: 0.76 mg/dL (ref 0.44–1.00)
GFR calc non Af Amer: >60 mL/min (ref >60)
Glucose, Bld: 106 mg/dL — ABNORMAL HIGH (ref 65–99)
Potassium: 3.5 mmol/L (ref 3.5–5.1)
SODIUM: 133 mmol/L — AB (ref 135–145)
Total Bilirubin: 0.5 mg/dL (ref 0.3–1.2)
Total Protein: 7.9 g/dL (ref 6.5–8.1)

## 2016-03-03 LAB — URINALYSIS, ROUTINE W REFLEX MICROSCOPIC
Bacteria, UA: NONE SEEN
Bilirubin Urine: NEGATIVE
GLUCOSE, UA: NEGATIVE mg/dL
Ketones, ur: NEGATIVE mg/dL
NITRITE: NEGATIVE
PH: 5 (ref 5.0–8.0)
PROTEIN: NEGATIVE mg/dL
Specific Gravity, Urine: 1.02 (ref 1.005–1.030)

## 2016-03-03 LAB — CBC WITH DIFFERENTIAL/PLATELET
Basophils Absolute: 0 10*3/uL (ref 0.0–0.1)
Basophils Relative: 0 %
EOS ABS: 0.2 10*3/uL (ref 0.0–0.7)
EOS PCT: 2 %
HCT: 38.5 % (ref 36.0–46.0)
Hemoglobin: 13.4 g/dL (ref 12.0–15.0)
LYMPHS ABS: 1.3 10*3/uL (ref 0.7–4.0)
Lymphocytes Relative: 18 %
MCH: 28.9 pg (ref 26.0–34.0)
MCHC: 34.8 g/dL (ref 30.0–36.0)
MCV: 83.2 fL (ref 78.0–100.0)
MONOS PCT: 7 %
Monocytes Absolute: 0.5 10*3/uL (ref 0.1–1.0)
Neutro Abs: 5.2 10*3/uL (ref 1.7–7.7)
Neutrophils Relative %: 73 %
PLATELETS: 326 10*3/uL (ref 150–400)
RBC: 4.63 MIL/uL (ref 3.87–5.11)
RDW: 13.2 % (ref 11.5–15.5)
WBC: 7.1 10*3/uL (ref 4.0–10.5)

## 2016-03-03 LAB — AMYLASE: Amylase: 60 U/L (ref 28–100)

## 2016-03-03 LAB — LIPASE, BLOOD: Lipase: 15 U/L (ref 11–51)

## 2016-03-03 MED ORDER — KETOROLAC TROMETHAMINE 60 MG/2ML IM SOLN
60.0000 mg | Freq: Once | INTRAMUSCULAR | Status: AC
  Administered 2016-03-03: 60 mg via INTRAMUSCULAR
  Filled 2016-03-03: qty 2

## 2016-03-03 MED ORDER — ONDANSETRON 8 MG PO TBDP
8.0000 mg | ORAL_TABLET | Freq: Once | ORAL | Status: AC
  Administered 2016-03-03: 8 mg via ORAL
  Filled 2016-03-03: qty 1

## 2016-03-03 MED ORDER — OXYCODONE-ACETAMINOPHEN 5-325 MG PO TABS: 1.0000 | ORAL_TABLET | Freq: Four times a day (QID) | ORAL | 0 refills | Status: DC | PRN

## 2016-03-03 NOTE — MAU Note (Signed)
Being treated for trich and UTI; last pain med at 1100 yesterday;

## 2016-03-03 NOTE — MAU Provider Note (Signed)
Chief Complaint:  Abdominal Pain   First Provider Initiated Contact with Patient 03/03/16 [phone]       HPI: Rachel Fischer is a 46 y.o. [id number] who presents to maternity admissions reporting persistent right upper and lower abdominal pain which radiates to right flank.  Known right ovarian mass, torsion ruled out on Sunday.  Did have an elevated Lipase that day but normal WBC.. She reports no vaginal bleeding, vaginal itching/burning, urinary symptoms, h/a, dizziness, n/v, or fever/chills.    States stopped vicodin yesterday because it was not working.  Vomiting is worse.   Abdominal Pain  This is a recurrent problem. The current episode started in the past 7 days. The onset quality is gradual. The problem occurs constantly. The problem has been gradually worsening. The pain is located in the RLQ, right flank and RUQ. The pain is severe. The quality of the pain is cramping, sharp and burning. The abdominal pain radiates to the right flank. Associated symptoms include nausea and vomiting. Pertinent negatives include no constipation, diarrhea, dysuria, fever, headaches or myalgias. The pain is aggravated by movement and palpation. The pain is relieved by nothing. She has tried oral narcotic analgesics for the symptoms. The treatment provided no relief.    RN Note: Is hurting.  Was here on Sun.  Has a mass on her ovary.   Pain is getting worser, preventing her from eating or sleeping.  Medication is not helping at all.  Can't keep nothing down now, started vomiting yesterday.  Has not been called be clinic yet.  (pt can not sit still)  Past Medical History: Past Medical History:  Diagnosis Date  . Anemia   . Hypertension   . Lung nodule   . Migraine headache   . Tobacco use     Past obstetric history: OB History  Gravida Para Term Preterm AB Living  4 4 4     4   SAB TAB Ectopic Multiple Live Births          4    # Outcome Date GA Lbr Len/2nd Weight Sex Delivery Anes PTL Lv  4 Term       CS-LTranv     3 Term      CS-LTranv     2 Term      CS-LTranv     1 Term      CS-LTranv         Past Surgical History: Past Surgical History:  Procedure Laterality Date  . ABDOMINAL HYSTERECTOMY    . c section x 4    . CESAREAN SECTION    . FOOT SURGERY      Family History: Family History  Problem Relation Age of Onset  . Breast cancer Mother   . Hypertension Mother   . Hyperlipidemia Mother   . CAD Mother     Social History: Social History  Substance Use Topics  . Smoking status: Current Every Day Smoker    Packs/day: 0.50    Years: 19.00    Types: Cigarettes  . Smokeless tobacco: Never Used  . Alcohol use No    Allergies:  Allergies  Allergen Reactions  . Ciprofloxacin Other (See Comments)    Generalized muscle and joint pain.  Marland Kitchen Morphine And Related Itching and Nausea And Vomiting  . Penicillins Hives    Has patient had a PCN reaction causing immediate rash, facial/tongue/throat swelling, SOB or lightheadedness with hypotension: No Has patient had a PCN reaction causing severe rash involving mucus membranes or  skin necrosis: No Has patient had a PCN reaction that required hospitalization No Has patient had a PCN reaction occurring within the last 10 years: No If all of the above answers are "NO", then may proceed with Cephalosporin use.     Meds:  Prescriptions Prior to Admission  Medication Sig Dispense Refill Last Dose  . fluconazole (DIFLUCAN) 150 MG tablet Take 1 tablet (150 mg total) by mouth daily. 1 tablet 0   . HYDROcodone-acetaminophen (NORCO/VICODIN) 5-325 MG tablet Take 1 tablet by mouth every 4 (four) hours as needed. If needed for severe pain 20 tablet 0   . lisinopril-hydrochlorothiazide (PRINZIDE,ZESTORETIC) 10-12.5 MG tablet Take 1 tablet by mouth daily. 30 tablet 0   . metroNIDAZOLE (FLAGYL) 500 MG tablet Take 1 tablet (500 mg total) by mouth 2 (two) times daily. No alcohol while taking this medication 14 tablet 0     I have reviewed  patient's Past Medical Hx, Surgical Hx, Family Hx, Social Hx, medications and allergies.  ROS:  Review of Systems  Constitutional: Negative for chills and fever.  Respiratory: Negative for shortness of breath.   Gastrointestinal: Positive for abdominal pain, nausea and vomiting. Negative for constipation and diarrhea.  Genitourinary: Positive for flank pain. Negative for dysuria, vaginal bleeding and vaginal discharge.  Musculoskeletal: Negative for myalgias.  Neurological: Negative for headaches.   Other systems negative     Physical Exam  Patient Vitals for the past 24 hrs:  BP Temp Temp src Pulse Resp Weight  03/03/16 0716 118/88 97.4 F (36.3 C) Oral 107 20 124 lb 8 oz (56.5 kg)   Constitutional: Well-developed, well-nourished female in no acute distress, but rocking with pain. .  Cardiovascular: normal rate and rhythm, no ectopy audible, S1 & S2 heard, no murmur Respiratory: normal effort, no distress. Lungs CTAB with no wheezes or crackles GI: Abd soft, tender over right mid/lower abdomen. There is more significant tenderness in RUQ with + CVAT on right..  Nondistended.  No rebound, No guarding.  Bowel Sounds audible  MS: Extremities nontender, no edema, normal ROM Neurologic: Alert and oriented x 4.   Grossly nonfocal. GU: Neg CVAT. Skin:  Warm and Dry Psych:  Affect appropriate.  PELVIC EXAM: deferred   Labs: Results for orders placed or performed during the hospital encounter of 03/03/16 (from the past 24 hour(s))  CBC with Differential/Platelet     Status: None   Collection Time: 03/03/16  8:12 AM  Result Value Ref Range   WBC 7.1 4.0 - 10.5 K/uL   RBC 4.63 3.87 - 5.11 MIL/uL   Hemoglobin 13.4 12.0 - 15.0 g/dL   HCT 40.9 81.1 - 91.4 %   MCV 83.2 78.0 - 100.0 fL   MCH 28.9 26.0 - 34.0 pg   MCHC 34.8 30.0 - 36.0 g/dL   RDW 78.2 95.6 - 21.3 %   Platelets 326 150 - 400 K/uL   Neutrophils Relative % 73 %   Neutro Abs 5.2 1.7 - 7.7 K/uL   Lymphocytes Relative 18 %    Lymphs Abs 1.3 0.7 - 4.0 K/uL   Monocytes Relative 7 %   Monocytes Absolute 0.5 0.1 - 1.0 K/uL   Eosinophils Relative 2 %   Eosinophils Absolute 0.2 0.0 - 0.7 K/uL   Basophils Relative 0 %   Basophils Absolute 0.0 0.0 - 0.1 K/uL  Comprehensive metabolic panel     Status: Abnormal   Collection Time: 03/03/16  8:13 AM  Result Value Ref Range   Sodium 133 (L)  135 - 145 mmol/L   Potassium 3.5 3.5 - 5.1 mmol/L   Chloride 100 (L) 101 - 111 mmol/L   CO2 23 22 - 32 mmol/L   Glucose, Bld 106 (H) 65 - 99 mg/dL   BUN 10 6 - 20 mg/dL   Creatinine, Ser 4.090.76 0.44 - 1.00 mg/dL   Calcium 9.1 8.9 - 81.110.3 mg/dL   Total Protein 7.9 6.5 - 8.1 g/dL   Albumin 4.7 3.5 - 5.0 g/dL   AST 19 15 - 41 U/L   ALT 11 (L) 14 - 54 U/L   Alkaline Phosphatase 50 38 - 126 U/L   Total Bilirubin 0.5 0.3 - 1.2 mg/dL   GFR calc non Af Amer >60 >60 mL/min   GFR calc Af Amer >60 >60 mL/min   Anion gap 10 5 - 15  Amylase     Status: None   Collection Time: 03/03/16  8:13 AM  Result Value Ref Range   Amylase 60 28 - 100 U/L  Lipase, blood     Status: None   Collection Time: 03/03/16  8:13 AM  Result Value Ref Range   Lipase 15 11 - 51 U/L  Urinalysis, Routine w reflex microscopic     Status: Abnormal   Collection Time: 03/03/16  8:47 AM  Result Value Ref Range   Color, Urine YELLOW YELLOW   APPearance HAZY (A) CLEAR   Specific Gravity, Urine 1.020 1.005 - 1.030   pH 5.0 5.0 - 8.0   Glucose, UA NEGATIVE NEGATIVE mg/dL   Hgb urine dipstick SMALL (A) NEGATIVE   Bilirubin Urine NEGATIVE NEGATIVE   Ketones, ur NEGATIVE NEGATIVE mg/dL   Protein, ur NEGATIVE NEGATIVE mg/dL   Nitrite NEGATIVE NEGATIVE   Leukocytes, UA TRACE (A) NEGATIVE   RBC / HPF 0-5 0 - 5 RBC/hpf   WBC, UA 0-5 0 - 5 WBC/hpf   Bacteria, UA NONE SEEN NONE SEEN   Squamous Epithelial / LPF 0-5 (A) NONE SEEN   Mucous PRESENT        Imaging:  [place] Transvaginal Non-ob  Result Date: 02/28/2016 CLINICAL DATA:  Right adnexal mass, remote  hysterectomy and left salpingo oophorectomy EXAM: TRANSVAGINAL ULTRASOUND OF PELVIS DOPPLER ULTRASOUND OF OVARIES TECHNIQUE: Transvaginal ultrasound examination of the pelvis was performed including evaluation of the uterus, ovaries, adnexal regions, and pelvic cul-de-sac. Color and duplex Doppler ultrasound was utilized to evaluate blood flow to the ovaries. COMPARISON:  02/28/2016 FINDINGS: Uterus Surgically absent Endometrium Surgically absent Right ovary Measurements: 5.1 x 3.7 x 4.2 cm. Right ovary appears enlarged with a heterogeneous isoechoic partially cystic mass measuring 4 x 3.2 x 3 cm. This correlates with the previous CT finding. Right ovarian mass remains indeterminate by ultrasound. Consider further evaluation with MRI versus gynecologic consultation. Left ovary Surgically absent.  No left adnexal abnormality. Pulsed Doppler evaluation demonstrates normal low-resistance arterial and venous waveforms in right ovary. IMPRESSION: Previous hysterectomy and left salpingo-oophorectomy. 4 cm heterogeneous isoechoic and partially cystic right ovarian mass remains indeterminate by ultrasound. Recommend further evaluation with nonemergent pelvic MRI versus gynecologic evaluation. No evidence of torsion. Electronically Signed   By: Judie PetitM.  Shick M.D.   On: 02/28/2016 11:38   [place] Transvaginal Non-ob  Result Date: 02/28/2016 CLINICAL DATA:  Right lower quadrant pain for 2 days EXAM: TRANSABDOMINAL AND TRANSVAGINAL ULTRASOUND OF PELVIS DOPPLER ULTRASOUND OF OVARIES TECHNIQUE: Both transabdominal and transvaginal ultrasound examinations of the pelvis were performed. Transabdominal technique was performed for global imaging of the pelvis including uterus, ovaries, adnexal  regions, and pelvic cul-de-sac. It was necessary to proceed with endovaginal exam following the transabdominal exam to visualize the ovaries. Color and duplex Doppler ultrasound was utilized to evaluate blood flow to the ovaries. COMPARISON:  CT  02/28/2016, 04/29/2014 FINDINGS: Uterus Hysterectomy Right ovary Not visible Left ovary Not visible Other findings Midline pelvic mass measuring 4.7 cm. This is not a normal ovary. It could represent an ovarian mass. No normal ovarian tissue is evident. No ascites. IMPRESSION: 1. Nonvisualization of the ovaries. 2. Midline pelvic mass measuring 4.7 cm, solid and indeterminate but possibly of ovarian origin. No abnormal pelvic fluid collections. The mass corresponds to the CT abnormality. Pelvic MRI will provide optimal imaging characterization, if additional imaging is clinically warranted. Electronically Signed   By: Ellery Plunk M.D.   On: 02/28/2016 06:30   US Pelvis Complete  Result Date: 02/28/2016 CLINICAL DATA:  Right lower quadrant pain for 2 days EXAM: TRANSABDOMINAL AND TRANSVAGINAL ULTRASOUND OF PELVIS DOPPLER ULTRASOUND OF OVARIES TECHNIQUE: Both transabdominal and transvaginal ultrasound examinations of the pelvis were performed. Transabdominal technique was performed for global imaging of the pelvis including uterus, ovaries, adnexal regions, and pelvic cul-de-sac. It was necessary to proceed with endovaginal exam following the transabdominal exam to visualize the ovaries. Color and duplex Doppler ultrasound was utilized to evaluate blood flow to the ovaries. COMPARISON:  CT 02/28/2016, 04/29/2014 FINDINGS: Uterus Hysterectomy Right ovary Not visible Left ovary Not visible Other findings Midline pelvic mass measuring 4.7 cm. This is not a normal ovary. It could represent an ovarian mass. No normal ovarian tissue is evident. No ascites. IMPRESSION: 1. Nonvisualization of the ovaries. 2. Midline pelvic mass measuring 4.7 cm, solid and indeterminate but possibly of ovarian origin. No abnormal pelvic fluid collections. The mass corresponds to the CT abnormality. Pelvic MRI will provide optimal imaging characterization, if additional imaging is clinically warranted. Electronically Signed   By:  Ellery Plunk M.D.   On: 02/28/2016 06:30   Ct Abdomen Pelvis W Contrast  Result Date: 02/28/2016 CLINICAL DATA:  46 y/o F; 2 days of right lower quadrant pain with nausea and vomiting. EXAM: CT ABDOMEN AND PELVIS WITH CONTRAST TECHNIQUE: Multidetector CT imaging of the abdomen and pelvis was performed using the standard protocol following bolus administration of intravenous contrast. CONTRAST:  ISOVUE-300 IOPAMIDOL (ISOVUE-300) INJECTION 61% COMPARISON:  04/29/2014 CT of the abdomen and pelvis. FINDINGS: Lower chest: No acute abnormality. Hepatobiliary: No focal liver abnormality is seen. No gallstones, gallbladder wall thickening, or biliary dilatation. Pancreas: Unremarkable. No pancreatic ductal dilatation or surrounding inflammatory changes. Spleen: Normal in size without focal abnormality. Adrenals/Urinary Tract: 12 x 7 mm right adrenal nodule is stable (series 4 image 48). Kidneys are unremarkable. Left adrenal gland is unremarkable. No obstructive uropathy. Normal bladder. Stomach/Bowel: Stomach is within normal limits. Appendix appears normal. No evidence of bowel wall thickening, distention, or inflammatory changes. Vascular/Lymphatic: No significant vascular findings are present. No enlarged abdominal or pelvic lymph nodes. Reproductive: Low-attenuation well-circumscribed lesion within the right adnexa measuring 30 x 43 mm (AP by ML) (series 9, image 59). In comparison with the prior CT this has increased in size and now demonstrates increased heterogeneity. Other: No abdominal wall hernia or abnormality. No abdominopelvic ascites. Musculoskeletal: No acute or significant osseous findings. IMPRESSION: 1. Low-attenuation well-circumscribed lesion within right adnexa measures up to 4.3 cm and has increased in size and complexity in comparison with the prior CT of the abdomen and pelvis. Pelvic ultrasound is recommended for further evaluation. This recommendation follows  ACR consensus  guidelines: White Paper of the ACR Incidental Findings Committee II on Adnexal Findings. J Am Coll Radiol 2013:10:675-681. 2. 12 mm right adrenal nodule is stable consistent with benign etiology, likely adenoma. 3. Otherwise no acute process of abdomen or pelvis is identified. Electronically Signed   By: Mitzi Hansen M.D.   On: 02/28/2016 05:12   [place] Art/ven Flow Abd Pelv Doppler  Result Date: 02/28/2016 CLINICAL DATA:  Right adnexal mass, remote hysterectomy and left salpingo oophorectomy EXAM: TRANSVAGINAL ULTRASOUND OF PELVIS DOPPLER ULTRASOUND OF OVARIES TECHNIQUE: Transvaginal ultrasound examination of the pelvis was performed including evaluation of the uterus, ovaries, adnexal regions, and pelvic cul-de-sac. Color and duplex Doppler ultrasound was utilized to evaluate blood flow to the ovaries. COMPARISON:  02/28/2016 FINDINGS: Uterus Surgically absent Endometrium Surgically absent Right ovary Measurements: 5.1 x 3.7 x 4.2 cm. Right ovary appears enlarged with a heterogeneous isoechoic partially cystic mass measuring 4 x 3.2 x 3 cm. This correlates with the previous CT finding. Right ovarian mass remains indeterminate by ultrasound. Consider further evaluation with MRI versus gynecologic consultation. Left ovary Surgically absent.  No left adnexal abnormality. Pulsed Doppler evaluation demonstrates normal low-resistance arterial and venous waveforms in right ovary. IMPRESSION: Previous hysterectomy and left salpingo-oophorectomy. 4 cm heterogeneous isoechoic and partially cystic right ovarian mass remains indeterminate by ultrasound. Recommend further evaluation with nonemergent pelvic MRI versus gynecologic evaluation. No evidence of torsion. Electronically Signed   By: Judie Petit.  Shick M.D.   On: 02/28/2016 11:38   [place] Art/ven Flow Abd Pelv Doppler  Result Date: 02/28/2016 CLINICAL DATA:  Right lower quadrant pain for 2 days EXAM: TRANSABDOMINAL AND TRANSVAGINAL ULTRASOUND OF PELVIS DOPPLER  ULTRASOUND OF OVARIES TECHNIQUE: Both transabdominal and transvaginal ultrasound examinations of the pelvis were performed. Transabdominal technique was performed for global imaging of the pelvis including uterus, ovaries, adnexal regions, and pelvic cul-de-sac. It was necessary to proceed with endovaginal exam following the transabdominal exam to visualize the ovaries. Color and duplex Doppler ultrasound was utilized to evaluate blood flow to the ovaries. COMPARISON:  CT 02/28/2016, 04/29/2014 FINDINGS: Uterus Hysterectomy Right ovary Not visible Left ovary Not visible Other findings Midline pelvic mass measuring 4.7 cm. This is not a normal ovary. It could represent an ovarian mass. No normal ovarian tissue is evident. No ascites. IMPRESSION: 1. Nonvisualization of the ovaries. 2. Midline pelvic mass measuring 4.7 cm, solid and indeterminate but possibly of ovarian origin. No abnormal pelvic fluid collections. The mass corresponds to the CT abnormality. Pelvic MRI will provide optimal imaging characterization, if additional imaging is clinically warranted. Electronically Signed   By: Ellery Plunk M.D.   On: 02/28/2016 06:30    MAU Course/MDM: I have ordered labs as follows:  Repeat CBC, CMET, lipase/amylase, and UA. Will add urine culture, as one not done earlier.   Imaging ordered: [place] CNM ordered dopplers of ovarian mass to r/o torsion Results reviewed.   WBC is normal with no left shift.  Lipase is down to normal (61 > 15),  Creat is down.  Urine looks less infected today.     Given Toradol for pain with zofran. Pain improved significantly .Will give small number of Percocet for temporary pain relief.  Advised to take with ibuprofen for best effect.  Reassured things are looking better, she appears relieved. States she really wants her right ovary removed.  Will refer to clinic to discuss with surgeon.   Pt stable at time of discharge.  Assessment: 1. Ovarian cyst   2.  RLQ  abdominal pain   3..    UTI improved  Plan: Discharge home Recommend Watch bowel function due to not eating and narcotics. Miralax PRN.  Rx sent for Percocet #10 tab for pain, severe Message sent for surgical eval of cyst in clinic May need test of cure urine culture if pain not improved after finishing antibiotic  Encouraged to return here or to other Urgent Care/ED if she develops worsening of symptoms, increase in pain, fever, or other concerning symptoms.   Wynelle Bourgeois CNM, MSN Certified Nurse-Midwife 03/03/2016 8:07 AM

## 2016-03-03 NOTE — Discharge Instructions (Signed)
Ovarian Cyst °An ovarian cyst is a fluid-filled sac on an ovary. The ovaries are organs that make eggs in women. Most ovarian cysts go away on their own and are not cancerous (are benign). Some cysts need treatment. °Follow these instructions at home: °· Take over-the-counter and prescription medicines only as told by your doctor. °· Do not drive or use heavy machinery while taking prescription pain medicine. °· Get pelvic exams and Pap tests as often as told by your doctor. °· Return to your normal activities as told by your doctor. Ask your doctor what activities are safe for you. °· Do not use any products that contain nicotine or tobacco, such as cigarettes and e-cigarettes. If you need help quitting, ask your doctor. °· Keep all follow-up visits as told by your doctor. This is important. °Contact a doctor if: °· Your periods are: °¨ Late. °¨ Irregular. °¨ Painful. °· Your periods stop. °· You have pelvic pain that does not go away. °· You have pressure on your bladder. °· You have trouble making your bladder empty when you pee (urinate). °· You have pain during sex. °· You have any of the following in your belly (abdomen): °¨ A feeling of fullness. °¨ Pressure. °¨ Discomfort. °¨ Pain that does not go away. °¨ Swelling. °· You feel sick most of the time. °· You have trouble pooping (have constipation). °· You are not as hungry as usual (you lose your appetite). °· You get very bad acne. °· You start to have more hair on your body and face. °· You are gaining weight or losing weight without changing your exercise and eating habits. °· You think you may be pregnant. °Get help right away if: °· You have belly pain that is very bad or gets worse. °· You cannot eat or drink without throwing up (vomiting). °· You suddenly get a fever. °· Your period is a lot heavier than usual. °This information is not intended to replace advice given to you by your health care provider. Make sure you discuss any questions you have  with your health care provider. °Document Released: 07/27/2007 Document Revised: 08/28/2015 Document Reviewed: 07/12/2015 °Elsevier Interactive Patient Education © 2017 [employer] ° °

## 2016-03-03 NOTE — MAU Note (Signed)
Is hurting.  Was here on Sun.  Has a mass on her ovary.   Pain is getting worser, preventing her from eating or sleeping.  Medication is not helping at all.  Can't keep nothing down now, started vomiting yesterday.  Has not been called be clinic yet.  (pt can not sit still)

## 2016-03-03 NOTE — MAU Note (Signed)
Seen in MAU on Sunday, coming back because pain is worse and having N&V; Has been taking Hydrocone for pain; hx of partial hysterectomy;

## 2016-03-04 LAB — URINE CULTURE: Special Requests: NORMAL

## 2016-03-07 ENCOUNTER — Ambulatory Visit: Payer: Medicaid Other | Admitting: Internal Medicine

## 2016-03-17 ENCOUNTER — Encounter: Payer: Self-pay | Admitting: Family Medicine

## 2016-03-17 ENCOUNTER — Ambulatory Visit (INDEPENDENT_AMBULATORY_CARE_PROVIDER_SITE_OTHER): Payer: BLUE CROSS/BLUE SHIELD | Admitting: Clinical

## 2016-03-17 ENCOUNTER — Ambulatory Visit (INDEPENDENT_AMBULATORY_CARE_PROVIDER_SITE_OTHER): Payer: BLUE CROSS/BLUE SHIELD | Admitting: Family Medicine

## 2016-03-17 VITALS — BP 126/85 | HR 99 | Ht 60.0 in | Wt 126.3 lb

## 2016-03-17 DIAGNOSIS — Z803 Family history of malignant neoplasm of breast: Secondary | ICD-10-CM | POA: Diagnosis not present

## 2016-03-17 DIAGNOSIS — Z1239 Encounter for other screening for malignant neoplasm of breast: Secondary | ICD-10-CM

## 2016-03-17 DIAGNOSIS — N83201 Unspecified ovarian cyst, right side: Secondary | ICD-10-CM

## 2016-03-17 DIAGNOSIS — R911 Solitary pulmonary nodule: Secondary | ICD-10-CM

## 2016-03-17 DIAGNOSIS — N83209 Unspecified ovarian cyst, unspecified side: Secondary | ICD-10-CM | POA: Insufficient documentation

## 2016-03-17 DIAGNOSIS — F4323 Adjustment disorder with mixed anxiety and depressed mood: Secondary | ICD-10-CM | POA: Diagnosis not present

## 2016-03-17 MED ORDER — HYDROCODONE-ACETAMINOPHEN 5-325 MG PO TABS: 1.0000 | ORAL_TABLET | Freq: Four times a day (QID) | ORAL | 0 refills | Status: DC | PRN

## 2016-03-17 NOTE — Assessment & Plan Note (Signed)
Lengthy discussion with patient about risks of ovary removal including menopause, risk of premature death as well as general risks with surgery given 5 prior laparotomies and one previous laparoscopy. General expectations around hemorrhagic cysts given. Will repeat u/s in 6-8 wks after last one to ensure resolution. Refilled pain meds, but use Ibuprofen more.

## 2016-03-17 NOTE — Progress Notes (Signed)
   Subjective:    Patient ID: Rachel Fischer is a 46 y.o. female presenting with Ovarian Cyst  on 03/17/2016  HPI: [id number] with prior C-S x 4, TAH and Laparoscopic LSO who was seen in early January with possibly hemorrhagic ovarian cyst. States her pain has continued. She is using Hydrocodone and Ibuprofen for pain. She is having painful intercourse. Reports that she would like to have her ovary removed. Had what appeared to be a hemorrhagic cyst and it is still causing her pain.  Review of Systems  Constitutional: Negative for chills and fever.  Respiratory: Negative for shortness of breath.   Cardiovascular: Negative for chest pain.  Gastrointestinal: Negative for abdominal pain, nausea and vomiting.  Genitourinary: Positive for pelvic pain. Negative for dysuria.  Skin: Negative for rash.      Objective:    BP 126/85   Pulse 99   Ht 5' (1.524 m)   Wt 126 lb 4.8 oz (57.3 kg)   BMI 24.67 kg/m  Physical Exam  Constitutional: She is oriented to person, place, and time. She appears well-developed and well-nourished. No distress.  HENT:  Head: Normocephalic and atraumatic.  Eyes: No scleral icterus.  Neck: Neck supple.  Cardiovascular: Normal rate.   Pulmonary/Chest: Effort normal.  Abdominal: Soft.  Genitourinary: Vagina normal.  Genitourinary Comments: Possible fullness on right, moderate tenderness  Neurological: She is alert and oriented to person, place, and time.  Skin: Skin is warm and dry.  Psychiatric: She has a normal mood and affect.        Assessment & Plan:   Problem List Items Addressed This Visit      Unprioritized   Pulmonary nodule   Ovarian cyst    Lengthy discussion with patient about risks of ovary removal including menopause, risk of premature death as well as general risks with surgery given 5 prior laparotomies and one previous laparoscopy. General expectations around hemorrhagic cysts given. Will repeat u/s in 6-8 wks after last one to ensure  resolution. Refilled pain meds, but use Ibuprofen more.      Relevant Medications   HYDROcodone-acetaminophen (NORCO/VICODIN) 5-325 MG tablet   Other Relevant Orders   US Transvaginal Non-OB   Family history of breast cancer   Relevant Orders   MM DIGITAL SCREENING BILATERAL    Other Visit Diagnoses    Screening for breast cancer    -  Primary   Relevant Orders   MM DIGITAL SCREENING BILATERAL      Total face-to-face time with patient: 30 minutes. Over 50% of encounter was spent on counseling and coordination of care. Return in about 2 months (around 05/15/2016).  Reva Boresanya S [place] 03/17/2016 1:52 PM

## 2016-03-17 NOTE — BH Specialist Note (Signed)
Session Start time: 2:25   End Time: 2:55 Total Time:  20 minutes Type of Service: Behavioral Health - Individual/Family Interpreter: No.   Interpreter Name & Language: n/a # [facility]-June [facility]  SUBJECTIVE: Rachel Fischer is a 46 y.o. female  Pt. was referred by Dr Shawnie PonsPratt for:  anxiety and depression. Pt. reports the following symptoms/concerns: Pt states that she feels depressed, and worries excessively; pt says she would cope best by talking aloud her feelings and would like to have a full weekend of relaxation to herself.  Duration of problem:  Increase in over two months Severity: moderately severe Previous treatment: none  OBJECTIVE: Mood: Anxious & Affect: Tearful Risk of harm to self or others: No known risk of harm to self or others Assessments administered: PHQ9: 17/ GAD7: 16  LIFE CONTEXT:  Family & Social: Lives with husband and 19yo son (Who,family proximity, relationship, friends) Product/process development scientistchool/ Work: Unemployed, wants to work (Where, how often, or financial support) Self-Care: Realizing need for self-care (Exercise, sleep, eat, substances) Life changes: Ongoing health issues What is important to pt/family (values): Self-care  GOALS ADDRESSED:  -Reduce symptoms of anxiety and depression  INTERVENTIONS: Meditation: CALM relaxation breathing exercise   ASSESSMENT:  Pt currently experiencing Adjustment disorder with mixed anxious and depressed mood.  Pt may benefit from psychoeducation and brief therapeutic interventions regarding coping with symptoms of anxiety and depression  PLAN: 1. F/U with behavioral health clinician: One month, or as needed 2. Behavioral Health meds: none 3. Behavioral recommendations:  -When taking iron, or eating iron-rich foods, pair with vitamin C(orange juice, etc) -Practice CALM relaxation breathing exercise every morning, with blood pressure medication; practice at bedtime nightly -Read educational material regarding coping  with symptoms of anxiety and depression -Consider [facility]'s classes for women  4. Referral: Brief Counseling/Psychotherapy, Publishing rights managerCommunity Resource and Psychoeducation 5. From scale of 1-10, how likely are you to follow plan: 8  Woc-Behavioral Health Clinician  Behavioral Health Clinician  Marlon PelWarmhandoff:   Warm Hand Off Completed.

## 2016-03-17 NOTE — Patient Instructions (Signed)
Ovarian Cyst °An ovarian cyst is a fluid-filled sac on an ovary. The ovaries are organs that make eggs in women. Most ovarian cysts go away on their own and are not cancerous (are benign). Some cysts need treatment. °Follow these instructions at home: °· Take over-the-counter and prescription medicines only as told by your doctor. °· Do not drive or use heavy machinery while taking prescription pain medicine. °· Get pelvic exams and Pap tests as often as told by your doctor. °· Return to your normal activities as told by your doctor. Ask your doctor what activities are safe for you. °· Do not use any products that contain nicotine or tobacco, such as cigarettes and e-cigarettes. If you need help quitting, ask your doctor. °· Keep all follow-up visits as told by your doctor. This is important. °Contact a doctor if: °· Your periods are: °¨ Late. °¨ Irregular. °¨ Painful. °· Your periods stop. °· You have pelvic pain that does not go away. °· You have pressure on your bladder. °· You have trouble making your bladder empty when you pee (urinate). °· You have pain during sex. °· You have any of the following in your belly (abdomen): °¨ A feeling of fullness. °¨ Pressure. °¨ Discomfort. °¨ Pain that does not go away. °¨ Swelling. °· You feel sick most of the time. °· You have trouble pooping (have constipation). °· You are not as hungry as usual (you lose your appetite). °· You get very bad acne. °· You start to have more hair on your body and face. °· You are gaining weight or losing weight without changing your exercise and eating habits. °· You think you may be pregnant. °Get help right away if: °· You have belly pain that is very bad or gets worse. °· You cannot eat or drink without throwing up (vomiting). °· You suddenly get a fever. °· Your period is a lot heavier than usual. °This information is not intended to replace advice given to you by your health care provider. Make sure you discuss any questions you have  with your health care provider. °Document Released: 07/27/2007 Document Revised: 08/28/2015 Document Reviewed: 07/12/2015 °Elsevier Interactive Patient Education © 2017 [employer] ° °

## 2016-03-17 NOTE — Progress Notes (Signed)
Pt has right sided pelvic pain, she is using oxycodone for pain. Was diagnosed with Ovarian Cyst in MAU. Pain is rated at 5 out of 10.

## 2016-03-31 ENCOUNTER — Ambulatory Visit
Admission: RE | Admit: 2016-03-31 | Discharge: 2016-03-31 | Disposition: A | Discharge status: Home / Self Care | Payer: BLUE CROSS/BLUE SHIELD | Source: Ambulatory Visit | Attending: Family Medicine | Admitting: Family Medicine

## 2016-03-31 DIAGNOSIS — Z803 Family history of malignant neoplasm of breast: Secondary | ICD-10-CM

## 2016-03-31 DIAGNOSIS — Z1239 Encounter for other screening for malignant neoplasm of breast: Secondary | ICD-10-CM

## 2016-04-01 ENCOUNTER — Other Ambulatory Visit: Payer: Self-pay | Admitting: Family Medicine

## 2016-04-01 DIAGNOSIS — N63 Unspecified lump in unspecified breast: Secondary | ICD-10-CM

## 2016-04-08 ENCOUNTER — Ambulatory Visit
Admission: RE | Admit: 2016-04-08 | Discharge: 2016-04-08 | Disposition: A | Discharge status: Home / Self Care | Payer: BLUE CROSS/BLUE SHIELD | Source: Ambulatory Visit | Attending: Family Medicine | Admitting: Family Medicine

## 2016-04-08 DIAGNOSIS — N63 Unspecified lump in unspecified breast: Secondary | ICD-10-CM

## 2016-04-12 ENCOUNTER — Ambulatory Visit (HOSPITAL_COMMUNITY)
Admission: RE | Admit: 2016-04-12 | Discharge: 2016-04-12 | Disposition: A | Discharge status: Home / Self Care | Payer: BLUE CROSS/BLUE SHIELD | Source: Ambulatory Visit | Attending: Family Medicine | Admitting: Family Medicine

## 2016-04-12 DIAGNOSIS — N83201 Unspecified ovarian cyst, right side: Secondary | ICD-10-CM | POA: Insufficient documentation

## 2016-04-12 DIAGNOSIS — Z90710 Acquired absence of both cervix and uterus: Secondary | ICD-10-CM | POA: Insufficient documentation

## 2016-04-15 ENCOUNTER — Telehealth: Payer: Self-pay

## 2016-04-15 DIAGNOSIS — N83201 Unspecified ovarian cyst, right side: Secondary | ICD-10-CM

## 2016-04-15 NOTE — Telephone Encounter (Signed)
Pt called requesting US results.  

## 2016-04-18 NOTE — Telephone Encounter (Signed)
After review of u/s result, inboxed Dr Shawnie PonsPratt for guidance regarding f/u. Will call patient when I hear back.

## 2016-04-19 ENCOUNTER — Encounter (HOSPITAL_COMMUNITY): Payer: Self-pay | Admitting: Emergency Medicine

## 2016-04-19 ENCOUNTER — Emergency Department (HOSPITAL_COMMUNITY)
Admission: EM | Admit: 2016-04-19 | Discharge: 2016-04-19 | Disposition: A | Discharge status: Home / Self Care | Payer: BLUE CROSS/BLUE SHIELD | Attending: Emergency Medicine | Admitting: Emergency Medicine

## 2016-04-19 ENCOUNTER — Emergency Department (HOSPITAL_COMMUNITY): Payer: BLUE CROSS/BLUE SHIELD

## 2016-04-19 DIAGNOSIS — I10 Essential (primary) hypertension: Secondary | ICD-10-CM | POA: Diagnosis not present

## 2016-04-19 DIAGNOSIS — R112 Nausea with vomiting, unspecified: Secondary | ICD-10-CM | POA: Insufficient documentation

## 2016-04-19 DIAGNOSIS — M436 Torticollis: Secondary | ICD-10-CM | POA: Diagnosis not present

## 2016-04-19 DIAGNOSIS — Z79899 Other long term (current) drug therapy: Secondary | ICD-10-CM | POA: Insufficient documentation

## 2016-04-19 DIAGNOSIS — F17210 Nicotine dependence, cigarettes, uncomplicated: Secondary | ICD-10-CM | POA: Diagnosis not present

## 2016-04-19 DIAGNOSIS — R6889 Other general symptoms and signs: Secondary | ICD-10-CM

## 2016-04-19 DIAGNOSIS — R0602 Shortness of breath: Secondary | ICD-10-CM | POA: Diagnosis not present

## 2016-04-19 LAB — COMPREHENSIVE METABOLIC PANEL
ALK PHOS: 43 U/L (ref 38–126)
ALT: 16 U/L (ref 14–54)
AST: 26 U/L (ref 15–41)
Albumin: 4 g/dL (ref 3.5–5.0)
Anion gap: 13 (ref 5–15)
BILIRUBIN TOTAL: 0.7 mg/dL (ref 0.3–1.2)
BUN: 5 mg/dL — AB (ref 6–20)
CALCIUM: 9.4 mg/dL (ref 8.9–10.3)
CO2: 19 mmol/L — ABNORMAL LOW (ref 22–32)
CREATININE: 0.67 mg/dL (ref 0.44–1.00)
Chloride: 105 mmol/L (ref 101–111)
Glucose, Bld: 138 mg/dL — ABNORMAL HIGH (ref 65–99)
Potassium: 3.4 mmol/L — ABNORMAL LOW (ref 3.5–5.1)
Sodium: 137 mmol/L (ref 135–145)
TOTAL PROTEIN: 6.9 g/dL (ref 6.5–8.1)

## 2016-04-19 LAB — CBC
HEMATOCRIT: 38.5 % (ref 36.0–46.0)
Hemoglobin: 13.2 g/dL (ref 12.0–15.0)
MCH: 28.6 pg (ref 26.0–34.0)
MCHC: 34.3 g/dL (ref 30.0–36.0)
MCV: 83.5 fL (ref 78.0–100.0)
PLATELETS: 251 10*3/uL (ref 150–400)
RBC: 4.61 MIL/uL (ref 3.87–5.11)
RDW: 13.7 % (ref 11.5–15.5)
WBC: 4.4 10*3/uL (ref 4.0–10.5)

## 2016-04-19 LAB — I-STAT TROPONIN, ED: TROPONIN I, POC: 0 ng/mL (ref 0.00–0.08)

## 2016-04-19 MED ORDER — ONDANSETRON HCL 8 MG PO TABS: 8.0000 mg | ORAL_TABLET | Freq: Three times a day (TID) | ORAL | 0 refills | Status: DC | PRN

## 2016-04-19 MED ORDER — METHOCARBAMOL 500 MG PO TABS: 500.0000 mg | ORAL_TABLET | Freq: Four times a day (QID) | ORAL | 0 refills | Status: DC

## 2016-04-19 MED ORDER — BENZONATATE 100 MG PO CAPS: 200.0000 mg | ORAL_CAPSULE | Freq: Three times a day (TID) | ORAL | 0 refills | Status: DC | PRN

## 2016-04-19 MED ORDER — DICLOFENAC SODIUM 75 MG PO TBEC: 75.0000 mg | DELAYED_RELEASE_TABLET | Freq: Two times a day (BID) | ORAL | 0 refills | Status: DC

## 2016-04-19 MED ORDER — KETOROLAC TROMETHAMINE 60 MG/2ML IM SOLN
60.0000 mg | Freq: Once | INTRAMUSCULAR | Status: AC
  Administered 2016-04-19: 60 mg via INTRAMUSCULAR
  Filled 2016-04-19: qty 2

## 2016-04-19 MED ORDER — LOPERAMIDE HCL 2 MG PO CAPS: 2.0000 mg | ORAL_CAPSULE | Freq: Four times a day (QID) | ORAL | 0 refills | Status: DC | PRN

## 2016-04-19 MED ORDER — BENZONATATE 100 MG PO CAPS
200.0000 mg | ORAL_CAPSULE | Freq: Once | ORAL | Status: AC
  Administered 2016-04-19: 200 mg via ORAL
  Filled 2016-04-19: qty 2

## 2016-04-19 MED ORDER — METHOCARBAMOL 500 MG PO TABS
500.0000 mg | ORAL_TABLET | Freq: Once | ORAL | Status: AC
  Administered 2016-04-19: 500 mg via ORAL
  Filled 2016-04-19: qty 1

## 2016-04-19 NOTE — ED Provider Notes (Signed)
[facility]-EMERGENCY DEPT Provider Note   CSN: [id number] Arrival date & time: 04/19/16  1250     History   Chief Complaint Chief Complaint  Patient presents with  . Arm Pain  . Shortness of Breath    HPI Rachel Fischer is a 46 y.o. female with a history of htn and anemia presenting with a 2 day history of flu like symptoms including nonproductive cough, shortness of breath, nausea with emesis x 1 yesterday and several episodes of diarrhea woke this am with left sided neck, shoulder and left upper chest numbness which radiates into her left hand.  She has increased pain in the neck and shoulder with leftward rotation. She denies weakness in her extremities, denies dizziness or headache.   She denies abdominal pain or fevers. She has taken no medicines for her symptoms prior to arrival.  The history is provided by the patient.    Past Medical History:  Diagnosis Date  . Anemia   . Hypertension   . Lung nodule   . Migraine headache   . Tobacco use     Patient Active Problem List   Diagnosis Date Noted  . Ovarian cyst 03/17/2016  . Family history of breast cancer 03/17/2016  . Blurred vision   . Cephalalgia   . Vision changes   . Secondary hypertension, unspecified   . Headache 08/06/2015  . Pulmonary nodule 06/07/2011    Past Surgical History:  Procedure Laterality Date  . ABDOMINAL HYSTERECTOMY    . c section x 4    . CESAREAN SECTION    . FOOT SURGERY      OB History    Gravida Para Term Preterm AB Living   4 4 4     4    SAB TAB Ectopic Multiple Live Births           4       Home Medications    Prior to Admission medications   Medication Sig Start Date End Date Taking? Authorizing Provider  diclofenac (VOLTAREN) 75 MG EC tablet Take 1 tablet (75 mg total) by mouth 2 (two) times daily. 04/19/16   Burgess Amor, PA-C  HYDROcodone-acetaminophen (NORCO/VICODIN) 5-325 MG tablet Take 1 tablet by mouth every 6 (six) hours as needed for moderate pain. 03/17/16   Reva Bores, MD  lisinopril-hydrochlorothiazide (PRINZIDE,ZESTORETIC) 10-12.5 MG tablet Take 1 tablet by mouth daily. 02/28/16 03/29/16  Currie Paris, NP  loperamide (IMODIUM) 2 MG capsule Take 1 capsule (2 mg total) by mouth 4 (four) times daily as needed for diarrhea or loose stools. 04/19/16   Burgess Amor, PA-C  methocarbamol (ROBAXIN) 500 MG tablet Take 1 tablet (500 mg total) by mouth 4 (four) times daily. 04/19/16   Burgess Amor, PA-C  ondansetron (ZOFRAN) 8 MG tablet Take 1 tablet (8 mg total) by mouth every 8 (eight) hours as needed for nausea or vomiting. 04/19/16   Burgess Amor, PA-C    Family History Family History  Problem Relation Age of Onset  . Breast cancer Mother 29  . Hypertension Mother   . Hyperlipidemia Mother   . CAD Mother     Social History Social History  Substance Use Topics  . Smoking status: Current Every Day Smoker    Packs/day: 0.50    Years: 19.00    Types: Cigarettes  . Smokeless tobacco: Never Used  . Alcohol use No     Allergies   Ciprofloxacin; Morphine and related; and Penicillins   Review of Systems Review  of Systems  Constitutional: Positive for appetite change.  HENT: Negative for congestion.   Eyes: Negative.   Respiratory: Positive for cough and shortness of breath. Negative for chest tightness.   Gastrointestinal: Positive for diarrhea, nausea and vomiting.  Genitourinary: Negative.   Musculoskeletal: Positive for neck pain. Negative for arthralgias and joint swelling.  Skin: Negative.  Negative for wound.  Neurological: Negative for dizziness, weakness, light-headedness and numbness.  Psychiatric/Behavioral: Negative.      Physical Exam Updated Vital Signs BP 161/98   Pulse 98   Temp 98.4 F (36.9 C) (Oral)   Resp 24   SpO2 100%   Physical Exam  Constitutional: She appears well-developed and well-nourished. No distress.  HENT:  Head: Normocephalic and atraumatic.  Eyes: Conjunctivae are normal.  Neck: Normal range of motion.    Cardiovascular: Normal rate, regular rhythm, normal heart sounds and intact distal pulses.   Pulmonary/Chest: Effort normal and breath sounds normal. She has no wheezes.  Abdominal: Soft. Bowel sounds are normal. She exhibits no distension and no mass. There is no tenderness. There is no guarding.  Musculoskeletal: Normal range of motion.       Cervical back: She exhibits tenderness and spasm.       Back:  ttp left paracervical musculature, no midline ttp.   Neurological: She is alert. No sensory deficit.  Reflex Scores:      Bicep reflexes are 2+ on the right side and 2+ on the left side. Equal grip strength.  Skin: Skin is warm and dry.  Psychiatric: She has a normal mood and affect.  Nursing note and vitals reviewed.    ED Treatments / Results  Labs (all labs ordered are listed, but only abnormal results are displayed)  Results for orders placed or performed during the hospital encounter of 04/19/16  CBC  Result Value Ref Range   WBC 4.4 4.0 - 10.5 K/uL   RBC 4.61 3.87 - 5.11 MIL/uL   Hemoglobin 13.2 12.0 - 15.0 g/dL   HCT 91.438.5 78.236.0 - 95.646.0 %   MCV 83.5 78.0 - 100.0 fL   MCH 28.6 26.0 - 34.0 pg   MCHC 34.3 30.0 - 36.0 g/dL   RDW 21.313.7 08.611.5 - 57.815.5 %   Platelets 251 150 - 400 K/uL  Comprehensive metabolic panel  Result Value Ref Range   Sodium 137 135 - 145 mmol/L   Potassium 3.4 (L) 3.5 - 5.1 mmol/L   Chloride 105 101 - 111 mmol/L   CO2 19 (L) 22 - 32 mmol/L   Glucose, Bld 138 (H) 65 - 99 mg/dL   BUN 5 (L) 6 - 20 mg/dL   Creatinine, Ser 4.690.67 0.44 - 1.00 mg/dL   Calcium 9.4 8.9 - 62.910.3 mg/dL   Total Protein 6.9 6.5 - 8.1 g/dL   Albumin 4.0 3.5 - 5.0 g/dL   AST 26 15 - 41 U/L   ALT 16 14 - 54 U/L   Alkaline Phosphatase 43 38 - 126 U/L   Total Bilirubin 0.7 0.3 - 1.2 mg/dL   GFR calc non Af Amer >60 >60 mL/min   GFR calc Af Amer >60 >60 mL/min   Anion gap 13 5 - 15  [facility]-stat troponin[facility]  Result Value Ref Range   Troponin i, poc 0.00 0.00 - 0.08  ng/mL   Comment 3              EKG  EKG Interpretation  Date/Time:  Tuesday April 19 2016 13:08:36  EST Ventricular Rate:  99 PR Interval:  156 QRS Duration: 64 QT Interval:  360 QTC Calculation: 462 R Axis:   53 Text Interpretation:  Normal sinus rhythm Anteroseptal infarct , age undetermined Abnormal ECG No significant change was found Confirmed by CAMPOS  MD, Caryn Bee [phone]) on 04/19/2016 4:27:39 PM       Radiology Dg Chest 2 View  Result Date: 04/19/2016 CLINICAL DATA:  Two days of left-sided shoulder, body, and leg pain associated with shortness of breath. Current smoker. History of hypertension EXAM: CHEST  2 VIEW COMPARISON:  Chest x-ray of October 14, 2015 FINDINGS: The lungs are mildly hyperinflated but clear. The heart and pulmonary vascularity are normal. The mediastinum is normal in width. There is no pleural effusion or pneumothorax. The bony thorax exhibits no acute abnormality. IMPRESSION: Mild hyperinflation may be voluntary or may reflect reactive airway disease or COPD. There is no pneumonia, CHF, nor other acute cardiopulmonary disease. Electronically Signed   By: David  [place] M.D.   On: 04/19/2016 13:52    Procedures Procedures (including critical care time)  Medications Ordered in ED Medications  methocarbamol (ROBAXIN) tablet 500 mg (500 mg Oral Given 04/19/16 1621)  ketorolac (TORADOL) injection 60 mg (60 mg Intramuscular Given 04/19/16 1621)     Initial Impression / Assessment and Plan / ED Course  I have reviewed the triage vital signs and the nursing notes.  Pertinent labs & imaging results that were available during my care of the patient were reviewed by me and considered in my medical decision making (see chart for details).     Pt no risk for PE, Wells criteria negative. History suggesting viral syndrome, with vomiting, diarrhea, cough sx.  Left chest and shoulder pain reproducible and reassuring for musculoskeletal source.  Labs stable, ekg  normal.  cxr reviewed.  Zofran, robaxin, ibuprofen, heat tx to neck and shoulder.    Final Clinical Impressions(s) / ED Diagnoses   Final diagnoses:  Flu-like symptoms  Torticollis, acute    New Prescriptions New Prescriptions   DICLOFENAC (VOLTAREN) 75 MG EC TABLET    Take 1 tablet (75 mg total) by mouth 2 (two) times daily.   LOPERAMIDE (IMODIUM) 2 MG CAPSULE    Take 1 capsule (2 mg total) by mouth 4 (four) times daily as needed for diarrhea or loose stools.   METHOCARBAMOL (ROBAXIN) 500 MG TABLET    Take 1 tablet (500 mg total) by mouth 4 (four) times daily.   ONDANSETRON (ZOFRAN) 8 MG TABLET    Take 1 tablet (8 mg total) by mouth every 8 (eight) hours as needed for nausea or vomiting.     Burgess Amor, PA-C 04/19/16 1631    Azalia Bilis, MD 04/20/16 [phone]

## 2016-04-19 NOTE — ED Triage Notes (Addendum)
Pt reports left arm pain that radiates into chest, vomiting and sob that began yesterday morning. Respirations labored. Pt crying in triage. Pt a/ox4.

## 2016-04-19 NOTE — Discharge Instructions (Addendum)
Rest and make sure you are drinking plenty of fluids.  Use the medicines prescribed if your nausea and diarrhea persist.  You have also been prescribed a muscle relaxer and an anti inflammatory pain reliever for your neck and shoulder pain and spasm. Apply a heating pad as discussed for 20 minutes several times daily.

## 2016-04-19 NOTE — ED Notes (Signed)
EDP made aware of pt requesting pain medication 

## 2016-04-28 NOTE — Telephone Encounter (Addendum)
Called pt and informed her that her US result has been reviewed by Dr. Shawnie PonsPratt. It appears that the cyst is resolving. Dr. Shawnie PonsPratt recommends repeat US in 3 months to confirm complete resolution of the cyst.  Pt voiced understanding and agreed to US appt on 5/16 @ 0900

## 2016-07-06 ENCOUNTER — Ambulatory Visit (HOSPITAL_COMMUNITY): Payer: BLUE CROSS/BLUE SHIELD

## 2016-07-06 ENCOUNTER — Ambulatory Visit (HOSPITAL_COMMUNITY): Payer: BLUE CROSS/BLUE SHIELD | Attending: Family Medicine

## 2017-01-09 ENCOUNTER — Encounter (HOSPITAL_COMMUNITY): Payer: Self-pay

## 2017-01-09 ENCOUNTER — Inpatient Hospital Stay (HOSPITAL_COMMUNITY)
Admission: AD | Admit: 2017-01-09 | Discharge: 2017-01-09 | Disposition: A | Discharge status: Home / Self Care | Payer: BLUE CROSS/BLUE SHIELD | Source: Ambulatory Visit | Attending: Obstetrics and Gynecology | Admitting: Obstetrics and Gynecology

## 2017-01-09 ENCOUNTER — Other Ambulatory Visit: Payer: Self-pay

## 2017-01-09 DIAGNOSIS — N760 Acute vaginitis: Secondary | ICD-10-CM | POA: Diagnosis not present

## 2017-01-09 DIAGNOSIS — Z885 Allergy status to narcotic agent status: Secondary | ICD-10-CM | POA: Diagnosis not present

## 2017-01-09 DIAGNOSIS — B373 Candidiasis of vulva and vagina: Secondary | ICD-10-CM | POA: Diagnosis not present

## 2017-01-09 DIAGNOSIS — I10 Essential (primary) hypertension: Secondary | ICD-10-CM | POA: Diagnosis not present

## 2017-01-09 DIAGNOSIS — Z888 Allergy status to other drugs, medicaments and biological substances status: Secondary | ICD-10-CM | POA: Insufficient documentation

## 2017-01-09 DIAGNOSIS — Z803 Family history of malignant neoplasm of breast: Secondary | ICD-10-CM | POA: Diagnosis not present

## 2017-01-09 DIAGNOSIS — Z880 Allergy status to penicillin: Secondary | ICD-10-CM | POA: Insufficient documentation

## 2017-01-09 DIAGNOSIS — Z79891 Long term (current) use of opiate analgesic: Secondary | ICD-10-CM | POA: Insufficient documentation

## 2017-01-09 DIAGNOSIS — Z9889 Other specified postprocedural states: Secondary | ICD-10-CM | POA: Diagnosis not present

## 2017-01-09 DIAGNOSIS — Z8249 Family history of ischemic heart disease and other diseases of the circulatory system: Secondary | ICD-10-CM | POA: Diagnosis not present

## 2017-01-09 DIAGNOSIS — Z90710 Acquired absence of both cervix and uterus: Secondary | ICD-10-CM | POA: Insufficient documentation

## 2017-01-09 DIAGNOSIS — F17210 Nicotine dependence, cigarettes, uncomplicated: Secondary | ICD-10-CM | POA: Diagnosis not present

## 2017-01-09 DIAGNOSIS — Z79899 Other long term (current) drug therapy: Secondary | ICD-10-CM | POA: Diagnosis not present

## 2017-01-09 DIAGNOSIS — F329 Major depressive disorder, single episode, unspecified: Secondary | ICD-10-CM | POA: Diagnosis not present

## 2017-01-09 DIAGNOSIS — B3731 Acute candidiasis of vulva and vagina: Secondary | ICD-10-CM

## 2017-01-09 DIAGNOSIS — N750 Cyst of Bartholin's gland: Secondary | ICD-10-CM | POA: Insufficient documentation

## 2017-01-09 DIAGNOSIS — B9689 Other specified bacterial agents as the cause of diseases classified elsewhere: Secondary | ICD-10-CM

## 2017-01-09 HISTORY — DX: Personal history of other infectious and parasitic diseases: Z86.19

## 2017-01-09 HISTORY — DX: Depression, unspecified: F32.A

## 2017-01-09 HISTORY — DX: Major depressive disorder, single episode, unspecified: F32.9

## 2017-01-09 LAB — URINALYSIS, ROUTINE W REFLEX MICROSCOPIC
Bilirubin Urine: NEGATIVE
GLUCOSE, UA: NEGATIVE mg/dL
Ketones, ur: NEGATIVE mg/dL
Leukocytes, UA: NEGATIVE
Nitrite: POSITIVE — AB
PROTEIN: NEGATIVE mg/dL
SPECIFIC GRAVITY, URINE: 1.01 (ref 1.005–1.030)
pH: 7 (ref 5.0–8.0)

## 2017-01-09 LAB — WET PREP, GENITAL
SPERM: NONE SEEN
Trich, Wet Prep: NONE SEEN

## 2017-01-09 MED ORDER — METRONIDAZOLE 500 MG PO TABS: 500.0000 mg | ORAL_TABLET | Freq: Two times a day (BID) | ORAL | 0 refills | Status: DC

## 2017-01-09 MED ORDER — METRONIDAZOLE 0.75 % VA GEL: 1.0000 | Freq: Two times a day (BID) | VAGINAL | 0 refills | Status: DC

## 2017-01-09 MED ORDER — FLUCONAZOLE 150 MG PO TABS: 150.0000 mg | ORAL_TABLET | Freq: Every day | ORAL | 1 refills | Status: DC

## 2017-01-09 NOTE — Progress Notes (Signed)
D/c instructions discussed & medications reviewed.  Pt given instructions & signed.  D/c in stable condition.

## 2017-01-09 NOTE — MAU Note (Signed)
Pt states noticed tender knot approx dime sized in right groin x 2days ago.  When pressing on it or walking she notice pains shoot down right thigh that began this am.   She also has a non tender hardness on right labia that she noticed x 4days that appears to have gotten larger since then.

## 2017-01-09 NOTE — MAU Note (Signed)
Pt presents with knot in  right groin.  States it went away and now has returned and it's getting bigger.  Pt reports it's causing pain that's shooting down right leg.

## 2017-01-09 NOTE — MAU Provider Note (Signed)
History     CSN: [id number]  Arrival date and time: 01/09/17 [id number]   First Provider Initiated Contact with Patient 01/09/17 [phone]      Chief Complaint  Patient presents with  . Knot in Groin   HPI  Rachel Fischer is a 46 y.o. non pregnant female who presents with groin pain. Symptoms began 4 days ago. Feels a hard painful lump in her right groin. Also noted a bump on her right labia that appeared at the same time. No drainage from lumps & has not treated. Denies abdominal pain, fever/chills, vaginal discharge, vaginal bleeding, or dysuria. Patient is sexually active with 1 new partner x 1 month & does not use condoms. States her ex boyfriend called her last month & told her "she needed to be checked" but didn't specify why or what for.  Pt with history of chronic hypertension. Has not been on meds for over a year b/c she has been unable to find a PCP. States her normal BP is 160s/90. Denies headache, chest pain, SOB, or s/s stroke.   Past Medical History:  Diagnosis Date  . Anemia   . Depression    has not been diagnosed  . Hx of trichomoniasis   . Hypertension    not currently taking meds due to financial reasons  . Lung nodule   . Migraine headache    migraines; gets them 4/month  . Tobacco use     Past Surgical History:  Procedure Laterality Date  . ABDOMINAL HYSTERECTOMY    . ARM NEUROPLASTY Left   . c section x 4    . CESAREAN SECTION    . FOOT SURGERY      Family History  Problem Relation Age of Onset  . Breast cancer Mother 255  . Hypertension Mother   . Hyperlipidemia Mother   . CAD Mother     Social History   Tobacco Use  . Smoking status: Current Every Day Smoker    Packs/day: 0.50    Years: 19.00    Pack years: 9.50    Types: Cigarettes  . Smokeless tobacco: Never Used  Substance Use Topics  . Alcohol use: No  . Drug use: Yes    Types: Marijuana    Allergies:  Allergies  Allergen Reactions  . Ciprofloxacin Other (See Comments)     Generalized muscle and joint pain.  Marland Kitchen. Morphine And Related Itching and Nausea And Vomiting  . Penicillins Hives    Has patient had a PCN reaction causing immediate rash, facial/tongue/throat swelling, SOB or lightheadedness with hypotension: No Has patient had a PCN reaction causing severe rash involving mucus membranes or skin necrosis: No Has patient had a PCN reaction that required hospitalization No Has patient had a PCN reaction occurring within the last 10 years: No If all of the above answers are "NO", then may proceed with Cephalosporin use.     Medications Prior to Admission  Medication Sig Dispense Refill Last Dose  . benzonatate (TESSALON) 100 MG capsule Take 2 capsules (200 mg total) by mouth 3 (three) times daily as needed for cough. 21 capsule 0   . diclofenac (VOLTAREN) 75 MG EC tablet Take 1 tablet (75 mg total) by mouth 2 (two) times daily. 20 tablet 0   . HYDROcodone-acetaminophen (NORCO/VICODIN) 5-325 MG tablet Take 1 tablet by mouth every 6 (six) hours as needed for moderate pain. 20 tablet 0   . lisinopril-hydrochlorothiazide (PRINZIDE,ZESTORETIC) 10-12.5 MG tablet Take 1 tablet by mouth daily. 30 tablet  0 Taking  . loperamide (IMODIUM) 2 MG capsule Take 1 capsule (2 mg total) by mouth 4 (four) times daily as needed for diarrhea or loose stools. 12 capsule 0   . methocarbamol (ROBAXIN) 500 MG tablet Take 1 tablet (500 mg total) by mouth 4 (four) times daily. 20 tablet 0   . ondansetron (ZOFRAN) 8 MG tablet Take 1 tablet (8 mg total) by mouth every 8 (eight) hours as needed for nausea or vomiting. 15 tablet 0     Review of Systems  Constitutional: Negative.   Respiratory: Negative for shortness of breath.   Cardiovascular: Negative for chest pain.  Gastrointestinal: Negative.   Genitourinary: Positive for vaginal pain. Negative for dyspareunia, dysuria, frequency, genital sores, vaginal bleeding and vaginal discharge.  Musculoskeletal: Negative for myalgias.   Neurological: Negative for headaches.   Physical Exam   Blood pressure (!) 159/96, pulse 91, temperature 98.4 F (36.9 C), temperature source Oral, resp. rate 18, height 5' (1.524 m), weight 131 lb (59.4 kg), SpO2 99 %.  Physical Exam  Nursing note and vitals reviewed. Constitutional: She is oriented to person, place, and time. She appears well-developed and well-nourished. No distress.  HENT:  Head: Normocephalic and atraumatic.  Eyes: Conjunctivae are normal. Right eye exhibits no discharge. Left eye exhibits no discharge. No scleral icterus.  Neck: Normal range of motion.  Cardiovascular: Normal rate, regular rhythm and normal heart sounds.  No murmur heard. Respiratory: Effort normal and breath sounds normal. No respiratory distress. She has no wheezes.  GI: Soft. She exhibits no distension. There is no tenderness. There is no rebound.  Genitourinary:  Genitourinary Comments: Enlarge right sided Bartholin's gland. Firm. ~1.5 cm. No drainage.   Lymphadenopathy:       Right: Inguinal (small, firm, mobile, tender) adenopathy present.  Neurological: She is alert and oriented to person, place, and time.  Skin: Skin is warm and dry. She is not diaphoretic.  Psychiatric: She has a normal mood and affect. Her behavior is normal. Judgment and thought content normal.    MAU Course  Procedures Results for orders placed or performed during the hospital encounter of 01/09/17 (from the past 24 hour(s))  Urinalysis, Routine w reflex microscopic     Status: Abnormal   Collection Time: 01/09/17  7:53 AM  Result Value Ref Range   Color, Urine YELLOW YELLOW   APPearance HAZY (A) CLEAR   Specific Gravity, Urine 1.010 1.005 - 1.030   pH 7.0 5.0 - 8.0   Glucose, UA NEGATIVE NEGATIVE mg/dL   Hgb urine dipstick SMALL (A) NEGATIVE   Bilirubin Urine NEGATIVE NEGATIVE   Ketones, ur NEGATIVE NEGATIVE mg/dL   Protein, ur NEGATIVE NEGATIVE mg/dL   Nitrite POSITIVE (A) NEGATIVE   Leukocytes, UA  NEGATIVE NEGATIVE   RBC / HPF 0-5 0 - 5 RBC/hpf   WBC, UA 0-5 0 - 5 WBC/hpf   Bacteria, UA MANY (A) NONE SEEN   Squamous Epithelial / LPF 0-5 (A) NONE SEEN   Mucus PRESENT   Wet prep, genital     Status: Abnormal   Collection Time: 01/09/17  8:44 AM  Result Value Ref Range   Yeast Wet Prep HPF POC PRESENT (A) NONE SEEN   Trich, Wet Prep NONE SEEN NONE SEEN   Clue Cells Wet Prep HPF POC PRESENT (A) NONE SEEN   WBC, Wet Prep HPF POC MODERATE (A) NONE SEEN   Sperm NONE SEEN     MDM GC/CT & wet prep collected Pt declines lab draw  Initial BP severely elevated. Pt asymptomatic. Per review of records, pt baseline 160s/80s. Repeat BP improved.  C/w Dr. Vergie LivingPickens. Ok to discharge home.  Assessment and Plan  A: 1. BV (bacterial vaginosis)   2. Vaginal yeast infection   3. Chronic hypertension   4. Bartholin's cyst    P: Discharge home Rx metrogel & diflucan Warm compresses for bartholins -- discussed reasons to return for I&D Stressed importance of establishing with PCP ASAP Discussed reasons to go to ED including CP, h/a, stroke, etc. GC/CT pending   Judeth Hornrin Armel Rabbani 01/09/2017, 8:37 AM

## 2017-01-09 NOTE — Discharge Instructions (Signed)
No Primary Care Doctor:  To locate a primary care doctor that accepts your insurance or provides certain services:           [place] [facility]: [phone]           Physician Referral Service: [phone] ask for My [place]  If no insurance, you need to see if you qualify for [facility] orange card, call to set      up appointment for eligibility/enrollment at [phone] or [phone] or visit [facility] and [employer] ([address], [place] and [address]) to meet with a [facility] enrollment specialist.      In late 2019, the [facility]. At that time, the MAU (Maternity Admissions Unit), where you are being seen today, will no longer see non-pregnant patients. We strongly encourage you to find a doctor's office before that time, so that you can be seen with any GYN concerns, like vaginal discharge, urinary tract infection, etc.. in a timely manner.   In order to make the office visit more convenient, the Center for [facility] at [facility] will be offering evening hours from 4pm-7:30pm on Monday. There will be same-day appointments, walk-in appointments and scheduled appointments available during this time. We will be adding more evening hours over the next year before the move.   Center for [facility] @ [facility] [phone]- [phone]  For urgent needs, [facility] is also available for management of urgent GYN complaints such as vaginal discharge or urinary tract infections.         Bartholin Cyst or Abscess A Bartholin cyst is a fluid-filled sac that forms on a Bartholin gland. Bartholin glands are small glands that are found in the folds of skin (labia) on the sides of the lower opening of the vagina. This type of cyst causes a bulge on the side of the vagina. A cyst that is not large or infected may not cause problems. However, if the fluid in the cyst  becomes infected, the cyst can turn into an abscess. An abscess may cause discomfort or pain. Follow these instructions at home:  Take medicines only as told by your doctor.  If you were prescribed an antibiotic medicine, finish all of it even if you start to feel better.  Apply warm, wet compresses to the area or take warm, shallow baths that cover your pelvic area (sitz baths). Do this many times each day or as told by your doctor.  Do not squeeze the cyst. Do not apply heavy pressure to it.  Do not have sex until the cyst has gone away.  If your cyst or abscess was opened by your doctor, a small piece of gauze or a drain may have been placed in the area. That lets the cyst drain. Do not remove the gauze or the drain until your doctor tells you it is okay to do that.  Do not wear tampons. Wear feminine pads as needed for any fluid or blood.  Keep all follow-up visits as told by your doctor. This is important. Contact a doctor if:  Your pain, puffiness (swelling), or redness in the area of the cyst gets worse.  You have fluid or pus pus coming from the cyst.  You have a fever. This information is not intended to replace advice given to you by your health care provider. Make sure you discuss any questions you have with your  health care provider. Document Released: 05/06/2008 Document Revised: 07/16/2015 Document Reviewed: 09/23/2013 Elsevier Interactive Patient Education  2018 Elsevier Inc.   Bacterial Vaginosis Bacterial vaginosis is an infection of the vagina. It happens when too many germs (bacteria) grow in the vagina. This infection puts you at risk for infections from sex (STIs). Treating this infection can lower your risk for some STIs. You should also treat this if you are pregnant. It can cause your baby to be born early. Follow these instructions at home: Medicines  Take over-the-counter and prescription medicines only as told by your doctor.  Take or use your  antibiotic medicine as told by your doctor. Do not stop taking or using it even if you start to feel better. General instructions  If you your sexual partner is a woman, tell her that you have this infection. She needs to get treatment if she has symptoms. If you have a female partner, he does not need to be treated.  During treatment: ? Avoid sex. ? Do not douche. ? Avoid alcohol as told. ? Avoid breastfeeding as told.  Drink enough fluid to keep your pee (urine) clear or pale yellow.  Keep your vagina and butt (rectum) clean. ? Wash the area with warm water every day. ? Wipe from front to back after you use the toilet.  Keep all follow-up visits as told by your doctor. This is important. Preventing this condition  Do not douche.  Use only warm water to wash around your vagina.  Use protection when you have sex. This includes: ? Latex condoms. ? Dental dams.  Limit how many people you have sex with. It is best to only have sex with the same person (be monogamous).  Get tested for STIs. Have your partner get tested.  Wear underwear that is cotton or lined with cotton.  Avoid tight pants and pantyhose. This is most important in summer.  Do not use any products that have nicotine or tobacco in them. These include cigarettes and e-cigarettes. If you need help quitting, ask your doctor.  Do not use illegal drugs.  Limit how much alcohol you drink. Contact a doctor if:  Your symptoms do not get better, even after you are treated.  You have more discharge or pain when you pee (urinate).  You have a fever.  You have pain in your belly (abdomen).  You have pain with sex.  Your bleed from your vagina between periods. Summary  This infection happens when too many germs (bacteria) grow in the vagina.  Treating this condition can lower your risk for some infections from sex (STIs).  You should also treat this if you are pregnant. It can cause early (premature)  birth.  Do not stop taking or using your antibiotic medicine even if you start to feel better. This information is not intended to replace advice given to you by your health care provider. Make sure you discuss any questions you have with your health care provider. Document Released: 11/17/2007 Document Revised: 10/24/2015 Document Reviewed: 10/24/2015 Elsevier Interactive Patient Education  2017 Elsevier Inc.  Hypertension Hypertension is another name for high blood pressure. High blood pressure forces your heart to work harder to pump blood. This can cause problems over time. There are two numbers in a blood pressure reading. There is a top number (systolic) over a bottom number (diastolic). It is best to have a blood pressure below 120/80. Healthy choices can help lower your blood pressure. You may need medicine to help  lower your blood pressure if:  Your blood pressure cannot be lowered with healthy choices.  Your blood pressure is higher than 130/80.  Follow these instructions at home: Eating and drinking  If directed, follow the DASH eating plan. This diet includes: ? Filling half of your plate at each meal with fruits and vegetables. ? Filling one quarter of your plate at each meal with whole grains. Whole grains include whole wheat pasta, brown rice, and whole grain bread. ? Eating or drinking low-fat dairy products, such as skim milk or low-fat yogurt. ? Filling one quarter of your plate at each meal with low-fat (lean) proteins. Low-fat proteins include fish, skinless chicken, eggs, beans, and tofu. ? Avoiding fatty meat, cured and processed meat, or chicken with skin. ? Avoiding premade or processed food.  Eat less than 1,500 mg of salt (sodium) a day.  Limit alcohol use to no more than 1 drink a day for nonpregnant women and 2 drinks a day for men. One drink equals 12 oz of beer, 5 oz of wine, or 1 oz of hard liquor. Lifestyle  Work with your doctor to stay at a healthy  weight or to lose weight. Ask your doctor what the best weight is for you.  Get at least 30 minutes of exercise that causes your heart to beat faster (aerobic exercise) most days of the week. This may include walking, swimming, or biking.  Get at least 30 minutes of exercise that strengthens your muscles (resistance exercise) at least 3 days a week. This may include lifting weights or pilates.  Do not use any products that contain nicotine or tobacco. This includes cigarettes and e-cigarettes. If you need help quitting, ask your doctor.  Check your blood pressure at home as told by your doctor.  Keep all follow-up visits as told by your doctor. This is important. Medicines  Take over-the-counter and prescription medicines only as told by your doctor. Follow directions carefully.  Do not skip doses of blood pressure medicine. The medicine does not work as well if you skip doses. Skipping doses also puts you at risk for problems.  Ask your doctor about side effects or reactions to medicines that you should watch for. Contact a doctor if:  You think you are having a reaction to the medicine you are taking.  You have headaches that keep coming back (recurring).  You feel dizzy.  You have swelling in your ankles.  You have trouble with your vision. Get help right away if:  You get a very bad headache.  You start to feel confused.  You feel weak or numb.  You feel faint.  You get very bad pain in your: ? Chest. ? Belly (abdomen).  You throw up (vomit) more than once.  You have trouble breathing. Summary  Hypertension is another name for high blood pressure.  Making healthy choices can help lower blood pressure. If your blood pressure cannot be controlled with healthy choices, you may need to take medicine. This information is not intended to replace advice given to you by your health care provider. Make sure you discuss any questions you have with your health care  provider. Document Released: 07/27/2007 Document Revised: 01/06/2016 Document Reviewed: 01/06/2016 Elsevier Interactive Patient Education  [employer]2018 Elsevier Inc.

## 2017-01-10 LAB — GC/CHLAMYDIA PROBE AMP (~~LOC~~) NOT AT ARMC
CHLAMYDIA, DNA PROBE: NEGATIVE
Neisseria Gonorrhea: NEGATIVE

## 2017-02-25 ENCOUNTER — Inpatient Hospital Stay (HOSPITAL_COMMUNITY)
Admission: AD | Admit: 2017-02-25 | Discharge: 2017-02-25 | Disposition: A | Discharge status: Home / Self Care | Payer: BLUE CROSS/BLUE SHIELD | Source: Ambulatory Visit | Attending: Obstetrics and Gynecology | Admitting: Obstetrics and Gynecology

## 2017-02-25 DIAGNOSIS — I10 Essential (primary) hypertension: Secondary | ICD-10-CM | POA: Insufficient documentation

## 2017-02-25 DIAGNOSIS — G43909 Migraine, unspecified, not intractable, without status migrainosus: Secondary | ICD-10-CM | POA: Diagnosis not present

## 2017-02-25 DIAGNOSIS — K625 Hemorrhage of anus and rectum: Secondary | ICD-10-CM | POA: Insufficient documentation

## 2017-02-25 DIAGNOSIS — Z880 Allergy status to penicillin: Secondary | ICD-10-CM | POA: Insufficient documentation

## 2017-02-25 DIAGNOSIS — K649 Unspecified hemorrhoids: Secondary | ICD-10-CM | POA: Insufficient documentation

## 2017-02-25 DIAGNOSIS — F329 Major depressive disorder, single episode, unspecified: Secondary | ICD-10-CM | POA: Insufficient documentation

## 2017-02-25 DIAGNOSIS — F17210 Nicotine dependence, cigarettes, uncomplicated: Secondary | ICD-10-CM | POA: Insufficient documentation

## 2017-02-25 DIAGNOSIS — R918 Other nonspecific abnormal finding of lung field: Secondary | ICD-10-CM | POA: Diagnosis not present

## 2017-02-25 DIAGNOSIS — D649 Anemia, unspecified: Secondary | ICD-10-CM | POA: Insufficient documentation

## 2017-02-25 LAB — URINALYSIS, ROUTINE W REFLEX MICROSCOPIC
Bilirubin Urine: NEGATIVE
GLUCOSE, UA: NEGATIVE mg/dL
KETONES UR: NEGATIVE mg/dL
Leukocytes, UA: NEGATIVE
NITRITE: POSITIVE — AB
PROTEIN: NEGATIVE mg/dL
Specific Gravity, Urine: 1.02 (ref 1.005–1.030)
pH: 5 (ref 5.0–8.0)

## 2017-02-25 NOTE — MAU Note (Signed)
Pt reports she has had rectal bleeding off/on for a year, [facility] and they did not find blood and did not further testing. States she sometimes has pain in her mid/lower abd but not often. Reports this am she felt like she needed to have a BM but when she stood up it was only blood.

## 2017-02-25 NOTE — Discharge Instructions (Signed)
Buy preparation-H for hemorrhoids  Take a stool softener daily  Referral to GI made  Rectal Bleeding Rectal bleeding is when blood comes out of the opening of the butt (anus). People with this kind of bleeding may notice bright red blood in their underwear or in the toilet after they poop (have a bowel movement). They may also have dark red or black poop (stool). Rectal bleeding is often a sign that something is wrong. It needs to be checked by a doctor. Follow these instructions at home: Watch for any changes in your condition. Take these actions to help with bleeding and discomfort:  Eat a diet that is high in fiber. This will keep your poop soft so it is easier for you to poop without pushing too hard. Ask your doctor to tell you what foods and drinks are high in fiber.  Drink enough fluid to keep your pee (urine) clear or pale yellow. This also helps keep your poop soft.  Try taking a warm bath. This may help with pain.  Keep all follow-up visits as told by your doctor. This is important.  Get help right away if:  You have new bleeding.  You have more bleeding than before.  You have black or dark red poop.  You throw up (vomit) blood or something that looks like coffee grounds.  You have pain or tenderness in your belly (abdomen).  You have a fever.  You feel weak.  You feel sick to your stomach (nauseous).  You pass out (faint).  You have very bad pain in your butt.  You cannot poop. This information is not intended to replace advice given to you by your health care provider. Make sure you discuss any questions you have with your health care provider. Document Released: 10/20/2010 Document Revised: 07/16/2015 Document Reviewed: 04/05/2015 Elsevier Interactive Patient Education  [employer]2018 Elsevier Inc.

## 2017-02-25 NOTE — MAU Provider Note (Signed)
Chief Complaint: Rectal Bleeding   SUBJECTIVE HPI: Rachel Fischer is a 47 y.o. [id number] who presents to MAU with complaints of rectal bleeding. Patient states that she noticed rectal bleeding today after bowel movement. She states that she has had rectal bleeding on/off for the last year. Had referral to GI but was unable to go due to no insurance. She states the bleeding scared her today because the blood filled the toilet water and it was on the toilet tissue. Denies any current abdominal pina, constipation, rectal pain, hemorrhoids, fever, n/v/d.   Past Medical History:  Diagnosis Date  . Anemia   . Depression    has not been diagnosed  . Hx of trichomoniasis   . Hypertension    not currently taking meds due to financial reasons  . Lung nodule   . Migraine headache    migraines; gets them 4/month  . Tobacco use    OB History  Gravida Para Term Preterm AB Living  4 4 4     4   SAB TAB Ectopic Multiple Live Births          4    # Outcome Date GA Lbr Len/2nd Weight Sex Delivery Anes PTL Lv  4 Term      CS-LTranv     3 Term      CS-LTranv     2 Term      CS-LTranv     1 Term      CS-LTranv        Past Surgical History:  Procedure Laterality Date  . ABDOMINAL HYSTERECTOMY    . ARM NEUROPLASTY Left   . c section x 4    . CESAREAN SECTION    . FOOT SURGERY     Social History   Socioeconomic History  . Marital status: Single    Spouse name: Not on file  . Number of children: 4  . Years of education: Not on file  . Highest education level: Not on file  Social Needs  . Financial resource strain: Not on file  . Food insecurity - worry: Not on file  . Food insecurity - inability: Not on file  . Transportation needs - medical: Not on file  . Transportation needs - non-medical: Not on file  Occupational History  . Occupation: unemployed    Associate Professor: UNEMPLOYED  Tobacco Use  . Smoking status: Current Every Day Smoker    Packs/day: 0.50    Years: 19.00    Pack years:  9.50    Types: Cigarettes  . Smokeless tobacco: Never Used  Substance and Sexual Activity  . Alcohol use: No  . Drug use: Yes    Types: Marijuana  . Sexual activity: Yes    Birth control/protection: None  Other Topics Concern  . Not on file  Social History Narrative   Children: 3 boys and 1 girl.    No current facility-administered medications on file prior to encounter.    Current Outpatient Medications on File Prior to Encounter  Medication Sig Dispense Refill  . fluconazole (DIFLUCAN) 150 MG tablet Take 1 tablet (150 mg total) daily by mouth. 1 tablet 1  . metroNIDAZOLE (METROGEL VAGINAL) 0.75 % vaginal gel Place 1 Applicatorful 2 (two) times daily vaginally. 70 g 0  . [DISCONTINUED] dicyclomine (BENTYL) 20 MG tablet Take 1 tablet (20 mg total) by mouth 2 (two) times daily. (Patient not taking: Reported on 04/29/2014) 20 tablet 0  . [DISCONTINUED] olmesartan-hydrochlorothiazide (BENICAR HCT) 20-12.5 MG per tablet Take  1 tablet by mouth daily.       Allergies  Allergen Reactions  . Ciprofloxacin Other (See Comments)    Generalized muscle and joint pain.  Marland Kitchen. Morphine And Related Itching and Nausea And Vomiting  . Penicillins Hives    Has patient had a PCN reaction causing immediate rash, facial/tongue/throat swelling, SOB or lightheadedness with hypotension: No Has patient had a PCN reaction causing severe rash involving mucus membranes or skin necrosis: No Has patient had a PCN reaction that required hospitalization No Has patient had a PCN reaction occurring within the last 10 years: No If all of the above answers are "NO", then may proceed with Cephalosporin use.     I have reviewed the past Medical Hx, Surgical Hx, Social Hx, Allergies and Medications.   REVIEW OF SYSTEMS All systems reviewed and are negative for acute change except as noted in the HPI.   OBJECTIVE BP (!) 138/94 (BP Location: Right Arm)   Pulse 96   Temp 98.8 F (37.1 C) (Oral)   Resp 18   Ht 5'  (1.524 m)   Wt 60.8 kg (134 lb)   SpO2 100%   BMI 26.17 kg/m    PHYSICAL EXAM Constitutional: Well-developed, well-nourished female in no acute distress.  Cardiovascular: normal rate and rhythm, pulses intact Respiratory: normal rate and effort.  GI: Abd soft, non-tender, non-distended. Pos BS x 4 MS: Extremities nontender, no edema, normal ROM Neurologic: Alert and oriented x 4. No focal deficits Rectal: non inflammed hemorrhoids externally, no fissure, no visible bleeding, rectal exam with blood on glove. No palpable internal hemorrhoids or stool in rectum.  Psych: normal mood and affect  LAB RESULTS Results for orders placed or performed during the hospital encounter of 02/25/17 (from the past 24 hour(s))  Urinalysis, Routine w reflex microscopic     Status: Abnormal   Collection Time: 02/25/17 12:52 PM  Result Value Ref Range   Color, Urine YELLOW YELLOW   APPearance CLOUDY (A) CLEAR   Specific Gravity, Urine 1.020 1.005 - 1.030   pH 5.0 5.0 - 8.0   Glucose, UA NEGATIVE NEGATIVE mg/dL   Hgb urine dipstick MODERATE (A) NEGATIVE   Bilirubin Urine NEGATIVE NEGATIVE   Ketones, ur NEGATIVE NEGATIVE mg/dL   Protein, ur NEGATIVE NEGATIVE mg/dL   Nitrite POSITIVE (A) NEGATIVE   Leukocytes, UA NEGATIVE NEGATIVE   RBC / HPF 0-5 0 - 5 RBC/hpf   WBC, UA 0-5 0 - 5 WBC/hpf   Bacteria, UA RARE (A) NONE SEEN   Squamous Epithelial / LPF 6-30 (A) NONE SEEN   Mucus PRESENT    Amorphous Crystal PRESENT     IMAGING No results found.  MAU COURSE Vitals and nursing notes reviewed Hemodynamically stable Rectal exam significant for blood Treatments given in MAU: None  MDM Plan of care reviewed with patient, including labs and tests ordered and medical treatment.   ASSESSMENT 1. Rectal bleeding   2. Hemorrhoids, unspecified hemorrhoid type     PLAN Discharge home in stable condition Patient encouraged to take OTC hemorrhoidal preparations Needs GI follow-up; referral  placed Bowel regimen encouraged Counseled on return precautions Handout given   Caryl AdaJazma Mae Denunzio, DO OB Fellow Faculty Practice, [facility] -  02/25/2017, 1:07 PM

## 2017-04-10 ENCOUNTER — Other Ambulatory Visit: Payer: Self-pay

## 2017-04-10 ENCOUNTER — Encounter (HOSPITAL_BASED_OUTPATIENT_CLINIC_OR_DEPARTMENT_OTHER): Payer: Self-pay | Admitting: Emergency Medicine

## 2017-04-10 ENCOUNTER — Emergency Department (HOSPITAL_BASED_OUTPATIENT_CLINIC_OR_DEPARTMENT_OTHER): Payer: BLUE CROSS/BLUE SHIELD

## 2017-04-10 ENCOUNTER — Emergency Department (HOSPITAL_BASED_OUTPATIENT_CLINIC_OR_DEPARTMENT_OTHER)
Admission: EM | Admit: 2017-04-10 | Discharge: 2017-04-10 | Disposition: A | Discharge status: Home / Self Care | Payer: BLUE CROSS/BLUE SHIELD | Attending: Emergency Medicine | Admitting: Emergency Medicine

## 2017-04-10 DIAGNOSIS — N3000 Acute cystitis without hematuria: Secondary | ICD-10-CM | POA: Diagnosis not present

## 2017-04-10 DIAGNOSIS — I10 Essential (primary) hypertension: Secondary | ICD-10-CM | POA: Insufficient documentation

## 2017-04-10 DIAGNOSIS — R0789 Other chest pain: Secondary | ICD-10-CM

## 2017-04-10 DIAGNOSIS — F17210 Nicotine dependence, cigarettes, uncomplicated: Secondary | ICD-10-CM | POA: Insufficient documentation

## 2017-04-10 DIAGNOSIS — Z79899 Other long term (current) drug therapy: Secondary | ICD-10-CM | POA: Diagnosis not present

## 2017-04-10 DIAGNOSIS — R079 Chest pain, unspecified: Secondary | ICD-10-CM | POA: Diagnosis present

## 2017-04-10 LAB — COMPREHENSIVE METABOLIC PANEL
ALBUMIN: 4.1 g/dL (ref 3.5–5.0)
ALK PHOS: 62 U/L (ref 38–126)
ALT: 15 U/L (ref 14–54)
AST: 22 U/L (ref 15–41)
Anion gap: 11 (ref 5–15)
BILIRUBIN TOTAL: 0.4 mg/dL (ref 0.3–1.2)
BUN: 10 mg/dL (ref 6–20)
CALCIUM: 8.7 mg/dL — AB (ref 8.9–10.3)
CO2: 22 mmol/L (ref 22–32)
Chloride: 101 mmol/L (ref 101–111)
Creatinine, Ser: 0.5 mg/dL (ref 0.44–1.00)
GLUCOSE: 104 mg/dL — AB (ref 65–99)
POTASSIUM: 3 mmol/L — AB (ref 3.5–5.1)
Sodium: 134 mmol/L — ABNORMAL LOW (ref 135–145)
Total Protein: 7.2 g/dL (ref 6.5–8.1)

## 2017-04-10 LAB — CBC WITH DIFFERENTIAL/PLATELET
BASOS PCT: 0 %
Basophils Absolute: 0 10*3/uL (ref 0.0–0.1)
Eosinophils Absolute: 0 10*3/uL (ref 0.0–0.7)
Eosinophils Relative: 1 %
HEMATOCRIT: 37.7 % (ref 36.0–46.0)
HEMOGLOBIN: 12.8 g/dL (ref 12.0–15.0)
LYMPHS PCT: 27 %
Lymphs Abs: 1.5 10*3/uL (ref 0.7–4.0)
MCH: 28.5 pg (ref 26.0–34.0)
MCHC: 34 g/dL (ref 30.0–36.0)
MCV: 84 fL (ref 78.0–100.0)
MONO ABS: 0.4 10*3/uL (ref 0.1–1.0)
Monocytes Relative: 7 %
NEUTROS PCT: 65 %
Neutro Abs: 3.5 10*3/uL (ref 1.7–7.7)
Platelets: 315 10*3/uL (ref 150–400)
RBC: 4.49 MIL/uL (ref 3.87–5.11)
RDW: 12.6 % (ref 11.5–15.5)
WBC: 5.4 10*3/uL (ref 4.0–10.5)

## 2017-04-10 LAB — URINALYSIS, MICROSCOPIC (REFLEX)

## 2017-04-10 LAB — URINALYSIS, ROUTINE W REFLEX MICROSCOPIC
Bilirubin Urine: NEGATIVE
GLUCOSE, UA: NEGATIVE mg/dL
Ketones, ur: NEGATIVE mg/dL
LEUKOCYTES UA: NEGATIVE
NITRITE: POSITIVE — AB
PH: 7 (ref 5.0–8.0)
Protein, ur: NEGATIVE mg/dL
Specific Gravity, Urine: <1.005 — ABNORMAL LOW (ref 1.005–1.030)

## 2017-04-10 LAB — LIPASE, BLOOD: Lipase: 25 U/L (ref 11–51)

## 2017-04-10 LAB — TROPONIN I: Troponin I: <0.03 ng/mL (ref <0.03)

## 2017-04-10 MED ORDER — NITROFURANTOIN MONOHYD MACRO 100 MG PO CAPS: 100.0000 mg | ORAL_CAPSULE | Freq: Two times a day (BID) | ORAL | 0 refills | Status: DC

## 2017-04-10 MED ORDER — NAPROXEN 500 MG PO TABS: 500.0000 mg | ORAL_TABLET | Freq: Two times a day (BID) | ORAL | 0 refills | Status: DC

## 2017-04-10 NOTE — ED Notes (Signed)
Pt verbalizes understanding of d/c instructions and denies any further needs at this time. 

## 2017-04-10 NOTE — Discharge Instructions (Signed)
You were seen today for chest pain.  Your pain was thought to be musculoskeletal.  Take naproxen for pain.  You are also being treated for at UTI.

## 2017-04-10 NOTE — ED Provider Notes (Signed)
[facility] EMERGENCY DEPARTMENT Provider Note   CSN: [id number] Arrival date & time: 04/10/17  1736     History   Chief Complaint Chief Complaint  Patient presents with  . Chest Pain    HPI Rachel Fischer is a 47 y.o. female.  HPI  This is a 47 year old who presents with chest pain.  Patient reports 24-hour history of anterior chest pain that is nonradiating.  It is over the left side.  Pain has since resolved and she is pain-free.  She did report some shortness of breath.  No cough or fevers.  She is a smoker.  Patient reports pain was worse when she laid on her left side.  No noted rashes.  No history of blood clots, recent hospitalization, long travel, leg swelling.  She did not take any medications for her pain.  She reports decreased urine output over the last 24 hours.  No frequency or urgency.   Past Medical History:  Diagnosis Date  . Anemia   . Depression    has not been diagnosed  . Hx of trichomoniasis   . Hypertension    not currently taking meds due to financial reasons  . Lung nodule   . Migraine headache    migraines; gets them 4/month  . Tobacco use     Patient Active Problem List   Diagnosis Date Noted  . Ovarian cyst 03/17/2016  . Family history of breast cancer 03/17/2016  . Blurred vision   . Cephalalgia   . Vision changes   . Secondary hypertension, unspecified   . Headache 08/06/2015  . Pulmonary nodule 06/07/2011    Past Surgical History:  Procedure Laterality Date  . ABDOMINAL HYSTERECTOMY    . ARM NEUROPLASTY Left   . c section x 4    . CESAREAN SECTION    . FOOT SURGERY      OB History    Gravida Para Term Preterm AB Living   4 4 4     4    SAB TAB Ectopic Multiple Live Births           4       Home Medications    Prior to Admission medications   Medication Sig Start Date End Date Taking? Authorizing Provider  Ibuprofen-Diphenhydramine HCl (ADVIL PM) 200-25 MG CAPS Take 2 capsules by mouth daily as needed  (For headache.).    [provider]  naproxen (NAPROSYN) 500 MG tablet Take 1 tablet (500 mg total) by mouth 2 (two) times daily. 04/10/17   Romy Ipock, Mayer Maskerourtney F, MD  nitrofurantoin, macrocrystal-monohydrate, (MACROBID) 100 MG capsule Take 1 capsule (100 mg total) by mouth 2 (two) times daily. 04/10/17   Croix Presley, Mayer Maskerourtney F, MD  dicyclomine (BENTYL) 20 MG tablet Take 1 tablet (20 mg total) by mouth 2 (two) times daily. Patient not taking: Reported on 04/29/2014 02/06/14 04/29/14  Terri PiedraForcucci, Kecia Swoboda, PA-C  olmesartan-hydrochlorothiazide (BENICAR HCT) 20-12.5 MG per tablet Take 1 tablet by mouth daily.    09/09/13  [provider]    Family History Family History  Problem Relation Age of Onset  . Breast cancer Mother 555  . Hypertension Mother   . Hyperlipidemia Mother   . CAD Mother     Social History Social History   Tobacco Use  . Smoking status: Current Every Day Smoker    Packs/day: 0.50    Years: 19.00    Pack years: 9.50    Types: Cigarettes  . Smokeless tobacco: Never Used  Substance  Use Topics  . Alcohol use: No  . Drug use: Yes    Types: Marijuana     Allergies   Ciprofloxacin; Morphine and related; and Penicillins   Review of Systems Review of Systems  Constitutional: Negative for fever.  Respiratory: Positive for shortness of breath. Negative for cough.   Cardiovascular: Positive for chest pain. Negative for leg swelling.  Gastrointestinal: Negative for abdominal pain, nausea and vomiting.  Genitourinary: Positive for difficulty urinating. Negative for dysuria.  Musculoskeletal: Negative for back pain.  Skin: Negative for rash.  All other systems reviewed and are negative.    Physical Exam Updated Vital Signs BP (!) 158/111 (BP Location: Right Arm)   Pulse 91   Temp 98.9 F (37.2 C) (Oral)   Resp 18   Ht 5' (1.524 m)   Wt 62.1 kg (137 lb)   SpO2 100%   BMI 26.76 kg/m   Physical Exam  Constitutional: She is oriented to person, place,  and time. She appears well-developed and well-nourished. She does not appear ill.  HENT:  Head: Normocephalic and atraumatic.  Cardiovascular: Normal rate, regular rhythm, normal heart sounds and normal pulses.  Tenderness palpation left chest wall, no overlying skin changes, no crepitus  Pulmonary/Chest: Effort normal. No respiratory distress. She has no wheezes.  Abdominal: Soft. Bowel sounds are normal.  Musculoskeletal:       Right lower leg: She exhibits no edema.       Left lower leg: She exhibits no edema.  Neurological: She is alert and oriented to person, place, and time.  Skin: Skin is warm and dry.  Psychiatric: She has a normal mood and affect.  Nursing note and vitals reviewed.    ED Treatments / Results  Labs (all labs ordered are listed, but only abnormal results are displayed) Labs Reviewed  COMPREHENSIVE METABOLIC PANEL - Abnormal; Notable for the following components:      Result Value   Sodium 134 (*)    Potassium 3.0 (*)    Glucose, Bld 104 (*)    Calcium 8.7 (*)    All other components within normal limits  URINALYSIS, ROUTINE W REFLEX MICROSCOPIC - Abnormal; Notable for the following components:   APPearance CLOUDY (*)    Specific Gravity, Urine <1.005 (*)    Hgb urine dipstick SMALL (*)    Nitrite POSITIVE (*)    All other components within normal limits  URINALYSIS, MICROSCOPIC (REFLEX) - Abnormal; Notable for the following components:   Bacteria, UA MANY (*)    Squamous Epithelial / LPF 0-5 (*)    All other components within normal limits  URINE CULTURE  TROPONIN I  CBC WITH DIFFERENTIAL/PLATELET  LIPASE, BLOOD    EKG  EKG Interpretation  Date/Time:  Monday April 10 2017 17:48:03 EST Ventricular Rate:  102 PR Interval:  164 QRS Duration: 64 QT Interval:  354 QTC Calculation: 461 R Axis:   63 Text Interpretation:  Sinus tachycardia Otherwise normal ECG When compared to prior, no significantchanges seen.  no STEMI Confirmed by Theda Belfast ([id number]) on 04/10/2017 9:00:29 PM       Radiology Dg Chest 2 View  Result Date: 04/10/2017 CLINICAL DATA:  Left-sided chest pain with shortness of breath EXAM: CHEST  2 VIEW COMPARISON:  04/19/2016 FINDINGS: The cardiac silhouette, mediastinal and hilar contours are within normal limits and stable. The lungs are clear. No pleural effusion. The bony thorax is intact. IMPRESSION: No acute cardiopulmonary findings. Electronically Signed   By: Orlene Plum.D.  On: 04/10/2017 21:28    Procedures Procedures (including critical care time)  Medications Ordered in ED Medications - No data to display   Initial Impression / Assessment and Plan / ED Course  I have reviewed the triage vital signs and the nursing notes.  Pertinent labs & imaging results that were available during my care of the patient were reviewed by me and considered in my medical decision making (see chart for details).     Patient presents with chest pain.  Ongoing for the last 24 hours and self resolved.  Vital signs notable for a blood pressure of 158/111.  Reports that she has not taken blood pressure medications in over a year.  She has reproducible pain on exam.  Low risk for ACS and EKG is reassuring.  Troponin is negative.  This is sufficient for rule out at this time given duration of symptoms.  Low risk PE.  Chest x-ray shows no evidence of pneumonia or pneumothorax.  Given reproducible nature of the pain, suspect musculoskeletal etiology.  Of note, urinalysis is nitrite positive with 6-30 white cells and bacteria.  Previous urine cultures have been negative or less than 10,000 species.  Repeat urine culture sent.  Given difficulty urination, will treat with Macrobid for 7 days.  Recommend anti-inflammatories for likely musculoskeletal pain.  After history, exam, and medical workup I feel the patient has been appropriately medically screened and is safe for discharge home. Pertinent diagnoses were discussed with  the patient. Patient was given return precautions.   Final Clinical Impressions(s) / ED Diagnoses   Final diagnoses:  Chest wall pain  Acute cystitis without hematuria    ED Discharge Orders        Ordered    naproxen (NAPROSYN) 500 MG tablet  2 times daily     04/10/17 2330    nitrofurantoin, macrocrystal-monohydrate, (MACROBID) 100 MG capsule  2 times daily     04/10/17 2332       Florinda Taflinger, Mayer Masker, MD 04/11/17 [phone]

## 2017-04-10 NOTE — ED Notes (Signed)
Patient transported to X-ray 

## 2017-04-10 NOTE — ED Triage Notes (Signed)
Patient reports chest pain which began yesterday.  Reports shortness of breath, nausea and vomiting.

## 2017-04-10 NOTE — ED Notes (Signed)
Pt c/o left side breast pain since last night.  States she has not had any BP medication since last year.  Smokes, took ibuprofen yesterday.

## 2017-04-14 LAB — URINE CULTURE

## 2017-04-15 ENCOUNTER — Telehealth: Payer: Self-pay

## 2017-04-15 NOTE — Telephone Encounter (Signed)
Post ED Visit - Positive Culture Follow-up  Culture report reviewed by antimicrobial stewardship pharmacist:  []  Rachel Fischer, Pharm.Fischer. []  Rachel Fischer, Pharm.Fischer., BCPS AQ-ID []  Rachel Fischer, Pharm.Fischer., BCPS []  Rachel Fischer, Pharm.Fischer., BCPS []  Rachel Fischer, [address]Fischer., BCPS, AAHIVP []  Rachel Fischer, Pharm.Fischer., BCPS, AAHIVP []  Rachel Fischer, PharmD, BCPS []  Rachel Fischer, PharmD []  Rachel Fischer, PharmD, BCPS [facility] Positive urine culture Treated with Macrobid, organism sensitive to the same and no further patient follow-up is required at this time.  Rachel Fischer, Rachel Fischer 04/15/2017, 10:28 AM

## 2017-04-17 ENCOUNTER — Encounter (HOSPITAL_COMMUNITY): Payer: Self-pay | Admitting: *Deleted

## 2017-04-17 ENCOUNTER — Emergency Department (HOSPITAL_COMMUNITY)
Admission: EM | Admit: 2017-04-17 | Discharge: 2017-04-17 | Disposition: A | Discharge status: Home / Self Care | Payer: BLUE CROSS/BLUE SHIELD | Attending: Emergency Medicine | Admitting: Emergency Medicine

## 2017-04-17 ENCOUNTER — Other Ambulatory Visit: Payer: Self-pay

## 2017-04-17 DIAGNOSIS — Z79899 Other long term (current) drug therapy: Secondary | ICD-10-CM | POA: Diagnosis not present

## 2017-04-17 DIAGNOSIS — R55 Syncope and collapse: Secondary | ICD-10-CM

## 2017-04-17 DIAGNOSIS — N3090 Cystitis, unspecified without hematuria: Secondary | ICD-10-CM | POA: Insufficient documentation

## 2017-04-17 DIAGNOSIS — I10 Essential (primary) hypertension: Secondary | ICD-10-CM | POA: Diagnosis not present

## 2017-04-17 DIAGNOSIS — F17210 Nicotine dependence, cigarettes, uncomplicated: Secondary | ICD-10-CM | POA: Insufficient documentation

## 2017-04-17 DIAGNOSIS — E876 Hypokalemia: Secondary | ICD-10-CM | POA: Diagnosis not present

## 2017-04-17 DIAGNOSIS — N3000 Acute cystitis without hematuria: Secondary | ICD-10-CM

## 2017-04-17 LAB — URINALYSIS, ROUTINE W REFLEX MICROSCOPIC
Bilirubin Urine: NEGATIVE
GLUCOSE, UA: NEGATIVE mg/dL
Ketones, ur: NEGATIVE mg/dL
Leukocytes, UA: NEGATIVE
Nitrite: POSITIVE — AB
PROTEIN: NEGATIVE mg/dL
SPECIFIC GRAVITY, URINE: 1.006 (ref 1.005–1.030)
pH: 7 (ref 5.0–8.0)

## 2017-04-17 LAB — CBC
HCT: 40.3 % (ref 36.0–46.0)
Hemoglobin: 13.3 g/dL (ref 12.0–15.0)
MCH: 28.4 pg (ref 26.0–34.0)
MCHC: 33 g/dL (ref 30.0–36.0)
MCV: 86.1 fL (ref 78.0–100.0)
PLATELETS: 383 10*3/uL (ref 150–400)
RBC: 4.68 MIL/uL (ref 3.87–5.11)
RDW: 13.2 % (ref 11.5–15.5)
WBC: 6.7 10*3/uL (ref 4.0–10.5)

## 2017-04-17 LAB — BASIC METABOLIC PANEL
Anion gap: 13 (ref 5–15)
BUN: 7 mg/dL (ref 6–20)
CHLORIDE: 98 mmol/L — AB (ref 101–111)
CO2: 24 mmol/L (ref 22–32)
CREATININE: 0.62 mg/dL (ref 0.44–1.00)
Calcium: 9.2 mg/dL (ref 8.9–10.3)
GFR calc Af Amer: >60 mL/min (ref >60)
GFR calc non Af Amer: >60 mL/min (ref >60)
GLUCOSE: 123 mg/dL — AB (ref 65–99)
Potassium: 3.2 mmol/L — ABNORMAL LOW (ref 3.5–5.1)
Sodium: 135 mmol/L (ref 135–145)

## 2017-04-17 LAB — I-STAT BETA HCG BLOOD, ED (MC, WL, AP ONLY)

## 2017-04-17 LAB — D-DIMER, QUANTITATIVE: D-Dimer, Quant: <0.27 ug/mL-FEU (ref 0.00–0.50)

## 2017-04-17 LAB — CBG MONITORING, ED: Glucose-Capillary: 107 mg/dL — ABNORMAL HIGH (ref 65–99)

## 2017-04-17 MED ORDER — NITROFURANTOIN MONOHYD MACRO 100 MG PO CAPS: 100.0000 mg | ORAL_CAPSULE | Freq: Two times a day (BID) | ORAL | 0 refills | Status: DC

## 2017-04-17 MED ORDER — SODIUM CHLORIDE 0.9 % IV BOLUS (SEPSIS)
1000.0000 mL | Freq: Once | INTRAVENOUS | Status: AC
  Administered 2017-04-17: 1000 mL via INTRAVENOUS

## 2017-04-17 MED ORDER — ONDANSETRON HCL 4 MG/2ML IJ SOLN
4.0000 mg | Freq: Once | INTRAMUSCULAR | Status: AC
  Administered 2017-04-17: 4 mg via INTRAVENOUS
  Filled 2017-04-17: qty 2

## 2017-04-17 MED ORDER — KETOROLAC TROMETHAMINE 15 MG/ML IJ SOLN
15.0000 mg | Freq: Once | INTRAMUSCULAR | Status: AC
  Administered 2017-04-17: 15 mg via INTRAVENOUS
  Filled 2017-04-17: qty 1

## 2017-04-17 MED ORDER — NITROFURANTOIN MONOHYD MACRO 100 MG PO CAPS
100.0000 mg | ORAL_CAPSULE | Freq: Once | ORAL | Status: AC
  Administered 2017-04-17: 100 mg via ORAL
  Filled 2017-04-17: qty 1

## 2017-04-17 NOTE — Discharge Instructions (Signed)
Please follow up with your doctor Take Macrobid twice a day for 5 days for UTI Drink plenty of fluids Return if worsening

## 2017-04-17 NOTE — ED Provider Notes (Signed)
[facility]   CSN: [id number] Arrival date & time: 04/17/17  1150   History   Chief Complaint Chief Complaint  Patient presents with  . Near Syncope    HPI Elisabeth PigeonDalina J Clementson is a 47 y.o. female who presents with multiple symptoms. PMH significant for HTN, hx of migraines, tobacco use. She states that today while she was at work she had an acute onset of feeling hot, sweats, lightheadedness, nausea. Her boss had her sit down and then called EMS. Her blood pressure was checked and was ~180/100 which was "stroke level" so she was brought to the ED. She reports that now she has a severe posterior headache. She has a history of migraines but it does not feel similar. She reports improvement when she puts pressure on the posterior scalp. She denies weakness or vision changes. Her hypertension has been untreated up until a couple days ago when she got a new prescription for Lisinopril/hctz. She denies abdominal pain, vomiting, diarrhea. She has been eating and drinking fluids normally. She denies any recent fever, URI symptoms, cough.  Additionally she reports left sided chest tightness and SOB. This has been going on for ~2 weeks. She reports SOB with talking and it's better with rest. [facility] and [facility] a chest pain work up. It was overall unremarkable so was given an rx for Naproxen but she has not been taking this. She also had and a UTI and in the EMR she was prescribed Macrobid but she states she never had this prescription with her paperwork so did not have this filled. She is also concerned about a lung nodule in the left lung that she's had for several years.   HPI  Past Medical History:  Diagnosis Date  . Anemia   . Depression    has not been diagnosed  . Hx of trichomoniasis   . Hypertension    not currently taking meds due to financial reasons  . Lung nodule   . Migraine headache    migraines; gets them 4/month    . Tobacco use     Patient Active Problem List   Diagnosis Date Noted  . Ovarian cyst 03/17/2016  . Family history of breast cancer 03/17/2016  . Blurred vision   . Cephalalgia   . Vision changes   . Secondary hypertension, unspecified   . Headache 08/06/2015  . Pulmonary nodule 06/07/2011    Past Surgical History:  Procedure Laterality Date  . ABDOMINAL HYSTERECTOMY    . ARM NEUROPLASTY Left   . c section x 4    . CESAREAN SECTION    . FOOT SURGERY      OB History    Gravida Para Term Preterm AB Living   4 4 4     4    SAB TAB Ectopic Multiple Live Births           4       Home Medications    Prior to Admission medications   Medication Sig Start Date End Date Taking? Authorizing Provider  lisinopril-hydrochlorothiazide (PRINZIDE,ZESTORETIC) 20-25 MG tablet Take 1 tablet by mouth daily.   Yes [provider]  naproxen (NAPROSYN) 500 MG tablet Take 1 tablet (500 mg total) by mouth 2 (two) times daily. Patient not taking: Reported on 04/17/2017 04/10/17   Horton, Mayer Maskerourtney F, MD  nitrofurantoin, macrocrystal-monohydrate, (MACROBID) 100 MG capsule Take 1 capsule (100 mg total) by mouth 2 (two) times daily.  Patient not taking: Reported on 04/17/2017 04/10/17   Horton, Mayer Masker, MD    Family History Family History  Problem Relation Age of Onset  . Breast cancer Mother 65  . Hypertension Mother   . Hyperlipidemia Mother   . CAD Mother     Social History Social History   Tobacco Use  . Smoking status: Current Every Day Smoker    Packs/day: 0.50    Years: 19.00    Pack years: 9.50    Types: Cigarettes  . Smokeless tobacco: Never Used  Substance Use Topics  . Alcohol use: No  . Drug use: Yes    Types: Marijuana     Allergies   Ciprofloxacin; Morphine and related; and Penicillins   Review of Systems Review of Systems  Constitutional: Negative for fever.  HENT: Negative for congestion.   Respiratory: Positive for chest tightness and shortness  of breath. Negative for cough and wheezing.   Cardiovascular: Negative for chest pain and leg swelling.  Gastrointestinal: Positive for nausea. Negative for abdominal pain, diarrhea and vomiting.  Genitourinary: Positive for dysuria.  Musculoskeletal: Positive for back pain.  Neurological: Positive for light-headedness and headaches. Negative for syncope.  All other systems reviewed and are negative.    Physical Exam Updated Vital Signs BP (!) 157/104   Pulse (!) 106   Temp 98.4 F (36.9 C) (Oral)   Resp 18   SpO2 100%   Physical Exam  Constitutional: She is oriented to person, place, and time. She appears well-developed and well-nourished. No distress.  HENT:  Head: Normocephalic and atraumatic.  Eyes: Conjunctivae are normal. Pupils are equal, round, and reactive to light. Right eye exhibits no discharge. Left eye exhibits no discharge. No scleral icterus.  Neck: Normal range of motion.  Cardiovascular: Tachycardia present. Exam reveals no gallop and no friction rub.  No murmur heard. Pulmonary/Chest: Effort normal and breath sounds normal. No respiratory distress. She exhibits tenderness (mild left sided chest tenderness and mid left back tenderness).  Abdominal: She exhibits no distension.  Musculoskeletal:  No leg swelling  Neurological: She is alert and oriented to person, place, and time.  Lying on stretcher in NAD. GCS 15. Speaks in a clear voice. Cranial nerves II through XII grossly intact. 5/5 strength in all extremities. Sensation fully intact.  Bilateral finger-to-nose intact. Ambulatory    Skin: Skin is warm and dry.  Psychiatric: She has a normal mood and affect. Her behavior is normal.  Nursing note and vitals reviewed.    ED Treatments / Results  Labs (all labs ordered are listed, but only abnormal results are displayed) Labs Reviewed  BASIC METABOLIC PANEL - Abnormal; Notable for the following components:      Result Value   Potassium 3.2 (*)     Chloride 98 (*)    Glucose, Bld 123 (*)    All other components within normal limits  URINALYSIS, ROUTINE W REFLEX MICROSCOPIC - Abnormal; Notable for the following components:   Hgb urine dipstick SMALL (*)    Nitrite POSITIVE (*)    Bacteria, UA MANY (*)    Squamous Epithelial / LPF 0-5 (*)    All other components within normal limits  CBG MONITORING, ED - Abnormal; Notable for the following components:   Glucose-Capillary 107 (*)    All other components within normal limits  CBC  D-DIMER, QUANTITATIVE (NOT AT [facility])  I-STAT BETA HCG BLOOD, ED ([facility], [facility], [facility] ONLY)    EKG  EKG Interpretation None  Radiology No results found.  Procedures Procedures (including critical care time)  Medications Ordered in ED Medications  sodium chloride 0.9 % bolus 1,000 mL (0 mLs Intravenous Stopped 04/17/17 2129)  ketorolac (TORADOL) 15 MG/ML injection 15 mg (15 mg Intravenous Given 04/17/17 1932)  ondansetron (ZOFRAN) injection 4 mg (4 mg Intravenous Given 04/17/17 1932)  nitrofurantoin (macrocrystal-monohydrate) (MACROBID) capsule 100 mg (100 mg Oral Given 04/17/17 1930)     Initial Impression / Assessment and Plan / ED Course  I have reviewed the triage vital signs and the nursing notes.  Pertinent labs & imaging results that were available during my care of the patient were reviewed by me and considered in my medical decision making (see chart for details).  47 year old female presents with near syncope and multiple other complaints. She is mildly tachycardic and hypertensive but otherwise vitals are normal. Exam is overall unremarkable. Suspect near syncope was vasovagal vs orthostatic due to her prodromal symptoms however she is complaining of persistent chest tightness and mild SOB. Will collect D-dimer to r/o PE. She had a recent chest pain work up which was normal so will not repeat troponin and CXR.  Blood work is remarkable for mild hypokalemia which is chronic. EKG is NSR.  D-dimer is negative. She has been restarted on her BP meds but it still seems to be uncontrolled. Advised f/u with her PCP regarding this. She was given fluids, toradol, zofran with good relief of symptoms. Her UTI has been untreated so she was given a dose of Macrobid in the ED and prescription was re-printed. She verbalized understanding of all results and was given return precautions.  Final Clinical Impressions(s) / ED Diagnoses   Final diagnoses:  Acute cystitis without hematuria  Near syncope  Hypokalemia    ED Discharge Orders    None       Beryle Quant 04/17/17 2150    Linwood Dibbles, MD 04/18/17 1540

## 2017-04-17 NOTE — ED Notes (Signed)
Pt ambulates to bathroom with steady gaitr.

## 2017-04-17 NOTE — ED Triage Notes (Signed)
Pt arrived by [facility] from work. Had near syncope episode, felt lightheaded, weak and diaphoretic. cbg 116.

## 2017-10-11 ENCOUNTER — Emergency Department (HOSPITAL_COMMUNITY): Payer: BLUE CROSS/BLUE SHIELD

## 2017-10-11 ENCOUNTER — Emergency Department (HOSPITAL_COMMUNITY)
Admission: EM | Admit: 2017-10-11 | Discharge: 2017-10-11 | Disposition: A | Discharge status: Home / Self Care | Payer: BLUE CROSS/BLUE SHIELD | Attending: Emergency Medicine | Admitting: Emergency Medicine

## 2017-10-11 ENCOUNTER — Encounter (HOSPITAL_COMMUNITY): Payer: Self-pay | Admitting: *Deleted

## 2017-10-11 DIAGNOSIS — R030 Elevated blood-pressure reading, without diagnosis of hypertension: Secondary | ICD-10-CM

## 2017-10-11 DIAGNOSIS — Z79899 Other long term (current) drug therapy: Secondary | ICD-10-CM | POA: Diagnosis not present

## 2017-10-11 DIAGNOSIS — M791 Myalgia, unspecified site: Secondary | ICD-10-CM | POA: Diagnosis not present

## 2017-10-11 DIAGNOSIS — F17210 Nicotine dependence, cigarettes, uncomplicated: Secondary | ICD-10-CM | POA: Insufficient documentation

## 2017-10-11 DIAGNOSIS — M7918 Myalgia, other site: Secondary | ICD-10-CM

## 2017-10-11 DIAGNOSIS — I10 Essential (primary) hypertension: Secondary | ICD-10-CM | POA: Insufficient documentation

## 2017-10-11 MED ORDER — METHOCARBAMOL 500 MG PO TABS: 500.0000 mg | ORAL_TABLET | Freq: Two times a day (BID) | ORAL | 0 refills | Status: DC

## 2017-10-11 MED ORDER — IBUPROFEN 400 MG PO TABS: 400.0000 mg | ORAL_TABLET | Freq: Four times a day (QID) | ORAL | 0 refills | Status: DC | PRN

## 2017-10-11 NOTE — Discharge Instructions (Addendum)
Please return to the Emergency Department for any new or worsening symptoms or if your symptoms do not improve. Please be sure to follow up with your Primary Care Physician as soon as possible regarding your visit today. If you do not have a Primary Doctor please use the resources below to establish one. You may take the muscle relaxer, Robaxin, as prescribed.  Please do not drive or operate heavy machinery will take this medication because it will make you drowsy. You may take the anti-inflammatory medication ibuprofen as prescribed.  Please drink plenty water while take this medication. Your blood pressure was elevated here today.  Please take your blood pressure medication as prescribed by her primary care doctor and follow up with him regarding this as well.  Contact a health care provider if: Your muscle pain gets worse and medicines do not help. You have muscle pain that lasts longer than 3 days. You have a rash or fever along with muscle pain. You have muscle pain after a tick bite. You have muscle pain while working out, even though you are in good physical condition. You have redness, soreness, or swelling along with muscle pain. You have muscle pain after starting a new medicine or changing the dose of a medicine. Get help right away if: You have trouble breathing. You have trouble swallowing. You have muscle pain along with a stiff neck, fever, and vomiting. You have severe muscle weakness or cannot move part of your body. You lose control of your bowels or bladder.  RESOURCE GUIDE  Chronic Pain Problems: Contact [facility]  [phone] Patients need to be referred by their primary care doctor.  Insufficient Money for Medicine: Contact [employer] Way:  call "211" or [facility] 431 [phone].  No Primary Care Doctor: Call [facility] Connect  [phone] - can help you locate a primary care doctor that  accepts your insurance, provides certain services,  etc. Physician Referral Service- [phone]  Agencies that provide inexpensive medical care: [facility]  [phone] [facility]  [phone] [facility]  [phone] [facility]  [phone] [facility]  [phone] [facility]  [phone]  Medicaid-accepting [facility] Providers: [facility]- [address], [address]  [phone], Mon-Fri 9am-7pm, Sat 9am-1pm [facility]- [address], [address] [place]  [phone] [facility]- [address], [address] [place]  [phone] [facility]- [address] [place] [address]  [phone] Renaye RakersVeita Bland- [address], [address], [phone]  Only accepts [place] patients after they have their name  applied to their card  Self Pay (no insurance) in [facility]: Sickle Cell Patients: Dr Willey BladeEric Dean, [facility]  [address], [phone] [facility]- [address]  [phone]       Redge Gainer-      [facility]- [address]       -     [facility]- see information above (Speak to CitigroupPam H if you do not have insurance)       -  [facility]- [address], [phone]       -  [facility]- [address],  [phone]       -  [facility]- [address] [place] [address], [phone]       -  Dr Julio Sickssei-Bonsu-  [phone]  [address], [address], [place], [facility]- [address], [phone]       -  [facility]- [address], [place], also [address], [phone]       -    [facility]- [address], [place], 1st & 3rd Saturday   every month, 10am-1pm  1) Find a Doctor and Pay Out of Pocket Although you won't have to find out who is covered by your insurance plan, it is a good idea to ask around and get recommendations. You will then need to call the  office and see if the doctor you have chosen will accept you as a new patient and what types of options they offer for patients who are self-pay. Some doctors offer discounts or will set up payment plans for their patients who do not have insurance, but you will need to ask so you aren't surprised when you get to your appointment.  2) Contact Your Local Health Department Not all health departments have doctors that can see patients for sick visits, but many do, so it is worth a call to see if yours does. If you don't know where your local health department is, you can check in your phone book. The CDC also has a tool to help you locate your state's health department, and many state websites also have listings of all of their local health departments.  3) Find a [facility] If your illness is not likely to be very severe or complicated, you may want to try a walk in clinic. These are popping up all over the country in pharmacies, drugstores, and shopping centers. They're usually staffed by nurse practitioners or physician assistants that have been trained to treat common illnesses and complaints. They're usually fairly quick and inexpensive. However, if you have serious medical issues or chronic medical problems, these are probably not your best option  [facility] [place], [facility], [address] [place], phone [phone] or [phone].  Monday - Friday, call for an appointment. [place], [facility], [address], [place], phone [phone] or [phone].  Monday - Friday, call for an appointment.  Abuse/Neglect: [place] [phone] [place] [phone] (After Hours)  Emergency Shelter:  [facility] [phone]  Maternity Homes: Room at the [place] [phone] [place] [phone]  MRSA Hotline #:   506 863 3700  [facility] Dept. [address]         [place] Phone:  [phone]  Phone:  [phone]                   Phone:  [place], [place]- [phone]       -     [facility] in [place], [address],                                  [place] [phone] or [phone] (After Hours)   [place]  Substance Abuse Resources: [facility] Drug [facility]  [phone] [place] [phone] [facility] Chinita Pester [phone] Residential & Outpatient Substance Abuse Program  [phone]  Psychological Services: [place]  [phone] [place], [place]. [address] [place], [place]: [phone] or [phone], PicCapture.uy  Dental Assistance  If unable to pay or uninsured, contact:  [facility] or [facility]. to become qualified for the adult dental clinic.  Patients with Medicaid: [facility] [phone], [place] [address], [phone]  If unable to pay, or uninsured, contact [facility] [phone]) or [place] [phone] in [place], [place] in [facility]) to become qualified for the adult dental clinic   Other [place]- [place], [place], [place], [address], [place], [place], 2nd and 4th Thursday of the month  at 6:30am.  10 clients each day by appointment, can sometimes see walk-in patients if someone does not show for an appointment. [facility]- [address] Hillard Danker [place], [place], [address], [place], [place], [place], [address], [place] Department- [phone] [place] [facility] [phone]

## 2017-10-11 NOTE — ED Notes (Signed)
Declined W/C at D/C and was escorted to lobby by RN. 
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
## 2018-05-18 ENCOUNTER — Other Ambulatory Visit: Payer: Self-pay

## 2018-05-18 ENCOUNTER — Ambulatory Visit (INDEPENDENT_AMBULATORY_CARE_PROVIDER_SITE_OTHER): Payer: BLUE CROSS/BLUE SHIELD | Admitting: Family Medicine

## 2018-05-18 ENCOUNTER — Encounter: Payer: Self-pay | Admitting: Family Medicine

## 2018-05-18 VITALS — BP 146/86 | HR 98 | Temp 98.4°F | Ht 60.0 in | Wt 146.0 lb

## 2018-05-18 DIAGNOSIS — Z7689 Persons encountering health services in other specified circumstances: Secondary | ICD-10-CM | POA: Diagnosis not present

## 2018-05-18 DIAGNOSIS — M25431 Effusion, right wrist: Secondary | ICD-10-CM

## 2018-05-18 DIAGNOSIS — G5601 Carpal tunnel syndrome, right upper limb: Secondary | ICD-10-CM | POA: Diagnosis not present

## 2018-05-18 DIAGNOSIS — M25531 Pain in right wrist: Secondary | ICD-10-CM

## 2018-05-18 DIAGNOSIS — N390 Urinary tract infection, site not specified: Secondary | ICD-10-CM

## 2018-05-18 DIAGNOSIS — Z09 Encounter for follow-up examination after completed treatment for conditions other than malignant neoplasm: Secondary | ICD-10-CM

## 2018-05-18 DIAGNOSIS — Z0000 Encounter for general adult medical examination without abnormal findings: Secondary | ICD-10-CM

## 2018-05-18 DIAGNOSIS — R8290 Unspecified abnormal findings in urine: Secondary | ICD-10-CM

## 2018-05-18 DIAGNOSIS — N898 Other specified noninflammatory disorders of vagina: Secondary | ICD-10-CM | POA: Diagnosis not present

## 2018-05-18 DIAGNOSIS — R319 Hematuria, unspecified: Secondary | ICD-10-CM

## 2018-05-18 DIAGNOSIS — T7840XA Allergy, unspecified, initial encounter: Secondary | ICD-10-CM

## 2018-05-18 DIAGNOSIS — Z131 Encounter for screening for diabetes mellitus: Secondary | ICD-10-CM

## 2018-05-18 LAB — POCT URINALYSIS DIP (MANUAL ENTRY)
Bilirubin, UA: NEGATIVE
Glucose, UA: NEGATIVE mg/dL
Ketones, POC UA: NEGATIVE mg/dL
Nitrite, UA: POSITIVE — AB
Protein Ur, POC: NEGATIVE mg/dL
Spec Grav, UA: 1.015 (ref 1.010–1.025)
Urobilinogen, UA: 0.2 E.U./dL
pH, UA: 7.5 (ref 5.0–8.0)

## 2018-05-18 LAB — POCT GLYCOSYLATED HEMOGLOBIN (HGB A1C): Hemoglobin A1C: 5.4 % (ref 4.0–5.6)

## 2018-05-18 MED ORDER — IBUPROFEN-FAMOTIDINE 800-26.6 MG PO TABS: 800.0000 mg | ORAL_TABLET | Freq: Three times a day (TID) | ORAL | 1 refills | Status: DC | PRN

## 2018-05-18 MED ORDER — FLUTICASONE PROPIONATE 50 MCG/ACT NA SUSP: 2.0000 | Freq: Every day | NASAL | 6 refills | Status: DC

## 2018-05-18 MED ORDER — SULFAMETHOXAZOLE-TRIMETHOPRIM 800-160 MG PO TABS: 1.0000 | ORAL_TABLET | Freq: Two times a day (BID) | ORAL | 0 refills | Status: DC

## 2018-05-18 MED ORDER — CETIRIZINE HCL 10 MG PO TABS: 10.0000 mg | ORAL_TABLET | Freq: Every day | ORAL | 11 refills | Status: DC

## 2018-05-18 NOTE — Patient Instructions (Addendum)
Ibuprofen tablets and capsules What is this medicine? IBUPROFEN (eye BYOO proe fen) is a non-steroidal anti-inflammatory drug (NSAID). It is used for dental pain, fever, headaches or migraines, osteoarthritis, rheumatoid arthritis, or painful monthly periods. It can also relieve minor aches and pains caused by a cold, flu, or sore throat. This medicine may be used for other purposes; ask your health care provider or pharmacist if you have questions. COMMON BRAND NAME(S): Advil, Advil Junior Strength, Advil Migraine, Genpril, Ibren, IBU, Midol, Midol Cramps and Body Aches, Motrin, Motrin IB, Motrin Junior Strength, Motrin Migraine Pain, Samson-8, Toxicology Saliva Collection What should I tell my health care provider before I take this medicine? They need to know if you have any of these conditions: -cigarette smoker -coronary artery bypass graft (CABG) surgery within the past 2 weeks -drink more than 3 alcohol-containing drinks a day -heart disease -high blood pressure -history of stomach bleeding -kidney disease -liver disease -lung or breathing disease, like asthma -an unusual or allergic reaction to ibuprofen, aspirin, other NSAIDs, other medicines, foods, dyes, or preservatives -pregnant or trying to get pregnant -breast-feeding How should I use this medicine? Take this medicine by mouth with a glass of water. Follow the directions on the prescription label. Take this medicine with food if your stomach gets upset. Try to not lie down for at least 10 minutes after you take the medicine. Take your medicine at regular intervals. Do not take your medicine more often than directed. A special MedGuide will be given to you by the pharmacist with each prescription and refill. Be sure to read this information carefully each time. Talk to your pediatrician regarding the use of this medicine in children. Special care may be needed. Overdosage: If you think you have taken too much of this medicine  contact a poison control center or emergency room at once. NOTE: This medicine is only for you. Do not share this medicine with others. What if I miss a dose? If you miss a dose, take it as soon as you can. If it is almost time for your next dose, take only that dose. Do not take double or extra doses. What may interact with this medicine? Do not take this medicine with any of the following medications: -cidofovir -ketorolac -methotrexate -pemetrexed This medicine may also interact with the following medications: -alcohol -aspirin -diuretics -lithium -other drugs for inflammation like prednisone -warfarin This list may not describe all possible interactions. Give your health care provider a list of all the medicines, herbs, non-prescription drugs, or dietary supplements you use. Also tell them if you smoke, drink alcohol, or use illegal drugs. Some items may interact with your medicine. What should I watch for while using this medicine? Tell your doctor or healthcare professional if your symptoms do not start to get better or if they get worse. This medicine does not prevent heart attack or stroke. In fact, this medicine may increase the chance of a heart attack or stroke. The chance may increase with longer use of this medicine and in people who have heart disease. If you take aspirin to prevent heart attack or stroke, talk with your doctor or health care professional. Do not take other medicines that contain aspirin, ibuprofen, or naproxen with this medicine. Side effects such as stomach upset, nausea, or ulcers may be more likely to occur. Many medicines available without a prescription should not be taken with this medicine. This medicine can cause ulcers and bleeding in the stomach and intestines at any time  during treatment. Ulcers and bleeding can happen without warning symptoms and can cause death. To reduce your risk, do not smoke cigarettes or drink alcohol while you are taking this  medicine. You may get drowsy or dizzy. Do not drive, use machinery, or do anything that needs mental alertness until you know how this medicine affects you. Do not stand or sit up quickly, especially if you are an older patient. This reduces the risk of dizzy or fainting spells. This medicine can cause you to bleed more easily. Try to avoid damage to your teeth and gums when you brush or floss your teeth. This medicine may be used to treat migraines. If you take migraine medicines for 10 or more days a month, your migraines may get worse. Keep a diary of headache days and medicine use. Contact your healthcare professional if your migraine attacks occur more frequently. What side effects may I notice from receiving this medicine? Side effects that you should report to your doctor or health care professional as soon as possible: -allergic reactions like skin rash, itching or hives, swelling of the face, lips, or tongue -severe stomach pain -signs and symptoms of bleeding such as bloody or black, tarry stools; red or dark-brown urine; spitting up blood or brown material that looks like coffee grounds; red spots on the skin; unusual bruising or bleeding from the eye, gums, or nose -signs and symptoms of a blood clot such as changes in vision; chest pain; severe, sudden headache; trouble speaking; sudden numbness or weakness of the face, arm, or leg -unexplained weight gain or swelling -unusually weak or tired -yellowing of eyes or skin Side effects that usually do not require medical attention (report to your doctor or health care professional if they continue or are bothersome): -bruising -diarrhea -dizziness, drowsiness -headache -nausea, vomiting This list may not describe all possible side effects. Call your doctor for medical advice about side effects. You may report side effects to FDA at 1-800-FDA-1088. Where should I keep my medicine? Keep out of the reach of children. Store at room  temperature between 15 and 30 degrees C (59 and 86 degrees F). Keep container tightly closed. Throw away any unused medicine after the expiration date. NOTE: This sheet is a summary. It may not cover all possible information. If you have questions about this medicine, talk to your doctor, pharmacist, or health care provider.  2019 Elsevier/Gold Standard (2016-10-12 12:43:57)     Muscle Strain A muscle strain is an injury that happens when a muscle is stretched longer than normal. This can happen during a fall, sports, or lifting. This can tear some muscle fibers. Usually, recovery from muscle strain takes 1-2 weeks. Complete healing normally takes 5-6 weeks. This condition is first treated with PRICE therapy. This involves:  Protecting your muscle from being injured again.  Resting your injured muscle.  Icing your injured muscle.  Applying pressure (compression) to your injured muscle. This may be done with a splint or elastic bandage.  Raising (elevating) your injured muscle. Your doctor may also recommend medicine for pain. Follow these instructions at home: If you have a splint:  Wear the splint as told by your doctor. Take it off only as told by your doctor.  Loosen the splint if your fingers or toes tingle, get numb, or turn cold and blue.  Keep the splint clean.  If the splint is not waterproof: ? Do not let it get wet. ? Cover it with a watertight covering when you take  a bath or a shower. Managing pain, stiffness, and swelling   If directed, put ice on your injured area. ? If you have a removable splint, take it off as told by your doctor. ? Put ice in a plastic bag. ? Place a towel between your skin and the bag. ? Leave the ice on for 20 minutes, 2-3 times a day.  Move your fingers or toes often. This helps to avoid stiffness and lessen swelling.  Raise your injured area above the level of your heart while you are sitting or lying down.  Wear an elastic bandage  as told by your doctor. Make sure it is not too tight. General instructions  Take over-the-counter and prescription medicines only as told by your doctor.  Limit your activity. Rest your injured muscle as told by your doctor. Your doctor may say that gentle movements are okay.  If physical therapy was prescribed, do exercises as told by your doctor.  Do not put pressure on any part of the splint until it is fully hardened. This may take many hours.  Do not use any products that contain nicotine or tobacco, such as cigarettes and e-cigarettes. These can delay bone healing. If you need help quitting, ask your doctor.  Warm up before you exercise. This helps to prevent more muscle strains.  Ask your doctor when it is safe to drive if you have a splint.  Keep all follow-up visits as told by your doctor. This is important. Contact a doctor if:  You have more pain or swelling in your injured area. Get help right away if:  You have any of these problems in your injured area: ? You have numbness. ? You have tingling. ? You lose a lot of strength. Summary  A muscle strain is an injury that happens when a muscle is stretched longer than normal.  This condition is first treated with PRICE therapy. This includes protecting, resting, icing, adding pressure, and raising your injury.  Limit your activity. Rest your injured muscle as told by your doctor. Your doctor may say that gentle movements are okay.  Warm up before you exercise. This helps to prevent more muscle strains. This information is not intended to replace advice given to you by your health care provider. Make sure you discuss any questions you have with your health care provider. Document Released: 11/17/2007 Document Revised: 03/16/2016 Document Reviewed: 03/16/2016 Elsevier Interactive Patient Education  2019 [employer] Inc.    Carpal Tunnel Syndrome  Carpal tunnel syndrome is a condition that causes pain in your hand and  arm. The carpal tunnel is a narrow area that is on the palm side of your wrist. Repeated wrist motion or certain diseases may cause swelling in the tunnel. This swelling can pinch the main nerve in the wrist (median nerve). What are the causes? This condition may be caused by:  Repeated wrist motions.  Wrist injuries.  Arthritis.  A sac of fluid (cyst) or abnormal growth (tumor) in the carpal tunnel.  Fluid buildup during pregnancy. Sometimes the cause is not known. What increases the risk? The following factors may make you more likely to develop this condition:  Having a job in which you move your wrist in the same way many times. This includes jobs like being a Midwife or a Conservation officer, nature.  Being a woman.  Having other health conditions, such as: ? Diabetes. ? Obesity. ? A thyroid gland that is not active enough (hypothyroidism). ? Kidney failure. What are the signs  or symptoms? Symptoms of this condition include:  A tingling feeling in your fingers.  Tingling or a loss of feeling (numbness) in your hand.  Pain in your entire arm. This pain may get worse when you bend your wrist and elbow for a long time.  Pain in your wrist that goes up your arm to your shoulder.  Pain that goes down into your palm or fingers.  A weak feeling in your hands. You may find it hard to grab and hold items. You may feel worse at night. How is this diagnosed? This condition is diagnosed with a medical history and physical exam. You may also have tests, such as:  Electromyogram (EMG). This test checks the signals that the nerves send to the muscles.  Nerve conduction study. This test checks how well signals pass through your nerves.  Imaging tests, such as X-rays, ultrasound, and MRI. These tests check for what might be the cause of your condition. How is this treated? This condition may be treated with:  Lifestyle changes. You will be asked to stop or change the activity that caused your  problem.  Doing exercise and activities that make bones and muscles stronger (physical therapy).  Learning how to use your hand again (occupational therapy).  Medicines for pain and swelling (inflammation). You may have injections in your wrist.  A wrist splint.  Surgery. Follow these instructions at home: If you have a splint:  Wear the splint as told by your doctor. Remove it only as told by your doctor.  Loosen the splint if your fingers: ? Tingle. ? Lose feeling (become numb). ? Turn cold and blue.  Keep the splint clean.  If the splint is not waterproof: ? Do not let it get wet. ? Cover it with a watertight covering when you take a bath or a shower. Managing pain, stiffness, and swelling   If told, put ice on the painful area: ? If you have a removable splint, remove it as told by your doctor. ? Put ice in a plastic bag. ? Place a towel between your skin and the bag. ? Leave the ice on for 20 minutes, 2-3 times per day. General instructions  Take over-the-counter and prescription medicines only as told by your doctor.  Rest your wrist from any activity that may cause pain. If needed, talk with your boss at work about changes that can help your wrist heal.  Do any exercises as told by your doctor, physical therapist, or occupational therapist.  Keep all follow-up visits as told by your doctor. This is important. Contact a doctor if:  You have new symptoms.  Medicine does not help your pain.  Your symptoms get worse. Get help right away if:  You have very bad numbness or tingling in your wrist or hand. Summary  Carpal tunnel syndrome is a condition that causes pain in your hand and arm.  It is often caused by repeated wrist motions.  Lifestyle changes and medicines are used to treat this problem. Surgery may help in very bad cases.  Follow your doctor's instructions about wearing a splint, resting your wrist, keeping follow-up visits, and calling for  help. This information is not intended to replace advice given to you by your health care provider. Make sure you discuss any questions you have with your health care provider. Document Released: 01/27/2011 Document Revised: 06/16/2017 Document Reviewed: 06/16/2017 Elsevier Interactive Patient Education  2019 [employer]. Famotidine; Ibuprofen oral tablets What is this medicine? FAMOTIDINE; IBUPROFEN (  fa MOE ti deen; eye BYOO proe fen) is two medicines together. Ibuprofen is a non-steroidal anti-inflammatory drug (NSAID). It is used for arthritis pain. Famotidine is a type of antihistamine that blocks the release of stomach acid. It is used to prevent stomach problems from the ibuprofen. This medicine may be used for other purposes; ask your health care provider or pharmacist if you have questions. COMMON BRAND NAME(S): DUEXIS What should I tell my health care provider before I take this medicine? They need to know if you have any of these conditions: -asthma -cigarette smoker -drink more than 3 alcohol containing drinks a day -heart disease -high blood pressure -history of stomach bleeding -kidney disease -liver disease -stomach or intestine problems -an unusual or allergic reaction to famotidine, ibuprofen, other medicines, foods, dyes, or preservatives -pregnant or trying to get pregnant -breast-feeding How should I use this medicine? Take this medicine by mouth with a glass of water. Follow the directions on the prescription label. Do not cut, crush or chew this medicine. You can take it with or without food. If it upsets your stomach, take it with food. Take your medicine at regular intervals. Do not take it more often than directed. Do not stop taking except on your doctor's advice. A special MedGuide will be given to you by the pharmacist with each prescription and refill. Be sure to read this information carefully each time. Talk to your pediatrician regarding the use of this  medicine in children. Special care may be needed. Overdosage: If you think you have taken too much of this medicine contact a poison control center or emergency room at once. NOTE: This medicine is only for you. Do not share this medicine with others. What if I miss a dose? If you miss a dose, take it as soon as you can. If it is almost time for your next dose, take only that dose. Do not take double or extra doses. What may interact with this medicine? Do not take this medicine with any of the following medications: -cidofovir -ketorolac -methotrexate This medicine may also interact with the following medications: -alcohol -aspirin -certain medicines for blood pressure, heart disease, irregular heart beat -certain medicines for depression, anxiety, or psychotic disturbances -cholestyramine -diuretics -lithium -steroid medicines like prednisone or cortisone -supplements like feverfew, flavocoxid, garlic, ginger, ginkgo, and methylsulfonylmethane, MSM -warfarin This list may not describe all possible interactions. Give your health care provider a list of all the medicines, herbs, non-prescription drugs, or dietary supplements you use. Also tell them if you smoke, drink alcohol, or use illegal drugs. Some items may interact with your medicine. What should I watch for while using this medicine? Tell your doctor or healthcare professional if your symptoms do not start to get better or if they get worse. Do not take other medicines that contain aspirin, ibuprofen, or naproxen with this medicine. Side effects such as stomach upset, nausea, or ulcers may be more likely to occur. Many medicines available without a prescription should not be taken with this medicine. This medicine can cause ulcers and bleeding in the stomach and intestines at any time during treatment. This can happen with no warning and may cause death. There is increased risk with taking this medicine for a long time. Smoking,  drinking alcohol, older age, and poor health can also increase risks. Call your doctor right away if you have stomach pain or blood in your vomit or stool. This medicine does not prevent heart attack or stroke. In fact, this  medicine may increase the chance of a heart attack or stroke. The chance may increase with longer use of this medicine and in people who have heart disease. If you take aspirin to prevent heart attack or stroke, talk with your doctor or health care professional. This medicine may increase your risk to bruise or bleed. Call your doctor or health care professional if you notice any unusual bleeding. This medicine may cause a decrease in vitamin B12. You should make sure that you get enough vitamin B12 while you are taking this medicine. Discuss the foods you eat and the vitamins you take with your health care professional. What side effects may I notice from receiving this medicine? Side effects that you should report to your doctor or health care professional as soon as possible: -allergic reactions like skin rash, itching or hives, swelling of the face, lips, or tongue -severe stomach pain -signs and symptoms of bleeding such as bloody or black, tarry stools; red or dark-brown urine; spitting up blood or brown material that looks like coffee grounds; red spots on the skin; unusual bruising or bleeding from the eye, gums, or nose -signs and symptoms of a blood clot such as changes in vision; chest pain; severe, sudden headache; trouble speaking; sudden numbness or weakness of the face, arm, or leg -unexplained weight gain or swelling -unusually weak or tired -yellowing of the eyes or skin Side effects that usually do not require medical attention (report to your doctor or health care professional if they continue or are bothersome): -constipation -diarrhea -dizziness, drowsiness -headache -nausea, vomiting This list may not describe all possible side effects. Call your doctor  for medical advice about side effects. You may report side effects to FDA at 1-800-FDA-1088. Where should I keep my medicine? Keep out of the reach of children. Store at room temperature between 15 and 30 degrees C (59 and 86 degrees F). Throw away any unused medicine after the expiration date. NOTE: This sheet is a summary. It may not cover all possible information. If you have questions about this medicine, talk to your doctor, pharmacist, or health care provider.  2019 Elsevier/Gold Standard (2016-09-23 13:18:28)      Urinary Tract Infection, Adult A urinary tract infection (UTI) is an infection of any part of the urinary tract. The urinary tract includes:  The kidneys.  The ureters.  The bladder.  The urethra. These organs make, store, and get rid of pee (urine) in the body. What are the causes? This is caused by germs (bacteria) in your genital area. These germs grow and cause swelling (inflammation) of your urinary tract. What increases the risk? You are more likely to develop this condition if:  You have a small, thin tube (catheter) to drain pee.  You cannot control when you pee or poop (incontinence).  You are female, and: ? You use these methods to prevent pregnancy: ? A medicine that kills sperm (spermicide). ? A device that blocks sperm (diaphragm). ? You have low levels of a female hormone (estrogen). ? You are pregnant.  You have genes that add to your risk.  You are sexually active.  You take antibiotic medicines.  You have trouble peeing because of: ? A prostate that is bigger than normal, if you are female. ? A blockage in the part of your body that drains pee from the bladder (urethra). ? A kidney stone. ? A nerve condition that affects your bladder (neurogenic bladder). ? Not getting enough to drink. ? Not peeing  often enough.  You have other conditions, such as: ? Diabetes. ? A weak disease-fighting system (immune system). ? Sickle cell  disease. ? Gout. ? Injury of the spine. What are the signs or symptoms? Symptoms of this condition include:  Needing to pee right away (urgently).  Peeing often.  Peeing small amounts often.  Pain or burning when peeing.  Blood in the pee.  Pee that smells bad or not like normal.  Trouble peeing.  Pee that is cloudy.  Fluid coming from the vagina, if you are female.  Pain in the belly or lower back. Other symptoms include:  Throwing up (vomiting).  No urge to eat.  Feeling mixed up (confused).  Being tired and grouchy (irritable).  A fever.  Watery poop (diarrhea). How is this treated? This condition may be treated with:  Antibiotic medicine.  Other medicines.  Drinking enough water. Follow these instructions at home:  Medicines  Take over-the-counter and prescription medicines only as told by your doctor.  If you were prescribed an antibiotic medicine, take it as told by your doctor. Do not stop taking it even if you start to feel better. General instructions  Make sure you: ? Pee until your bladder is empty. ? Do not hold pee for a long time. ? Empty your bladder after sex. ? Wipe from front to back after pooping if you are a female. Use each tissue one time when you wipe.  Drink enough fluid to keep your pee pale yellow.  Keep all follow-up visits as told by your doctor. This is important. Contact a doctor if:  You do not get better after 1-2 days.  Your symptoms go away and then come back. Get help right away if:  You have very bad back pain.  You have very bad pain in your lower belly.  You have a fever.  You are sick to your stomach (nauseous).  You are throwing up. Summary  A urinary tract infection (UTI) is an infection of any part of the urinary tract.  This condition is caused by germs in your genital area.  There are many risk factors for a UTI. These include having a small, thin tube to drain pee and not being able to  control when you pee or poop.  Treatment includes antibiotic medicines for germs.  Drink enough fluid to keep your pee pale yellow. This information is not intended to replace advice given to you by your health care provider. Make sure you discuss any questions you have with your health care provider. Document Released: 07/27/2007 Document Revised: 08/17/2017 Document Reviewed: 08/17/2017 Elsevier Interactive Patient Education  2019 Else[employer] Inc.    Sulfamethoxazole; Trimethoprim, SMX-TMP tablets What is this medicine? SULFAMETHOXAZOLE; TRIMETHOPRIM or SMX-TMP (suhl fuh meth OK suh zohl; trye METH oh prim) is a combination of a sulfonamide antibiotic and a second antibiotic, trimethoprim. It is used to treat or prevent certain kinds of bacterial infections. It will not work for colds, flu, or other viral infections. This medicine may be used for other purposes; ask your health care provider or pharmacist if you have questions. COMMON BRAND NAME(S): Bacter-Aid DS, Bactrim, Bactrim DS, Septra, Septra DS What should I tell my health care provider before I take this medicine? They need to know if you have any of these conditions: -anemia -asthma -being treated with anticonvulsants -if you frequently drink alcohol containing drinks -kidney disease -liver disease -low level of folic acid or [id number]-6-phosphate dehydrogenase -poor nutrition or malabsorption -porphyria -severe allergies -  thyroid disorder -an unusual or allergic reaction to sulfamethoxazole, trimethoprim, sulfa drugs, other medicines, foods, dyes, or preservatives -pregnant or trying to get pregnant -breast-feeding How should I use this medicine? Take this medicine by mouth with a full glass of water. Follow the directions on the prescription label. Take your medicine at regular intervals. Do not take it more often than directed. Do not skip doses or stop your medicine early. Talk to your pediatrician regarding the use of  this medicine in children. Special care may be needed. This medicine has been used in children as young as 28 months of age. Overdosage: If you think you have taken too much of this medicine contact a poison control center or emergency room at once. NOTE: This medicine is only for you. Do not share this medicine with others. What if I miss a dose? If you miss a dose, take it as soon as you can. If it is almost time for your next dose, take only that dose. Do not take double or extra doses. What may interact with this medicine? Do not take this medicine with any of the following medications: -aminobenzoate potassium -dofetilide -metronidazole This medicine may also interact with the following medications: -ACE inhibitors like benazepril, enalapril, lisinopril, and ramipril -birth control pills -cyclosporine -digoxin -diuretics -indomethacin -medicines for diabetes -methenamine -methotrexate -phenytoin -potassium supplements -pyrimethamine -sulfinpyrazone -tricyclic antidepressants -warfarin This list may not describe all possible interactions. Give your health care provider a list of all the medicines, herbs, non-prescription drugs, or dietary supplements you use. Also tell them if you smoke, drink alcohol, or use illegal drugs. Some items may interact with your medicine. What should I watch for while using this medicine? Tell your doctor or health care professional if your symptoms do not improve. Drink several glasses of water a day to reduce the risk of kidney problems. Do not treat diarrhea with over the counter products. Contact your doctor if you have diarrhea that lasts more than 2 days or if it is severe and watery. This medicine can make you more sensitive to the sun. Keep out of the sun. If you cannot avoid being in the sun, wear protective clothing and use a sunscreen. Do not use sun lamps or tanning beds/booths. What side effects may I notice from receiving this medicine?  Side effects that you should report to your doctor or health care professional as soon as possible: -allergic reactions like skin rash or hives, swelling of the face, lips, or tongue -breathing problems -fever or chills, sore throat -irregular heartbeat, chest pain -joint or muscle pain -pain or difficulty passing urine -red pinpoint spots on skin -redness, blistering, peeling or loosening of the skin, including inside the mouth -unusual bleeding or bruising -unusually weak or tired -yellowing of the eyes or skin Side effects that usually do not require medical attention (report to your doctor or health care professional if they continue or are bothersome): -diarrhea -dizziness -headache -loss of appetite -nausea, vomiting -nervousness This list may not describe all possible side effects. Call your doctor for medical advice about side effects. You may report side effects to FDA at 1-800-FDA-1088. Where should I keep my medicine? Keep out of the reach of children. Store at room temperature between 20 to 25 degrees C (68 to 77 degrees F). Protect from light. Throw away any unused medicine after the expiration date. NOTE: This sheet is a summary. It may not cover all possible information. If you have questions about this medicine, talk to your  doctor, pharmacist, or health care provider.  2019 Elsevier/Gold Standard (2012-09-14 14:38:26)  Cetirizine tablets What is this medicine? CETIRIZINE (se TI ra zeen) is an antihistamine. This medicine is used to treat or prevent symptoms of allergies. It is also used to help reduce itchy skin rash and hives. This medicine may be used for other purposes; ask your health care provider or pharmacist if you have questions. COMMON BRAND NAME(S): All Day Allergy, Allergy Relief, Zyrtec, Zyrtec Hives Relief What should I tell my health care provider before I take this medicine? They need to know if you have any of these conditions: -kidney disease  -liver disease -an unusual or allergic reaction to cetirizine, hydroxyzine, other medicines, foods, dyes, or preservatives -pregnant or trying to get pregnant -breast-feeding How should I use this medicine? Take this medicine by mouth with a glass of water. Follow the directions on the prescription label. You can take this medicine with food or on an empty stomach. Take your medicine at regular times. Do not take more often than directed. You may need to take this medicine for several days before your symptoms improve. Talk to your pediatrician regarding the use of this medicine in children. Special care may be needed. While this drug may be prescribed for children as young as 21 years of age for selected conditions, precautions do apply. Overdosage: If you think you have taken too much of this medicine contact a poison control center or emergency room at once. NOTE: This medicine is only for you. Do not share this medicine with others. What if I miss a dose? If you miss a dose, take it as soon as you can. If it is almost time for your next dose, take only that dose. Do not take double or extra doses. What may interact with this medicine? -alcohol -certain medicines for anxiety or sleep -narcotic medicines for pain -other medicines for colds or allergies This list may not describe all possible interactions. Give your health care provider a list of all the medicines, herbs, non-prescription drugs, or dietary supplements you use. Also tell them if you smoke, drink alcohol, or use illegal drugs. Some items may interact with your medicine. What should I watch for while using this medicine? Visit your doctor or health care professional for regular checks on your health. Tell your doctor if your symptoms do not improve. You may get drowsy or dizzy. Do not drive, use machinery, or do anything that needs mental alertness until you know how this medicine affects you. Do not stand or sit up quickly,  especially if you are an older patient. This reduces the risk of dizzy or fainting spells. Your mouth may get dry. Chewing sugarless gum or sucking hard candy, and drinking plenty of water may help. Contact your doctor if the problem does not go away or is severe. What side effects may I notice from receiving this medicine? Side effects that you should report to your doctor or health care professional as soon as possible: -allergic reactions like skin rash, itching or hives, swelling of the face, lips, or tongue -changes in vision or hearing -fast or irregular heartbeat -trouble passing urine or change in the amount of urine Side effects that usually do not require medical attention (report to your doctor or health care professional if they continue or are bothersome): -dizziness -dry mouth -irritability -sore throat -stomach pain -tiredness This list may not describe all possible side effects. Call your doctor for medical advice about side effects. You may  report side effects to FDA at 1-800-FDA-1088. Where should I keep my medicine? Keep out of the reach of children. Store at room temperature between 15 and 30 degrees C (59 and 86 degrees F). Throw away any unused medicine after the expiration date. NOTE: This sheet is a summary. It may not cover all possible information. If you have questions about this medicine, talk to your doctor, pharmacist, or health care provider.  2019 Elsevier/Gold Standard (2014-03-04 13:44:42)    Fluticasone nasal spray What is this medicine? FLUTICASONE (floo TIK a sone) is a corticosteroid. This medicine is used to treat the symptoms of allergies like sneezing, itchy red eyes, and itchy, runny, or stuffy nose. This medicine is also used to treat nasal polyps. This medicine may be used for other purposes; ask your health care provider or pharmacist if you have questions. COMMON BRAND NAME(S): ClariSpray, Flonase, Flonase Allergy Relief, Flonase Sensimist,  Veramyst, XHANCE What should I tell my health care provider before I take this medicine? They need to know if you have any of these conditions: -eye disease, vision problems -infection, like tuberculosis, herpes, or fungal infection -recent surgery on nose or sinuses -taking a corticosteroid by mouth -an unusual or allergic reaction to fluticasone, steroids, other medicines, foods, dyes, or preservatives -pregnant or trying to get pregnant -breast-feeding How should I use this medicine? This medicine is for use in the nose. Follow the directions on your product or prescription label. This medicine works best if used at regular intervals. Do not use more often than directed. Make sure that you are using your nasal spray correctly. After 6 months of daily use for allergies, talk to your doctor or health care professional before using it for a longer time. Ask your doctor or health care professional if you have any questions. Talk to your pediatrician regarding the use of this medicine in children. Special care may be needed. Some products have been used for allergies in children as young as 2 years. After 2 months of daily use without a prescription in a child, talk to your pediatrician before using it for a longer time. Use of this medicine for nasal polyps is not approved in children. Overdosage: If you think you have taken too much of this medicine contact a poison control center or emergency room at once. NOTE: This medicine is only for you. Do not share this medicine with others. What if I miss a dose? If you miss a dose, use it as soon as you remember. If it is almost time for your next dose, use only that dose and continue with your regular schedule. Do not use double or extra doses. What may interact with this medicine? -certain antibiotics like clarithromycin and telithromycin -certain medicines for fungal infections like ketoconazole, itraconazole, and voriconazole -conivaptan  -nefazodone -some medicines for HIV -vaccines This list may not describe all possible interactions. Give your health care provider a list of all the medicines, herbs, non-prescription drugs, or dietary supplements you use. Also tell them if you smoke, drink alcohol, or use illegal drugs. Some items may interact with your medicine. What should I watch for while using this medicine? Visit your healthcare professional for regular checks on your progress. Tell your healthcare professional if your symptoms do not start to get better or if they get worse. This medicine may increase your risk of getting an infection. Tell your doctor or health care professional if you are around anyone with measles or chickenpox, or if you develop sores  or blisters that do not heal properly. What side effects may I notice from receiving this medicine? Side effects that you should report to your doctor or health care professional as soon as possible: -allergic reactions like skin rash, itching or hives, swelling of the face, lips, or tongue -changes in vision -crusting or sores in the nose -nosebleed -signs and symptoms of infection like fever or chills; cough; sore throat -white patches or sores in the mouth or nose Side effects that usually do not require medical attention (report to your doctor or health care professional if they continue or are bothersome): -burning or irritation inside the nose or throat -changes in taste or smell -cough -headache This list may not describe all possible side effects. Call your doctor for medical advice about side effects. You may report side effects to FDA at 1-800-FDA-1088. Where should I keep my medicine? Keep out of the reach of children. Store at room temperature between 15 and 30 degrees C (59 and 86 degrees F). Avoid exposure to extreme heat, cold, or light. Throw away any unused medicine after the expiration date. NOTE: This sheet is a summary. It may not cover all  possible information. If you have questions about this medicine, talk to your doctor, pharmacist, or health care provider.  2019 Elsevier/Gold Standard (2017-03-02 14:10:08)

## 2018-05-18 NOTE — Progress Notes (Signed)
[facility]  [facility]    Patient ID: Rachel Fischer, female    DOB: [birth year]  Age: 48 y.o. MRN: [id number]  CC:  Chief Complaint  Patient presents with   Establish Care   Vaginal Discharge   Vaginal odor   Carpal Tunnel    bilateral hands     HPI Rachel Fischer is a 48 year old female who presents for re-Establish Care today.    Past Medical History:  Diagnosis Date   Anemia    Colon polyps    Depression    has not been diagnosed   Hx of trichomoniasis    Hypertension    not currently taking meds due to financial reasons   Lung nodule    Migraine headache    migraines; gets them 4/month   Tobacco use     Current Status: Since her last office visit, she has had vaginal odor and discharge X 4 months. She has not taken any medication for relief. She denies urinary frequency, dysuria, urinary itching, burning, hematuria, and suprapubic pain/discomfort. She denies visual changes, chest pain, cough, shortness of breath, heart palpitations, and falls. She has occasional headaches and dizziness with position changes. Denies severe headaches, confusion, seizures, double vision, and blurred vision, nausea and vomiting. Her anxiety is mild today. She denies suicidal ideations, homicidal ideations, or auditory hallucinations. Her headaches are stable today. She is currently taking Motrin for relief of symptoms. She is currently smoking 10 cigarettes a day. She has been smoking now for 26 years. Today she is having pain in her right hand. She has a occupation history of 'hair-braiding.' she is currently using 'wrist sleeve' for support. She had a history of Colonoscopy r/t polys and history of blood in stools. She reports occasional blood in her stools. No reports of GI problems such as diarrhea, and constipation. She has no reports of dysuria and hematuria. She denies fevers, chills, fatigue, recent  infections, weight loss, and night sweats.  Past Surgical History:  Procedure Laterality Date   ABDOMINAL HYSTERECTOMY     ARM NEUROPLASTY Left    c section x 4     CESAREAN SECTION     FOOT SURGERY      Family History  Problem Relation Age of Onset   Breast cancer Mother 22   Hypertension Mother    Hyperlipidemia Mother    CAD Mother     Social History   Socioeconomic History   Marital status: Single    Spouse name: Not on file   Number of children: 4   Years of education: Not on file   Highest education level: Not on file  Occupational History   Occupation: unemployed    Associate Professor: UNEMPLOYED  Social Network engineer strain: Not on file   Food insecurity:    Worry: Not on file    Inability: Not on file   Transportation needs:    Medical: Not on file    Non-medical: Not on file  Tobacco Use   Smoking status: Current Every Day Smoker    Packs/day: 0.50    Years: 19.00    Pack years: 9.50    Types: Cigarettes   Smokeless tobacco: Never Used  Substance and Sexual Activity   Alcohol use: No   Drug use: Yes    Types: Marijuana   Sexual activity: Yes    Birth control/protection: None  Lifestyle   Physical activity:  Days per week: Not on file    Minutes per session: Not on file   Stress: Not on file  Relationships   Social connections:    Talks on phone: Not on file    Gets together: Not on file    Attends religious service: Not on file    Active member of club or organization: Not on file    Attends meetings of clubs or organizations: Not on file    Relationship status: Not on file   Intimate partner violence:    Fear of current or ex partner: Not on file    Emotionally abused: Not on file    Physically abused: Not on file    Forced sexual activity: Not on file  Other Topics Concern   Not on file  Social History Narrative   Children: 3 boys and 1 girl.     Outpatient Medications Prior to Visit  Medication  Sig Dispense Refill   lisinopril-hydrochlorothiazide (PRINZIDE,ZESTORETIC) 20-25 MG tablet Take 1 tablet by mouth daily.     ibuprofen (ADVIL,MOTRIN) 400 MG tablet Take 1 tablet (400 mg total) by mouth every 6 (six) hours as needed. (Patient not taking: Reported on 05/18/2018) 30 tablet 0   methocarbamol (ROBAXIN) 500 MG tablet Take 1 tablet (500 mg total) by mouth 2 (two) times daily. (Patient not taking: Reported on 05/18/2018) 10 tablet 0   nitrofurantoin, macrocrystal-monohydrate, (MACROBID) 100 MG capsule Take 1 capsule (100 mg total) by mouth 2 (two) times daily. 10 capsule 0   No facility-administered medications prior to visit.     Allergies  Allergen Reactions   Ciprofloxacin Other (See Comments)    Generalized muscle and joint pain.   Morphine And Related Itching and Nausea And Vomiting   Penicillins Hives    Has patient had a PCN reaction causing immediate rash, facial/tongue/throat swelling, SOB or lightheadedness with hypotension: No Has patient had a PCN reaction causing severe rash involving mucus membranes or skin necrosis: No Has patient had a PCN reaction that required hospitalization No Has patient had a PCN reaction occurring within the last 10 years: No If all of the above answers are "NO", then may proceed with Cephalosporin use.     ROS Review of Systems  Constitutional: Negative.   HENT: Negative.   Eyes: Negative.   Respiratory: Negative.   Cardiovascular: Negative.   Gastrointestinal: Positive for blood in stool (occasional ).  Endocrine: Negative.   Genitourinary: Positive for vaginal discharge.  Musculoskeletal: Positive for arthralgias (right wrist).  Skin: Negative.   Allergic/Immunologic: Negative.   Neurological: Positive for dizziness and headaches.  Hematological: Negative.   Psychiatric/Behavioral: Negative.    Objective:    Physical Exam  Constitutional: She is oriented to person, place, and time. She appears well-developed and  well-nourished.  HENT:  Head: Normocephalic and atraumatic.  Eyes: Conjunctivae are normal.  Neck: Normal range of motion. Neck supple.  Cardiovascular: Normal rate, regular rhythm, normal heart sounds and intact distal pulses.  Pulmonary/Chest: Effort normal and breath sounds normal.  Abdominal: Soft. Bowel sounds are normal.  Musculoskeletal: Normal range of motion.  Neurological: She is alert and oriented to person, place, and time. She has normal reflexes.  Skin: Skin is warm and dry.  Psychiatric: She has a normal mood and affect. Her behavior is normal. Judgment and thought content normal.  Nursing note and vitals reviewed.   BP (!) 146/86 (BP Location: Left Arm, Patient Position: Sitting, Cuff Size: Small)    Pulse 98  Temp 98.4 F (36.9 C) (Oral)    Ht 5' (1.524 m)    Wt 146 lb (66.2 kg)    SpO2 100%    BMI 28.51 kg/m  Wt Readings from Last 3 Encounters:  05/18/18 146 lb (66.2 kg)  04/10/17 137 lb (62.1 kg)  02/25/17 134 lb (60.8 kg)   Health Maintenance Due  Topic Date Due   HIV Screening  08/14/1987   PAP SMEAR-Modifier  08/13/1993    There are no preventive care reminders to display for this patient.  Lab Results  Component Value Date   TSH 0.806 08/06/2015   Lab Results  Component Value Date   WBC 6.7 04/17/2017   HGB 13.3 04/17/2017   HCT 40.3 04/17/2017   MCV 86.1 04/17/2017   PLT 383 04/17/2017   Lab Results  Component Value Date   NA 135 04/17/2017   K 3.2 (L) 04/17/2017   CO2 24 04/17/2017   GLUCOSE 123 (H) 04/17/2017   BUN 7 04/17/2017   CREATININE 0.62 04/17/2017   BILITOT 0.4 04/10/2017   ALKPHOS 62 04/10/2017   AST 22 04/10/2017   ALT 15 04/10/2017   PROT 7.2 04/10/2017   ALBUMIN 4.1 04/10/2017   CALCIUM 9.2 04/17/2017   ANIONGAP 13 04/17/2017   Lab Results  Component Value Date   CHOL 149 08/06/2015   Lab Results  Component Value Date   HDL 41 08/06/2015   Lab Results  Component Value Date   LDLCALC 98 08/06/2015   Lab  Results  Component Value Date   TRIG 52 08/06/2015   Lab Results  Component Value Date   CHOLHDL 3.6 08/06/2015   Lab Results  Component Value Date   HGBA1C 5.4 05/18/2018   Assessment & Plan:   1. Encounter to establish care  2. Vaginal discharge - NuSwab Vaginitis Plus (VG+)  3. Vaginal odor Results are pending.  - NuSwab Vaginitis Plus (VG+)  4. Pain and swelling of right wrist - Ibuprofen-Famotidine 800-26.6 MG TABS; Take 800 mg by mouth every 8 (eight) hours as needed.  Dispense: 90 tablet; Refill: 1  5. Carpal tunnel syndrome of right wrist We will initiate Duexis today. Possible referral to orthopedic  - Ibuprofen-Famotidine 800-26.6 MG TABS; Take 800 mg by mouth every 8 (eight) hours as needed.  Dispense: 90 tablet; Refill: 1  6. Allergic state, initial encounter We will initiate Zyrtec today.  - cetirizine (ZYRTEC) 10 MG tablet; Take 1 tablet (10 mg total) by mouth daily.  Dispense: 30 tablet; Refill: 11 - fluticasone (FLONASE) 50 MCG/ACT nasal spray; Place 2 sprays into both nostrils daily.  Dispense: 16 g; Refill: 6  7. Screening for diabetes mellitus Hgb A1c is within normal range of 5.4 today.  She will continue to decrease foods/beverages high in sugars and carbs and follow Heart Healthy or DASH diet. Increase physical activity to at least 30 minutes cardio exercise daily.  - POCT glycosylated hemoglobin (Hb A1C) - POCT urinalysis dipstick  8. Abnormal urinalysis Results are pending.  - Urine Culture  9. Urinary tract infection with hematuria, site unspecified We will initiate Septra today.  - sulfamethoxazole-trimethoprim (BACTRIM DS,SEPTRA DS) 800-160 MG tablet; Take 1 tablet by mouth 2 (two) times daily.  Dispense: 14 tablet; Refill: 0  10. Healthcare maintenance - Comprehensive metabolic panel - TSH - Lipid Panel - Vitamin D, 25-hydroxy - Vitamin B12  11. Follow up She will follow up in 1 month. We will discuss Mammogram, Lung Nodule, and  Colonoscopy at next  office visit.   Problem List Items Addressed This Visit    None    Visit Diagnoses    Encounter to establish care    -  Primary   Vaginal discharge       Relevant Orders   NuSwab Vaginitis Plus (VG+)   Vaginal odor       Relevant Orders   NuSwab Vaginitis Plus (VG+)   Pain and swelling of right wrist       Relevant Medications   Ibuprofen-Famotidine 800-26.6 MG TABS   Carpal tunnel syndrome of right wrist       Relevant Medications   Ibuprofen-Famotidine 800-26.6 MG TABS   Allergic state, initial encounter       Relevant Medications   cetirizine (ZYRTEC) 10 MG tablet   fluticasone (FLONASE) 50 MCG/ACT nasal spray   Screening for diabetes mellitus       Relevant Orders   POCT glycosylated hemoglobin (Hb A1C) (Completed)   POCT urinalysis dipstick (Completed)   Abnormal urinalysis       Relevant Orders   Urine Culture   Urinary tract infection with hematuria, site unspecified       Relevant Medications   sulfamethoxazole-trimethoprim (BACTRIM DS,SEPTRA DS) 800-160 MG tablet   Healthcare maintenance       Relevant Orders   Comprehensive metabolic panel   TSH   Lipid Panel   Vitamin D, 25-hydroxy   Vitamin B12   Follow up          Meds ordered this encounter  Medications   Ibuprofen-Famotidine 800-26.6 MG TABS    Sig: Take 800 mg by mouth every 8 (eight) hours as needed.    Dispense:  90 tablet    Refill:  1   sulfamethoxazole-trimethoprim (BACTRIM DS,SEPTRA DS) 800-160 MG tablet    Sig: Take 1 tablet by mouth 2 (two) times daily.    Dispense:  14 tablet    Refill:  0   cetirizine (ZYRTEC) 10 MG tablet    Sig: Take 1 tablet (10 mg total) by mouth daily.    Dispense:  30 tablet    Refill:  11   fluticasone (FLONASE) 50 MCG/ACT nasal spray    Sig: Place 2 sprays into both nostrils daily.    Dispense:  16 g    Refill:  6    Follow-up: Return in about 1 month (around 06/18/2018).    Kallie Locks, FNP

## 2018-05-19 LAB — LIPID PANEL
Chol/HDL Ratio: 4.7 ratio — ABNORMAL HIGH (ref 0.0–4.4)
Cholesterol, Total: 200 mg/dL — ABNORMAL HIGH (ref 100–199)
HDL: 43 mg/dL (ref >39)
LDL Calculated: 127 mg/dL — ABNORMAL HIGH (ref 0–99)
Triglycerides: 151 mg/dL — ABNORMAL HIGH (ref 0–149)
VLDL Cholesterol Cal: 30 mg/dL (ref 5–40)

## 2018-05-19 LAB — COMPREHENSIVE METABOLIC PANEL
ALT: 7 IU/L (ref 0–32)
AST: 16 IU/L (ref 0–40)
Albumin/Globulin Ratio: 1.7 (ref 1.2–2.2)
Albumin: 4.3 g/dL (ref 3.8–4.8)
Alkaline Phosphatase: 66 IU/L (ref 39–117)
BUN/Creatinine Ratio: 9 (ref 9–23)
BUN: 7 mg/dL (ref 6–24)
Bilirubin Total: <0.2 mg/dL (ref 0.0–1.2)
CO2: 24 mmol/L (ref 20–29)
Calcium: 9.2 mg/dL (ref 8.7–10.2)
Chloride: 101 mmol/L (ref 96–106)
Creatinine, Ser: 0.78 mg/dL (ref 0.57–1.00)
GFR calc Af Amer: 106 mL/min/{1.73_m2} (ref >59)
GFR calc non Af Amer: 92 mL/min/{1.73_m2} (ref >59)
Globulin, Total: 2.5 g/dL (ref 1.5–4.5)
Glucose: 92 mg/dL (ref 65–99)
Potassium: 4.3 mmol/L (ref 3.5–5.2)
Sodium: 141 mmol/L (ref 134–144)
Total Protein: 6.8 g/dL (ref 6.0–8.5)

## 2018-05-19 LAB — TSH: TSH: 0.418 u[IU]/mL — ABNORMAL LOW (ref 0.450–4.500)

## 2018-05-19 LAB — VITAMIN B12: Vitamin B-12: 497 pg/mL (ref 232–1245)

## 2018-05-19 LAB — VITAMIN D 25 HYDROXY (VIT D DEFICIENCY, FRACTURES): Vit D, 25-Hydroxy: 4.6 ng/mL — ABNORMAL LOW (ref 30.0–100.0)

## 2018-05-20 ENCOUNTER — Other Ambulatory Visit: Payer: Self-pay | Admitting: Family Medicine

## 2018-05-20 ENCOUNTER — Encounter: Payer: Self-pay | Admitting: Family Medicine

## 2018-05-20 DIAGNOSIS — E559 Vitamin D deficiency, unspecified: Secondary | ICD-10-CM

## 2018-05-20 MED ORDER — VITAMIN D (ERGOCALCIFEROL) 1.25 MG (50000 UNIT) PO CAPS: 50000.0000 [IU] | ORAL_CAPSULE | ORAL | 3 refills | Status: DC

## 2018-05-21 LAB — URINE CULTURE

## 2018-05-22 ENCOUNTER — Telehealth: Payer: Self-pay

## 2018-05-22 ENCOUNTER — Other Ambulatory Visit: Payer: Self-pay

## 2018-05-22 DIAGNOSIS — E559 Vitamin D deficiency, unspecified: Secondary | ICD-10-CM

## 2018-05-22 DIAGNOSIS — G5601 Carpal tunnel syndrome, right upper limb: Secondary | ICD-10-CM

## 2018-05-22 DIAGNOSIS — M25531 Pain in right wrist: Secondary | ICD-10-CM

## 2018-05-22 DIAGNOSIS — M25431 Effusion, right wrist: Secondary | ICD-10-CM

## 2018-05-22 MED ORDER — IBUPROFEN-FAMOTIDINE 800-26.6 MG PO TABS: 800.0000 mg | ORAL_TABLET | Freq: Three times a day (TID) | ORAL | 1 refills | Status: DC | PRN

## 2018-05-22 MED ORDER — VITAMIN D (ERGOCALCIFEROL) 1.25 MG (50000 UNIT) PO CAPS: 50000.0000 [IU] | ORAL_CAPSULE | ORAL | 3 refills | Status: DC

## 2018-05-22 NOTE — Telephone Encounter (Signed)
Patient notified that medication has been resent

## 2018-05-25 LAB — NUSWAB VAGINITIS PLUS (VG+)
Atopobium vaginae: HIGH Score — AB
BVAB 2: HIGH Score — AB
Candida albicans, NAA: POSITIVE — AB
Candida glabrata, NAA: NEGATIVE
Chlamydia trachomatis, NAA: NEGATIVE
Megasphaera 1: HIGH Score — AB
Neisseria gonorrhoeae, NAA: NEGATIVE
Trich vag by NAA: POSITIVE — AB

## 2018-05-28 ENCOUNTER — Other Ambulatory Visit: Payer: Self-pay | Admitting: Family Medicine

## 2018-05-28 DIAGNOSIS — A599 Trichomoniasis, unspecified: Secondary | ICD-10-CM

## 2018-05-28 DIAGNOSIS — B379 Candidiasis, unspecified: Secondary | ICD-10-CM

## 2018-05-28 MED ORDER — FLUCONAZOLE 150 MG PO TABS: 150.0000 mg | ORAL_TABLET | Freq: Once | ORAL | 0 refills | Status: DC

## 2018-05-28 MED ORDER — METRONIDAZOLE 500 MG PO TABS: 2000.0000 mg | ORAL_TABLET | Freq: Once | ORAL | 0 refills | Status: DC

## 2018-05-29 ENCOUNTER — Other Ambulatory Visit: Payer: Self-pay

## 2018-05-29 DIAGNOSIS — A599 Trichomoniasis, unspecified: Secondary | ICD-10-CM

## 2018-05-29 DIAGNOSIS — B379 Candidiasis, unspecified: Secondary | ICD-10-CM

## 2018-05-29 DIAGNOSIS — E559 Vitamin D deficiency, unspecified: Secondary | ICD-10-CM

## 2018-05-29 MED ORDER — METRONIDAZOLE 500 MG PO TABS: 2000.0000 mg | ORAL_TABLET | Freq: Once | ORAL | 0 refills | Status: AC

## 2018-05-29 MED ORDER — FLUCONAZOLE 150 MG PO TABS: 150.0000 mg | ORAL_TABLET | Freq: Once | ORAL | 0 refills | Status: AC

## 2018-05-29 MED ORDER — VITAMIN D (ERGOCALCIFEROL) 1.25 MG (50000 UNIT) PO CAPS: 50000.0000 [IU] | ORAL_CAPSULE | ORAL | 3 refills | Status: DC

## 2018-06-26 ENCOUNTER — Encounter: Payer: Self-pay | Admitting: Family Medicine

## 2018-06-26 ENCOUNTER — Ambulatory Visit (INDEPENDENT_AMBULATORY_CARE_PROVIDER_SITE_OTHER): Payer: BLUE CROSS/BLUE SHIELD | Admitting: Family Medicine

## 2018-06-26 ENCOUNTER — Other Ambulatory Visit: Payer: Self-pay

## 2018-06-26 VITALS — BP 158/96 | HR 70 | Temp 98.0°F | Ht 60.0 in | Wt 143.2 lb

## 2018-06-26 DIAGNOSIS — Z09 Encounter for follow-up examination after completed treatment for conditions other than malignant neoplasm: Secondary | ICD-10-CM

## 2018-06-26 DIAGNOSIS — R51 Headache: Secondary | ICD-10-CM | POA: Diagnosis not present

## 2018-06-26 DIAGNOSIS — R7989 Other specified abnormal findings of blood chemistry: Secondary | ICD-10-CM

## 2018-06-26 DIAGNOSIS — M25531 Pain in right wrist: Secondary | ICD-10-CM

## 2018-06-26 DIAGNOSIS — G5601 Carpal tunnel syndrome, right upper limb: Secondary | ICD-10-CM | POA: Diagnosis not present

## 2018-06-26 DIAGNOSIS — R8290 Unspecified abnormal findings in urine: Secondary | ICD-10-CM

## 2018-06-26 DIAGNOSIS — M25431 Effusion, right wrist: Secondary | ICD-10-CM

## 2018-06-26 DIAGNOSIS — F17200 Nicotine dependence, unspecified, uncomplicated: Secondary | ICD-10-CM

## 2018-06-26 DIAGNOSIS — E559 Vitamin D deficiency, unspecified: Secondary | ICD-10-CM

## 2018-06-26 DIAGNOSIS — I10 Essential (primary) hypertension: Secondary | ICD-10-CM

## 2018-06-26 DIAGNOSIS — N898 Other specified noninflammatory disorders of vagina: Secondary | ICD-10-CM

## 2018-06-26 DIAGNOSIS — Z716 Tobacco abuse counseling: Secondary | ICD-10-CM

## 2018-06-26 DIAGNOSIS — R519 Headache, unspecified: Secondary | ICD-10-CM

## 2018-06-26 DIAGNOSIS — R030 Elevated blood-pressure reading, without diagnosis of hypertension: Secondary | ICD-10-CM

## 2018-06-26 LAB — POCT URINALYSIS DIP (MANUAL ENTRY)
Bilirubin, UA: NEGATIVE
Glucose, UA: NEGATIVE mg/dL
Ketones, POC UA: NEGATIVE mg/dL
Leukocytes, UA: NEGATIVE
Nitrite, UA: NEGATIVE
Spec Grav, UA: 1.02 (ref 1.010–1.025)
Urobilinogen, UA: 0.2 E.U./dL
pH, UA: 7.5 (ref 5.0–8.0)

## 2018-06-26 MED ORDER — IBUPROFEN-FAMOTIDINE 800-26.6 MG PO TABS: 800.0000 mg | ORAL_TABLET | Freq: Three times a day (TID) | ORAL | 1 refills | Status: DC | PRN

## 2018-06-26 MED ORDER — CLONIDINE HCL 0.1 MG PO TABS
0.1000 mg | ORAL_TABLET | Freq: Once | ORAL | Status: AC
  Administered 2018-06-26: 0.1 mg via ORAL

## 2018-06-26 MED ORDER — AMLODIPINE BESYLATE 5 MG PO TABS: 5.0000 mg | ORAL_TABLET | Freq: Every day | ORAL | 3 refills | Status: DC

## 2018-06-26 MED ORDER — VITAMIN D (ERGOCALCIFEROL) 1.25 MG (50000 UNIT) PO CAPS: 50000.0000 [IU] | ORAL_CAPSULE | ORAL | 3 refills | Status: DC

## 2018-06-26 NOTE — Progress Notes (Signed)
[facility]   Established Patient Office Visit  Subjective:  Patient ID: Rachel Fischer, female    DOB: 1970/12/23  Age: 48 y.o. MRN: [id number]  CC:  Chief Complaint  Patient presents with  . Follow-up    Trichomoniasis  . Migraine    HPI Rachel Fischer is a 48 year old female who presents for follow up today.   Past Medical History:  Diagnosis Date  . Anemia   . Colon polyps   . Decreased thyroid stimulating hormone (TSH) level   . Depression    has not been diagnosed  . Hx of trichomoniasis   . Hyperlipidemia   . Hypertension    not currently taking meds due to financial reasons  . Lung nodule   . Migraine headache    migraines; gets them 4/month  . Tobacco use   . Vitamin D deficiency    Current Status: Since her last office visit, she is doing well but has c/o vaginal discharge. She denies urinary frequency, dysuria, urinary itching, burning, odor, hematuria, and suprapubic pain/discomfort.  She denies visual changes, chest pain, cough, shortness of breath, heart palpitations, and falls. She denies fatigue, inability to tolerate cold, unexplained weight gain, dry skin, heart palpitations, coarse hair, decrease thought process, constipation, depression. Her migraine headaches are stable now. She takes OTC pain medications for relief. She has occasional headaches and dizziness with position changes. Denies severe headaches, confusion, seizures, double vision, and blurred vision, nausea and vomiting. She continues to decrease her smoking habit. She is now smoking about 10 cigarettes in 3 days.  Her anxiety is mild today. She denies suicidal ideations, homicidal ideations, or auditory hallucinations. She continues to have moderate wrist pain, r/t carpal tunnel syndrome.   She denies fevers, chills, fatigue, recent infections, weight loss, and night sweats. No reports of GI problems such as diarrhea, and constipation. She  has no reports of blood in stools.   Past Surgical History:  Procedure Laterality Date  . ABDOMINAL HYSTERECTOMY    . ARM NEUROPLASTY Left   . c section x 4    . CESAREAN SECTION    . FOOT SURGERY      Family History  Problem Relation Age of Onset  . Breast cancer Mother 1  . Hypertension Mother   . Hyperlipidemia Mother   . CAD Mother     Social History   Socioeconomic History  . Marital status: Single    Spouse name: Not on file  . Number of children: 4  . Years of education: Not on file  . Highest education level: Not on file  Occupational History  . Occupation: unemployed    Associate Professor: UNEMPLOYED  Social Needs  . Financial resource strain: Not on file  . Food insecurity:    Worry: Not on file    Inability: Not on file  . Transportation needs:    Medical: Not on file    Non-medical: Not on file  Tobacco Use  . Smoking status: Current Every Day Smoker    Packs/day: 0.50    Years: 19.00    Pack years: 9.50    Types: Cigarettes  . Smokeless tobacco: Never Used  Substance and Sexual Activity  . Alcohol use: No  . Drug use: Yes    Types: Marijuana  . Sexual activity: Yes    Birth control/protection: None  Lifestyle  . Physical activity:    Days per week: Not on file  Minutes per session: Not on file  . Stress: Not on file  Relationships  . Social connections:    Talks on phone: Not on file    Gets together: Not on file    Attends religious service: Not on file    Active member of club or organization: Not on file    Attends meetings of clubs or organizations: Not on file    Relationship status: Not on file  . Intimate partner violence:    Fear of current or ex partner: Not on file    Emotionally abused: Not on file    Physically abused: Not on file    Forced sexual activity: Not on file  Other Topics Concern  . Not on file  Social History Narrative   Children: 3 boys and 1 girl.     Outpatient Medications Prior to Visit  Medication Sig  Dispense Refill  . cetirizine (ZYRTEC) 10 MG tablet Take 1 tablet (10 mg total) by mouth daily. 30 tablet 11  . fluticasone (FLONASE) 50 MCG/ACT nasal spray Place 2 sprays into both nostrils daily. (Patient not taking: Reported on 06/26/2018) 16 g 6  . lisinopril-hydrochlorothiazide (PRINZIDE,ZESTORETIC) 20-25 MG tablet Take 1 tablet by mouth daily.    . Ibuprofen-Famotidine 800-26.6 MG TABS Take 800 mg by mouth every 8 (eight) hours as needed. (Patient not taking: Reported on 06/26/2018) 90 tablet 1  . sulfamethoxazole-trimethoprim (BACTRIM DS,SEPTRA DS) 800-160 MG tablet Take 1 tablet by mouth 2 (two) times daily. (Patient not taking: Reported on 06/26/2018) 14 tablet 0  . Vitamin D, Ergocalciferol, (DRISDOL) 1.25 MG (50000 UT) CAPS capsule Take 1 capsule (50,000 Units total) by mouth every 7 (seven) days. (Patient not taking: Reported on 06/26/2018) 5 capsule 3   No facility-administered medications prior to visit.     Allergies  Allergen Reactions  . Ciprofloxacin Other (See Comments)    Generalized muscle and joint pain.  Marland Kitchen Morphine And Related Itching and Nausea And Vomiting  . Penicillins Hives    Has patient had a PCN reaction causing immediate rash, facial/tongue/throat swelling, SOB or lightheadedness with hypotension: No Has patient had a PCN reaction causing severe rash involving mucus membranes or skin necrosis: No Has patient had a PCN reaction that required hospitalization No Has patient had a PCN reaction occurring within the last 10 years: No If all of the above answers are "NO", then may proceed with Cephalosporin use.     ROS Review of Systems  Constitutional: Negative.   HENT: Negative.   Eyes: Negative.   Respiratory: Negative.   Cardiovascular: Negative.   Gastrointestinal: Negative.   Endocrine: Negative.   Genitourinary: Positive for vaginal discharge.  Musculoskeletal: Negative.   Skin: Negative.   Allergic/Immunologic: Negative.   Neurological: Positive for  dizziness and headaches.  Hematological: Negative.   Psychiatric/Behavioral: Negative.     Objective:    Physical Exam  Constitutional: She is oriented to person, place, and time. She appears well-developed and well-nourished.  HENT:  Head: Normocephalic and atraumatic.  Eyes: Conjunctivae are normal.  Neck: Normal range of motion. Neck supple.  Cardiovascular: Normal rate, regular rhythm, normal heart sounds and intact distal pulses.  Pulmonary/Chest: Effort normal and breath sounds normal.  Abdominal: Soft. Bowel sounds are normal.  Musculoskeletal: Normal range of motion.  Neurological: She is alert and oriented to person, place, and time. She has normal reflexes.  Skin: Skin is warm and dry.  Psychiatric: She has a normal mood and affect. Her behavior is normal. Judgment  and thought content normal.  Nursing note and vitals reviewed.   BP (!) 158/96   Pulse 70   Temp 98 F (36.7 C) (Oral)   Ht 5' (1.524 m)   Wt 143 lb 3.2 oz (65 kg)   SpO2 98%   BMI 27.97 kg/m  Wt Readings from Last 3 Encounters:  06/26/18 143 lb 3.2 oz (65 kg)  05/18/18 146 lb (66.2 kg)  04/10/17 137 lb (62.1 kg)     Health Maintenance Due  Topic Date Due  . HIV Screening  08/14/1987  . PAP SMEAR-Modifier  08/13/1993    There are no preventive care reminders to display for this patient.  Lab Results  Component Value Date   TSH 0.418 (L) 05/18/2018   Lab Results  Component Value Date   WBC 6.7 04/17/2017   HGB 13.3 04/17/2017   HCT 40.3 04/17/2017   MCV 86.1 04/17/2017   PLT 383 04/17/2017   Lab Results  Component Value Date   NA 141 05/18/2018   K 4.3 05/18/2018   CO2 24 05/18/2018   GLUCOSE 92 05/18/2018   BUN 7 05/18/2018   CREATININE 0.78 05/18/2018   BILITOT <0.2 05/18/2018   ALKPHOS 66 05/18/2018   AST 16 05/18/2018   ALT 7 05/18/2018   PROT 6.8 05/18/2018   ALBUMIN 4.3 05/18/2018   CALCIUM 9.2 05/18/2018   ANIONGAP 13 04/17/2017   Lab Results  Component Value Date    CHOL 200 (H) 05/18/2018   Lab Results  Component Value Date   HDL 43 05/18/2018   Lab Results  Component Value Date   LDLCALC 127 (H) 05/18/2018   Lab Results  Component Value Date   TRIG 151 (H) 05/18/2018   Lab Results  Component Value Date   CHOLHDL 4.7 (H) 05/18/2018   Lab Results  Component Value Date   HGBA1C 5.4 05/18/2018    Assessment & Plan:   1. Hypertension, unspecified type Blood pressure is stable today. Continue medications as prescribed. She will continue to decrease high sodium intake, excessive alcohol intake, increase potassium intake, smoking cessation, and increase physical activity of at least 30 minutes of cardio activity daily. She will continue to follow Heart Healthy or DASH diet. - amLODipine (NORVASC) 5 MG tablet; Take 1 tablet (5 mg total) by mouth daily.  Dispense: 90 tablet; Refill: 3  2. Elevated blood pressure reading Clonidine given to patient in office today. Blood pressure is stabilized today. She will report to ED if he eperiences severe headaches, confusion, seizures, double vision, and blurred vision, nausea and vomiting. - cloNIDine (CATAPRES) tablet 0.1 mg - amLODipine (NORVASC) 5 MG tablet; Take 1 tablet (5 mg total) by mouth daily.  Dispense: 90 tablet; Refill: 3  3. Intractable headache, unspecified chronicity pattern, unspecified headache type Stable.   4. Carpal tunnel syndrome of right wrist We will initiate Duexis today.  - Ibuprofen-Famotidine 800-26.6 MG TABS; Take 800 mg by mouth every 8 (eight) hours as needed.  Dispense: 90 tablet; Refill: 1  5. Pain and swelling of right wrist - Ibuprofen-Famotidine 800-26.6 MG TABS; Take 800 mg by mouth every 8 (eight) hours as needed.  Dispense: 90 tablet; Refill: 1  6. Vaginal discharge Results are pending.  - NuSwab Vaginitis Plus (VG+)  7. Vitamin D deficiency - Vitamin D, Ergocalciferol, (DRISDOL) 1.25 MG (50000 UT) CAPS capsule; Take 1 capsule (50,000 Units total) by  mouth every 7 (seven) days.  Dispense: 5 capsule; Refill: 3  8. Smoker Improved.  9. Encounter for smoking cessation counseling Patient counseled on the benefits of quitting smoking. She wants to do on her own at this time.   10. Decreased thyroid stimulating hormone (TSH) level We will re-evaluate Thyroid Panel today.  - Thyroid Panel With TSH  11. Abnormal urinalysis Results are pending.  - Urine Culture - NuSwab Vaginitis Plus (VG+)  12. Follow up She will follow up in 4-6 weeks.  - POCT urinalysis dipstick  Current Outpatient Medications on File Prior to Visit  Medication Sig Dispense Refill  . cetirizine (ZYRTEC) 10 MG tablet Take 1 tablet (10 mg total) by mouth daily. 30 tablet 11  . fluticasone (FLONASE) 50 MCG/ACT nasal spray Place 2 sprays into both nostrils daily. (Patient not taking: Reported on 06/26/2018) 16 g 6  . lisinopril-hydrochlorothiazide (PRINZIDE,ZESTORETIC) 20-25 MG tablet Take 1 tablet by mouth daily.     No current facility-administered medications on file prior to visit.     Orders Placed This Encounter  Procedures  . Urine Culture  . NuSwab Vaginitis Plus (VG+)  . Thyroid Panel With TSH  . POCT urinalysis dipstick    Referral Orders  No referral(s) requested today    Raliegh IpNatalie Addilynn Mowrer,  MSN, FNP-C [facility] [address]  [place], [place] [id number] [phone]  Problem List Items Addressed This Visit      Other   Cephalalgia   Relevant Medications   Ibuprofen-Famotidine 800-26.6 MG TABS   amLODipine (NORVASC) 5 MG tablet    Other Visit Diagnoses    Hypertension, unspecified type    -  Primary   Relevant Medications   cloNIDine (CATAPRES) tablet 0.1 mg (Completed)   amLODipine (NORVASC) 5 MG tablet   Elevated blood pressure reading       Relevant Medications   cloNIDine (CATAPRES) tablet 0.1 mg (Completed)   amLODipine (NORVASC) 5 MG tablet   Carpal tunnel syndrome of right wrist        Relevant Medications   Ibuprofen-Famotidine 800-26.6 MG TABS   Pain and swelling of right wrist       Relevant Medications   Ibuprofen-Famotidine 800-26.6 MG TABS   Vaginal discharge       Relevant Orders   NuSwab Vaginitis Plus (VG+)   Vitamin D deficiency       Relevant Medications   Vitamin D, Ergocalciferol, (DRISDOL) 1.25 MG (50000 UT) CAPS capsule   Smoker       Encounter for smoking cessation counseling       Decreased thyroid stimulating hormone (TSH) level       Relevant Orders   Thyroid Panel With TSH   Abnormal urinalysis       Relevant Orders   Urine Culture   NuSwab Vaginitis Plus (VG+)   Follow up       Relevant Orders   POCT urinalysis dipstick (Completed)      Meds ordered this encounter  Medications  . cloNIDine (CATAPRES) tablet 0.1 mg  . Vitamin D, Ergocalciferol, (DRISDOL) 1.25 MG (50000 UT) CAPS capsule    Sig: Take 1 capsule (50,000 Units total) by mouth every 7 (seven) days.    Dispense:  5 capsule    Refill:  3  . Ibuprofen-Famotidine 800-26.6 MG TABS    Sig: Take 800 mg by mouth every 8 (eight) hours as needed.    Dispense:  90 tablet    Refill:  1  . amLODipine (NORVASC) 5 MG tablet    Sig: Take 1 tablet (5  mg total) by mouth daily.    Dispense:  90 tablet    Refill:  3    Follow-up: No follow-ups on file.    Kallie Locks, FNP

## 2018-06-26 NOTE — Patient Instructions (Addendum)
Vaginal Yeast infection, Adult  Vaginal yeast infection is a condition that causes vaginal discharge as well as soreness, swelling, and redness (inflammation) of the vagina. This is a common condition. Some women get this infection frequently. What are the causes? This condition is caused by a change in the normal balance of the yeast (candida) and bacteria that live in the vagina. This change causes an overgrowth of yeast, which causes the inflammation. What increases the risk? The condition is more likely to develop in women who:  Take antibiotic medicines.  Have diabetes.  Take birth control pills.  Are pregnant.  Douche often.  Have a weak body defense system (immune system).  Have been taking steroid medicines for a long time.  Frequently wear tight clothing. What are the signs or symptoms? Symptoms of this condition include:  White, thick, creamy vaginal discharge.  Swelling, itching, redness, and irritation of the vagina. The lips of the vagina (vulva) may be affected as well.  Pain or a burning feeling while urinating.  Pain during sex. How is this diagnosed? This condition is diagnosed based on:  Your medical history.  A physical exam.  A pelvic exam. Your health care provider will examine a sample of your vaginal discharge under a microscope. Your health care provider may send this sample for testing to confirm the diagnosis. How is this treated? This condition is treated with medicine. Medicines may be over-the-counter or prescription. You may be told to use one or more of the following:  Medicine that is taken by mouth (orally).  Medicine that is applied as a cream (topically).  Medicine that is inserted directly into the vagina (suppository). Follow these instructions at home:  Lifestyle  Do not have sex until your health care provider approves. Tell your sex partner that you have a yeast infection. That person should go to his or her health care  provider and ask if they should also be treated.  Do not wear tight clothes, such as pantyhose or tight pants.  Wear breathable cotton underwear. General instructions  Take or apply over-the-counter and prescription medicines only as told by your health care provider.  Eat more yogurt. This may help to keep your yeast infection from returning.  Do not use tampons until your health care provider approves.  Try taking a sitz bath to help with discomfort. This is a warm water bath that is taken while you are sitting down. The water should only come up to your hips and should cover your buttocks. Do this 3-4 times per day or as told by your health care provider.  Do not douche.  If you have diabetes, keep your blood sugar levels under control.  Keep all follow-up visits as told by your health care provider. This is important. Contact a health care provider if:  You have a fever.  Your symptoms go away and then return.  Your symptoms do not get better with treatment.  Your symptoms get worse.  You have new symptoms.  You develop blisters in or around your vagina.  You have blood coming from your vagina and it is not your menstrual period.  You develop pain in your abdomen. Summary  Vaginal yeast infection is a condition that causes discharge as well as soreness, swelling, and redness (inflammation) of the vagina.  This condition is treated with medicine. Medicines may be over-the-counter or prescription.  Take or apply over-the-counter and prescription medicines only as told by your health care provider.  Do not douche.  Do not have sex or use tampons until your health care provider approves.  Contact a health care provider if your symptoms do not get better with treatment or your symptoms go away and then return. This information is not intended to replace advice given to you by your health care provider. Make sure you discuss any questions you have with your health care  provider. Document Released: 11/17/2004 Document Revised: 06/26/2017 Document Reviewed: 06/26/2017 Elsevier Interactive Patient Education  2019 [employer] Bacterial Vaginosis  Bacterial vaginosis is a vaginal infection that occurs when the normal balance of bacteria in the vagina is disrupted. It results from an overgrowth of certain bacteria. This is the most common vaginal infection among women ages 47-44. Because bacterial vaginosis increases your risk for STIs (sexually transmitted infections), getting treated can help reduce your risk for chlamydia, gonorrhea, herpes, and HIV (human immunodeficiency virus). Treatment is also important for preventing complications in pregnant women, because this condition can cause an early (premature) delivery. What are the causes? This condition is caused by an increase in harmful bacteria that are normally present in small amounts in the vagina. However, the reason that the condition develops is not fully understood. What increases the risk? The following factors may make you more likely to develop this condition:  Having a new sexual partner or multiple sexual partners.  Having unprotected sex.  Douching.  Having an intrauterine device (IUD).  Smoking.  Drug and alcohol abuse.  Taking certain antibiotic medicines.  Being pregnant. You cannot get bacterial vaginosis from toilet seats, bedding, swimming pools, or contact with objects around you. What are the signs or symptoms? Symptoms of this condition include:  Grey or white vaginal discharge. The discharge can also be watery or foamy.  A fish-like odor with discharge, especially after sexual intercourse or during menstruation.  Itching in and around the vagina.  Burning or pain with urination. Some women with bacterial vaginosis have no signs or symptoms. How is this diagnosed? This condition is diagnosed based on:  Your medical history.  A physical exam of the  vagina.  Testing a sample of vaginal fluid under a microscope to look for a large amount of bad bacteria or abnormal cells. Your health care provider may use a cotton swab or a small wooden spatula to collect the sample. How is this treated? This condition is treated with antibiotics. These may be given as a pill, a vaginal cream, or a medicine that is put into the vagina (suppository). If the condition comes back after treatment, a second round of antibiotics may be needed. Follow these instructions at home: Medicines  Take over-the-counter and prescription medicines only as told by your health care provider.  Take or use your antibiotic as told by your health care provider. Do not stop taking or using the antibiotic even if you start to feel better. General instructions  If you have a female sexual partner, tell her that you have a vaginal infection. She should see her health care provider and be treated if she has symptoms. If you have a female sexual partner, he does not need treatment.  During treatment: ? Avoid sexual activity until you finish treatment. ? Do not douche. ? Avoid alcohol as directed by your health care provider. ? Avoid breastfeeding as directed by your health care provider.  Drink enough water and fluids to keep your urine clear or pale yellow.  Keep the area around your vagina and rectum clean. ? Wash the area daily with  warm water. ? Wipe yourself from front to back after using the toilet.  Keep all follow-up visits as told by your health care provider. This is important. How is this prevented?  Do not douche.  Wash the outside of your vagina with warm water only.  Use protection when having sex. This includes latex condoms and dental dams.  Limit how many sexual partners you have. To help prevent bacterial vaginosis, it is best to have sex with just one partner (monogamous).  Make sure you and your sexual partner are tested for STIs.  Wear cotton or  cotton-lined underwear.  Avoid wearing tight pants and pantyhose, especially during summer.  Limit the amount of alcohol that you drink.  Do not use any products that contain nicotine or tobacco, such as cigarettes and e-cigarettes. If you need help quitting, ask your health care provider.  Do not use illegal drugs. Where to find more information  Centers for Disease Control and Prevention: SolutionApps.co.za  American Sexual Health Association (ASHA): www.ashastd.org  [place] Department of Health and Health and safety inspector, Office on Women's Health: ConventionalMedicines.si or http://www.anderson-williamson.info/ Contact a health care provider if:  Your symptoms do not improve, even after treatment.  You have more discharge or pain when urinating.  You have a fever.  You have pain in your abdomen.  You have pain during sex.  You have vaginal bleeding between periods. Summary  Bacterial vaginosis is a vaginal infection that occurs when the normal balance of bacteria in the vagina is disrupted.  Because bacterial vaginosis increases your risk for STIs (sexually transmitted infections), getting treated can help reduce your risk for chlamydia, gonorrhea, herpes, and HIV (human immunodeficiency virus). Treatment is also important for preventing complications in pregnant women, because the condition can cause an early (premature) delivery.  This condition is treated with antibiotic medicines. These may be given as a pill, a vaginal cream, or a medicine that is put into the vagina (suppository). This information is not intended to replace advice given to you by your health care provider. Make sure you discuss any questions you have with your health care provider. Document Released: 02/07/2005 Document Revised: 06/13/2016 Document Reviewed: 10/24/2015 Elsevier Interactive Patient Education  2019 [employer]. Trichomoniasis Trichomoniasis is an STI (sexually transmitted  infection) that can affect both women and men. In women, the outer area of the female genitalia (vulva) and the vagina are affected. In men, the penis is mainly affected, but the prostate and other reproductive organs can also be involved. This condition can be treated with medicine. It often has no symptoms (is asymptomatic), especially in men. What are the causes? This condition is caused by an organism called Trichomonas vaginalis. Trichomoniasis most often spreads from person to person (is contagious) through sexual contact. What increases the risk? The following factors may make you more likely to develop this condition:  Having unprotected sexual intercourse.  Having sexual intercourse with a partner who has trichomoniasis.  Having multiple sexual partners.  Having had previous trichomoniasis infections or other STIs. What are the signs or symptoms? In women, symptoms of trichomoniasis include:  Abnormal vaginal discharge that is clear, white, gray, or yellow-green and foamy and has an unusual "fishy" odor.  Itching and irritation of the vagina and vulva.  Burning or pain during urination or sexual intercourse.  Genital redness and swelling. In men, symptoms of trichomoniasis include:  Penile discharge that may be foamy or contain pus.  Pain in the penis. This may happen only when urinating.  Itching or irritation inside the penis.  Burning after urination or ejaculation. How is this diagnosed? In women, this condition may be found during a routine Pap test or physical exam. It may be found in men during a routine physical exam. Your health care provider may perform tests to help diagnose this infection, such as:  Urine tests (men and women).  The following in women: ? Testing the pH of the vagina. ? A vaginal swab test that checks for the Trichomonas vaginalis organism. ? Testing vaginal secretions. Your health care provider may test you for other STIs, including HIV  (human immunodeficiency virus). How is this treated? This condition is treated with medicine taken by mouth (orally), such as metronidazole or tinidazole to fight the infection. Your sexual partner(s) may also need to be tested and treated.  If you are a woman and you plan to become pregnant or think you may be pregnant, tell your health care provider right away. Some medicines that are used to treat the infection should not be taken during pregnancy. Your health care provider may recommend over-the-counter medicines or creams to help relieve itching or irritation. You may be tested for infection again 3 months after treatment. Follow these instructions at home:  Take and use over-the-counter and prescription medicines, including creams, only as told by your health care provider.  Do not have sexual intercourse until one week after you finish your medicine, or until your health care provider approves. Ask your health care provider when you may resume sexual intercourse.  (Women) Do not douche or wear tampons while you have the infection.  Discuss your infection with your sexual partner(s). Make sure that your partner gets tested and treated, if necessary.  Keep all follow-up visits as told by your health care provider. This is important. How is this prevented?  Use condoms every time you have sex. Using condoms correctly and consistently can help protect against STIs.  Avoid having multiple sexual partners.  Talk with your sexual partner about any symptoms that either of you may have, as well as any history of STIs.  Get tested for STIs and STDs (sexually transmitted diseases) before you have sex. Ask your partner to do the same.  Do not have sexual contact if you have symptoms of trichomoniasis or another STI. Contact a health care provider if:  You still have symptoms after you finish your medicine.  You develop pain in your abdomen.  You have pain when you urinate.  You have  bleeding after sexual intercourse.  You develop a rash.  You feel nauseous or you vomit.  You plan to become pregnant or think you may be pregnant. Summary  Trichomoniasis is an STI (sexually transmitted infection) that can affect both women and men.  This condition often has no symptoms (is asymptomatic), especially in men.  You should not have sexual intercourse until one week after you finish your medicine, or until your health care provider approves. Ask your health care provider when you may resume sexual intercourse.  Discuss your infection with your sexual partner. Make sure that your partner gets tested and treated, if necessary. This information is not intended to replace advice given to you by your health care provider. Make sure you discuss any questions you have with your health care provider. Document Released: 08/03/2000 Document Revised: 01/01/2016 Document Reviewed: 01/01/2016 Elsevier Interactive Patient Education  2019 [employer] Inc.  Hypertension Hypertension, commonly called high blood pressure, is when the force of blood pumping through the  arteries is too strong. The arteries are the blood vessels that carry blood from the heart throughout the body. Hypertension forces the heart to work harder to pump blood and may cause arteries to become narrow or stiff. Having untreated or uncontrolled hypertension can cause heart attacks, strokes, kidney disease, and other problems. A blood pressure reading consists of a higher number over a lower number. Ideally, your blood pressure should be below 120/80. The first ("top") number is called the systolic pressure. It is a measure of the pressure in your arteries as your heart beats. The second ("bottom") number is called the diastolic pressure. It is a measure of the pressure in your arteries as the heart relaxes. What are the causes? The cause of this condition is not known. What increases the risk? Some risk factors for high  blood pressure are under your control. Others are not. Factors you can change  Smoking.  Having type 2 diabetes mellitus, high cholesterol, or both.  Not getting enough exercise or physical activity.  Being overweight.  Having too much fat, sugar, calories, or salt (sodium) in your diet.  Drinking too much alcohol. Factors that are difficult or impossible to change  Having chronic kidney disease.  Having a family history of high blood pressure.  Age. Risk increases with age.  Race. You may be at higher risk if you are African-American.  Gender. Men are at higher risk than women before age 72. After age 80, women are at higher risk than men.  Having obstructive sleep apnea.  Stress. What are the signs or symptoms? Extremely high blood pressure (hypertensive crisis) may cause:  Headache.  Anxiety.  Shortness of breath.  Nosebleed.  Nausea and vomiting.  Severe chest pain.  Jerky movements you cannot control (seizures). How is this diagnosed? This condition is diagnosed by measuring your blood pressure while you are seated, with your arm resting on a surface. The cuff of the blood pressure monitor will be placed directly against the skin of your upper arm at the level of your heart. It should be measured at least twice using the same arm. Certain conditions can cause a difference in blood pressure between your right and left arms. Certain factors can cause blood pressure readings to be lower or higher than normal (elevated) for a short period of time:  When your blood pressure is higher when you are in a health care provider's office than when you are at home, this is called white coat hypertension. Most people with this condition do not need medicines.  When your blood pressure is higher at home than when you are in a health care provider's office, this is called masked hypertension. Most people with this condition may need medicines to control blood pressure. If you  have a high blood pressure reading during one visit or you have normal blood pressure with other risk factors:  You may be asked to return on a different day to have your blood pressure checked again.  You may be asked to monitor your blood pressure at home for 1 week or longer. If you are diagnosed with hypertension, you may have other blood or imaging tests to help your health care provider understand your overall risk for other conditions. How is this treated? This condition is treated by making healthy lifestyle changes, such as eating healthy foods, exercising more, and reducing your alcohol intake. Your health care provider may prescribe medicine if lifestyle changes are not enough to get your blood pressure under  control, and if:  Your systolic blood pressure is above 130.  Your diastolic blood pressure is above 80. Your personal target blood pressure may vary depending on your medical conditions, your age, and other factors. Follow these instructions at home: Eating and drinking   Eat a diet that is high in fiber and potassium, and low in sodium, added sugar, and fat. An example eating plan is called the DASH (Dietary Approaches to Stop Hypertension) diet. To eat this way: ? Eat plenty of fresh fruits and vegetables. Try to fill half of your plate at each meal with fruits and vegetables. ? Eat whole grains, such as whole wheat pasta, brown rice, or whole grain bread. Fill about one quarter of your plate with whole grains. ? Eat or drink low-fat dairy products, such as skim milk or low-fat yogurt. ? Avoid fatty cuts of meat, processed or cured meats, and poultry with skin. Fill about one quarter of your plate with lean proteins, such as fish, chicken without skin, beans, eggs, and tofu. ? Avoid premade and processed foods. These tend to be higher in sodium, added sugar, and fat.  Reduce your daily sodium intake. Most people with hypertension should eat less than 1,500 mg of sodium a  day.  Limit alcohol intake to no more than 1 drink a day for nonpregnant women and 2 drinks a day for men. One drink equals 12 oz of beer, 5 oz of wine, or 1 oz of hard liquor. Lifestyle   Work with your health care provider to maintain a healthy body weight or to lose weight. Ask what an ideal weight is for you.  Get at least 30 minutes of exercise that causes your heart to beat faster (aerobic exercise) most days of the week. Activities may include walking, swimming, or biking.  Include exercise to strengthen your muscles (resistance exercise), such as pilates or lifting weights, as part of your weekly exercise routine. Try to do these types of exercises for 30 minutes at least 3 days a week.  Do not use any products that contain nicotine or tobacco, such as cigarettes and e-cigarettes. If you need help quitting, ask your health care provider.  Monitor your blood pressure at home as told by your health care provider.  Keep all follow-up visits as told by your health care provider. This is important. Medicines  Take over-the-counter and prescription medicines only as told by your health care provider. Follow directions carefully. Blood pressure medicines must be taken as prescribed.  Do not skip doses of blood pressure medicine. Doing this puts you at risk for problems and can make the medicine less effective.  Ask your health care provider about side effects or reactions to medicines that you should watch for. Contact a health care provider if:  You think you are having a reaction to a medicine you are taking.  You have headaches that keep coming back (recurring).  You feel dizzy.  You have swelling in your ankles.  You have trouble with your vision. Get help right away if:  You develop a severe headache or confusion.  You have unusual weakness or numbness.  You feel faint.  You have severe pain in your chest or abdomen.  You vomit repeatedly.  You have trouble  breathing. Summary  Hypertension is when the force of blood pumping through your arteries is too strong. If this condition is not controlled, it may put you at risk for serious complications.  Your personal target blood pressure may  vary depending on your medical conditions, your age, and other factors. For most people, a normal blood pressure is less than 120/80.  Hypertension is treated with lifestyle changes, medicines, or a combination of both. Lifestyle changes include weight loss, eating a healthy, low-sodium diet, exercising more, and limiting alcohol. This information is not intended to replace advice given to you by your health care provider. Make sure you discuss any questions you have with your health care provider. Document Released: 02/07/2005 Document Revised: 01/06/2016 Document Reviewed: 01/06/2016 Elsevier Interactive Patient Education  2019 Else[employer] Inc. Amlodipine tablets What is this medicine? AMLODIPINE (am LOE di peen) is a calcium-channel blocker. It affects the amount of calcium found in your heart and muscle cells. This relaxes your blood vessels, which can reduce the amount of work the heart has to do. This medicine is used to lower high blood pressure. It is also used to prevent chest pain. This medicine may be used for other purposes; ask your health care provider or pharmacist if you have questions. COMMON BRAND NAME(S): Norvasc What should I tell my health care provider before I take this medicine? They need to know if you have any of these conditions: -heart disease -liver disease -an unusual or allergic reaction to amlodipine, other medicines, foods, dyes, or preservatives -pregnant or trying to get pregnant -breast-feeding How should I use this medicine? Take this medicine by mouth with a glass of water. Follow the directions on the prescription label. You can take it with or without food. If it upsets your stomach, take it with food. Take your medicine at  regular intervals. Do not take it more often than directed. Do not stop taking except on your doctor's advice. Talk to your pediatrician regarding the use of this medicine in children. While this drug may be prescribed for children as young as 6 years for selected conditions, precautions do apply. Patients over 15 years of age may have a stronger reaction and need a smaller dose. Overdosage: If you think you have taken too much of this medicine contact a poison control center or emergency room at once. NOTE: This medicine is only for you. Do not share this medicine with others. What if I miss a dose? If you miss a dose, take it as soon as you can. If it is almost time for your next dose, take only that dose. Do not take double or extra doses. What may interact with this medicine? Do not take this medicine with any of the following medications: -tranylcypromine This medicine may also interact with the following medications: -clarithromycin -cyclosporine -diltiazem -itraconazole -simvastatin -tacrolimus This list may not describe all possible interactions. Give your health care provider a list of all the medicines, herbs, non-prescription drugs, or dietary supplements you use. Also tell them if you smoke, drink alcohol, or use illegal drugs. Some items may interact with your medicine. What should I watch for while using this medicine? Visit your healthcare professional for regular checks on your progress. Check your blood pressure as directed. Ask your healthcare professional what your blood pressure should be and when you should contact him or her. Do not treat yourself for coughs, colds, or pain while you are using this medicine without asking your healthcare professional for advice. Some medicines may increase your blood pressure. You may get dizzy. Do not drive, use machinery, or do anything that needs mental alertness until you know how this medicine affects you. Do not stand or sit up  quickly, especially if you  are an older patient. This reduces the risk of dizzy or fainting spells. Avoid alcoholic drinks; they can make you dizzier. What side effects may I notice from receiving this medicine? Side effects that you should report to your doctor or health care professional as soon as possible: -allergic reactions like skin rash, itching or hives; swelling of the face, lips, or tongue -fast, irregular heartbeat -signs and symptoms of low blood pressure like dizziness; feeling faint or lightheaded, falls; unusually weak or tired -swelling of ankles, feet, hands Side effects that usually do not require medical attention (report these to your doctor or health care professional if they continue or are bothersome): -dry mouth -facial flushing -headache -stomach pain -tiredness This list may not describe all possible side effects. Call your doctor for medical advice about side effects. You may report side effects to FDA at 1-800-FDA-1088. Where should I keep my medicine? Keep out of the reach of children. Store at room temperature between 59 and 86 degrees F (15 and 30 degrees C). Throw away any unused medicine after the expiration date. NOTE: This sheet is a summary. It may not cover all possible information. If you have questions about this medicine, talk to your doctor, pharmacist, or health care provider.  2019 Elsevier/Gold Standard (2017-09-01 15:07:10)

## 2018-06-27 ENCOUNTER — Encounter: Payer: Self-pay | Admitting: Family Medicine

## 2018-06-27 DIAGNOSIS — R7989 Other specified abnormal findings of blood chemistry: Secondary | ICD-10-CM | POA: Insufficient documentation

## 2018-06-27 DIAGNOSIS — F17200 Nicotine dependence, unspecified, uncomplicated: Secondary | ICD-10-CM | POA: Insufficient documentation

## 2018-06-27 DIAGNOSIS — M25531 Pain in right wrist: Secondary | ICD-10-CM | POA: Insufficient documentation

## 2018-06-27 DIAGNOSIS — M25431 Effusion, right wrist: Secondary | ICD-10-CM | POA: Insufficient documentation

## 2018-06-27 DIAGNOSIS — N898 Other specified noninflammatory disorders of vagina: Secondary | ICD-10-CM | POA: Insufficient documentation

## 2018-06-27 DIAGNOSIS — G5601 Carpal tunnel syndrome, right upper limb: Secondary | ICD-10-CM | POA: Insufficient documentation

## 2018-06-27 LAB — THYROID PANEL WITH TSH
Free Thyroxine Index: 2.4 (ref 1.2–4.9)
T3 Uptake Ratio: 21 % — ABNORMAL LOW (ref 24–39)
T4, Total: 11.3 ug/dL (ref 4.5–12.0)
TSH: 0.53 u[IU]/mL (ref 0.450–4.500)

## 2018-06-29 ENCOUNTER — Other Ambulatory Visit: Payer: Self-pay

## 2018-06-29 LAB — URINE CULTURE

## 2018-07-05 ENCOUNTER — Other Ambulatory Visit: Payer: Self-pay | Admitting: Family Medicine

## 2018-07-05 DIAGNOSIS — B9689 Other specified bacterial agents as the cause of diseases classified elsewhere: Secondary | ICD-10-CM

## 2018-07-05 DIAGNOSIS — N760 Acute vaginitis: Secondary | ICD-10-CM

## 2018-07-05 DIAGNOSIS — A599 Trichomoniasis, unspecified: Secondary | ICD-10-CM

## 2018-07-05 LAB — NUSWAB VAGINITIS PLUS (VG+)
Atopobium vaginae: HIGH Score — AB
BVAB 2: HIGH Score — AB
Candida albicans, NAA: POSITIVE — AB
Candida glabrata, NAA: NEGATIVE
Chlamydia trachomatis, NAA: NEGATIVE
Megasphaera 1: HIGH Score — AB
Neisseria gonorrhoeae, NAA: NEGATIVE
Trich vag by NAA: POSITIVE — AB

## 2018-07-05 MED ORDER — METRONIDAZOLE 500 MG PO TABS: 500.0000 mg | ORAL_TABLET | Freq: Two times a day (BID) | ORAL | 0 refills | Status: DC

## 2018-07-05 MED ORDER — TINIDAZOLE 500 MG PO TABS: 2.0000 g | ORAL_TABLET | Freq: Every day | ORAL | 0 refills | Status: DC

## 2018-07-09 ENCOUNTER — Other Ambulatory Visit: Payer: Self-pay | Admitting: Family Medicine

## 2018-07-09 ENCOUNTER — Telehealth: Payer: Self-pay

## 2018-07-09 DIAGNOSIS — B9689 Other specified bacterial agents as the cause of diseases classified elsewhere: Secondary | ICD-10-CM

## 2018-07-09 DIAGNOSIS — N760 Acute vaginitis: Secondary | ICD-10-CM

## 2018-07-09 DIAGNOSIS — A599 Trichomoniasis, unspecified: Secondary | ICD-10-CM

## 2018-07-09 MED ORDER — METRONIDAZOLE 500 MG PO TABS: 500.0000 mg | ORAL_TABLET | Freq: Two times a day (BID) | ORAL | 0 refills | Status: AC

## 2018-07-09 NOTE — Telephone Encounter (Signed)
-----   Message from Kallie Locks, FNP sent at 07/05/2018  3:32 PM EDT ----- Positive for Trich and BV. Rxs sent to pharmacy today. Please inform patient that it is important for her partner to also receive treatment.

## 2018-07-09 NOTE — Telephone Encounter (Signed)
Patient wants to get a shot to treat the Trich as she is unable to tolerate pills. Please advise.

## 2018-07-11 NOTE — Telephone Encounter (Signed)
Sent to provider 

## 2018-07-31 ENCOUNTER — Ambulatory Visit: Payer: Self-pay | Admitting: Family Medicine

## 2018-08-07 ENCOUNTER — Ambulatory Visit: Payer: Self-pay | Admitting: Family Medicine

## 2019-04-02 ENCOUNTER — Ambulatory Visit (INDEPENDENT_AMBULATORY_CARE_PROVIDER_SITE_OTHER): Payer: 59 | Admitting: Family Medicine

## 2019-04-02 ENCOUNTER — Other Ambulatory Visit: Payer: Self-pay

## 2019-04-02 ENCOUNTER — Encounter: Payer: Self-pay | Admitting: Family Medicine

## 2019-04-02 VITALS — BP 158/96 | HR 112 | Ht 60.0 in | Wt 137.0 lb

## 2019-04-02 DIAGNOSIS — F17200 Nicotine dependence, unspecified, uncomplicated: Secondary | ICD-10-CM | POA: Diagnosis not present

## 2019-04-02 DIAGNOSIS — R7989 Other specified abnormal findings of blood chemistry: Secondary | ICD-10-CM

## 2019-04-02 DIAGNOSIS — Z803 Family history of malignant neoplasm of breast: Secondary | ICD-10-CM | POA: Diagnosis not present

## 2019-04-02 DIAGNOSIS — I159 Secondary hypertension, unspecified: Secondary | ICD-10-CM | POA: Diagnosis not present

## 2019-04-02 DIAGNOSIS — I10 Essential (primary) hypertension: Secondary | ICD-10-CM

## 2019-04-02 DIAGNOSIS — R232 Flushing: Secondary | ICD-10-CM

## 2019-04-02 DIAGNOSIS — N6310 Unspecified lump in the right breast, unspecified quadrant: Secondary | ICD-10-CM

## 2019-04-02 DIAGNOSIS — R030 Elevated blood-pressure reading, without diagnosis of hypertension: Secondary | ICD-10-CM

## 2019-04-02 MED ORDER — AMLODIPINE BESYLATE 5 MG PO TABS: 5.0000 mg | ORAL_TABLET | Freq: Every day | ORAL | 3 refills | Status: DC

## 2019-04-02 MED ORDER — LISINOPRIL-HYDROCHLOROTHIAZIDE 20-25 MG PO TABS: 1.0000 | ORAL_TABLET | Freq: Every day | ORAL | 3 refills | Status: DC

## 2019-04-02 NOTE — Assessment & Plan Note (Signed)
Repeat TSH testing, might need adjustment.

## 2019-04-02 NOTE — Progress Notes (Signed)
   Subjective:    Patient ID: Rachel Fischer is a 49 y.o. female presenting with Breast Problem  on 04/02/2019  HPI: Notes 3 months worth of hot flashes. Poor sleeping. Gets dizzy with her hot flashes. Lump in right side x 1.5 months. Notes that it has gotten bigger since that time. Mom with breast cancer. Does not hurt.  Review of Systems  Constitutional: Negative for chills and fever.  Respiratory: Negative for shortness of breath.   Cardiovascular: Negative for chest pain.  Gastrointestinal: Negative for abdominal pain, nausea and vomiting.  Genitourinary: Negative for dysuria.  Skin: Negative for rash.      Objective:    BP (!) 158/96   Pulse (!) 112   Ht 5' (1.524 m)   Wt 137 lb (62.1 kg)   BMI 26.76 kg/m  Physical Exam Constitutional:      General: She is not in acute distress.    Appearance: She is well-developed.  HENT:     Head: Normocephalic and atraumatic.  Eyes:     General: No scleral icterus. Cardiovascular:     Rate and Rhythm: Normal rate.  Pulmonary:     Effort: Pulmonary effort is normal.  Chest:     Breasts:        Right: No inverted nipple, mass or skin change.        Left: No inverted nipple, mass or skin change.    Abdominal:     Palpations: Abdomen is soft.  Musculoskeletal:     Cervical back: Neck supple.  Skin:    General: Skin is warm and dry.  Neurological:     Mental Status: She is alert and oriented to person, place, and time.         Assessment & Plan:   Problem List Items Addressed This Visit      Unprioritized   Secondary hypertension, unspecified - Primary    Refilled her meds, as her BP is high today      Relevant Medications   amLODipine (NORVASC) 5 MG tablet   lisinopril-hydrochlorothiazide (ZESTORETIC) 20-25 MG tablet   Family history of breast cancer   Smoker    Switched to vape--interested in quitting.      Decreased thyroid stimulating hormone (TSH) level    Repeat TSH testing, might need adjustment.         Other Visit Diagnoses    Elevated blood pressure reading       Hypertension, unspecified type       Relevant Medications   amLODipine (NORVASC) 5 MG tablet   lisinopril-hydrochlorothiazide (ZESTORETIC) 20-25 MG tablet   Breast mass, right       Check mammogram and u/s   Relevant Orders   MM DIAG BREAST TOMO BILATERAL   US BREAST LTD UNI RIGHT INC AXILLA   Hot flashes       if menopausal, consider clonidine or effexor, not a good HRT candidate due to smoking status and breast mass.   Relevant Medications   amLODipine (NORVASC) 5 MG tablet   lisinopril-hydrochlorothiazide (ZESTORETIC) 20-25 MG tablet   Other Relevant Orders   Follicle stimulating hormone   TSH     Return in about 3 weeks (around 04/23/2019).  Total face-to-face time with patient: 25 minutes. Over 50% of encounter was spent on counseling and coordination of care.  Reva Bores 04/02/2019 5:07 PM

## 2019-04-02 NOTE — Progress Notes (Signed)
Lump in right breast x a month and a half and has grown in that time.   Hot flashes at night and having trouble sleeping  Last mammo was in 2018

## 2019-04-02 NOTE — Assessment & Plan Note (Signed)
Switched to vape--interested in quitting.

## 2019-04-02 NOTE — Assessment & Plan Note (Signed)
Refilled her meds, as her BP is high today

## 2019-04-04 LAB — TSH: TSH: 0.51 u[IU]/mL (ref 0.450–4.500)

## 2019-04-04 LAB — FOLLICLE STIMULATING HORMONE: FSH: 129 m[IU]/mL

## 2019-04-16 ENCOUNTER — Other Ambulatory Visit: Payer: Self-pay

## 2019-04-16 ENCOUNTER — Ambulatory Visit
Admission: RE | Admit: 2019-04-16 | Discharge: 2019-04-16 | Disposition: A | Discharge status: Home / Self Care | Payer: 59 | Source: Ambulatory Visit | Attending: Family Medicine | Admitting: Family Medicine

## 2019-04-16 DIAGNOSIS — N6310 Unspecified lump in the right breast, unspecified quadrant: Secondary | ICD-10-CM

## 2019-08-12 ENCOUNTER — Emergency Department (HOSPITAL_COMMUNITY)
Admission: EM | Admit: 2019-08-12 | Discharge: 2019-08-12 | Disposition: A | Discharge status: Home / Self Care | Payer: 59 | Attending: Emergency Medicine | Admitting: Emergency Medicine

## 2019-08-12 ENCOUNTER — Other Ambulatory Visit: Payer: Self-pay

## 2019-08-12 ENCOUNTER — Encounter (HOSPITAL_COMMUNITY): Payer: Self-pay | Admitting: Emergency Medicine

## 2019-08-12 ENCOUNTER — Ambulatory Visit (HOSPITAL_COMMUNITY)
Admission: EM | Admit: 2019-08-12 | Discharge: 2019-08-12 | Disposition: A | Discharge status: Other Acute Inpatient Hospital | Payer: 59 | Source: Home / Self Care

## 2019-08-12 DIAGNOSIS — I10 Essential (primary) hypertension: Secondary | ICD-10-CM | POA: Diagnosis present

## 2019-08-12 DIAGNOSIS — Z5321 Procedure and treatment not carried out due to patient leaving prior to being seen by health care provider: Secondary | ICD-10-CM | POA: Insufficient documentation

## 2019-08-12 DIAGNOSIS — R000 Tachycardia, unspecified: Secondary | ICD-10-CM

## 2019-08-12 DIAGNOSIS — R0789 Other chest pain: Secondary | ICD-10-CM | POA: Diagnosis not present

## 2019-08-12 DIAGNOSIS — R0682 Tachypnea, not elsewhere classified: Secondary | ICD-10-CM | POA: Diagnosis not present

## 2019-08-12 NOTE — ED Triage Notes (Signed)
Pt. Stated, when I woke up this morning I felt this sharpe chest pain come across my chest and its off and on.

## 2019-08-12 NOTE — ED Notes (Signed)
Patient is being discharged from the Urgent [facility] and sent to the Emergency Department via stretcher by EMS. Per provider Moshe Cipro, patient is stable but in need of higher level of care due to chest pain & cardiac history. Patient is aware and verbalizes understanding of plan of care.   Vitals:   08/12/19 1140  BP: (!) 203/122  Pulse: (!) 105  Resp: (!) 22  Temp: 98.2 F (36.8 C)  SpO2: 100%

## 2019-08-12 NOTE — ED Triage Notes (Signed)
Pt has not had my BP medicine in 2 months.

## 2019-08-12 NOTE — ED Triage Notes (Signed)
Pt presents with left chest pain with intermittent shortness of breath since waking up this morning with elevated blood pressure.  Pt has Hx of stroke and has not taken blood pressure medication in 2 months.

## 2019-08-12 NOTE — ED Notes (Signed)
Pt notified staff that she is leaving due to not wanting to wait, IV taken out

## 2019-08-15 NOTE — ED Provider Notes (Signed)
Reports that she has hypertension and has been out of her medication for about 2 months. Reports chest pain and severe headache that is increasing in severity since 7am this morning. Also has history of tobacco use, lung nodule.  PE: On exam, she is writhing on the table and grabbing her left chest.  General: In acute distress Cardiac: tachycardic, regular rhythm Pulmonary: CTAB, tachypnea, unlabored Neuro: PERRLA, equal grip strength 5/5, facial expressions symmetrical  Assessment/Plan:  HTN Tachycardia Tachypnea Chest Pain  BP 203/122 in office today EKG shows sinus tach without T wave abnormality. Cannot rule out pulmonary embolism or other cardiac causes of chest pain in the office   Discussed with patient the need for further evaluation and treatment in the ER. EMS activated and patient transported to ER via EMS.     Moshe Cipro, NP 08/15/19 1020

## 2019-08-15 NOTE — Discharge Instructions (Addendum)
Go to the ER for further evaluation and treatment. 

## 2020-09-07 ENCOUNTER — Other Ambulatory Visit: Payer: Self-pay

## 2020-09-07 ENCOUNTER — Emergency Department (HOSPITAL_BASED_OUTPATIENT_CLINIC_OR_DEPARTMENT_OTHER)
Admission: EM | Admit: 2020-09-07 | Discharge: 2020-09-07 | Disposition: A | Discharge status: Home / Self Care | Payer: 59 | Attending: Emergency Medicine | Admitting: Emergency Medicine

## 2020-09-07 ENCOUNTER — Encounter (HOSPITAL_BASED_OUTPATIENT_CLINIC_OR_DEPARTMENT_OTHER): Payer: Self-pay | Admitting: *Deleted

## 2020-09-07 DIAGNOSIS — Z79899 Other long term (current) drug therapy: Secondary | ICD-10-CM | POA: Insufficient documentation

## 2020-09-07 DIAGNOSIS — Z760 Encounter for issue of repeat prescription: Secondary | ICD-10-CM | POA: Insufficient documentation

## 2020-09-07 DIAGNOSIS — F17210 Nicotine dependence, cigarettes, uncomplicated: Secondary | ICD-10-CM | POA: Insufficient documentation

## 2020-09-07 DIAGNOSIS — G5601 Carpal tunnel syndrome, right upper limb: Secondary | ICD-10-CM | POA: Insufficient documentation

## 2020-09-07 DIAGNOSIS — M5430 Sciatica, unspecified side: Secondary | ICD-10-CM | POA: Insufficient documentation

## 2020-09-07 DIAGNOSIS — I10 Essential (primary) hypertension: Secondary | ICD-10-CM | POA: Insufficient documentation

## 2020-09-07 DIAGNOSIS — M549 Dorsalgia, unspecified: Secondary | ICD-10-CM | POA: Insufficient documentation

## 2020-09-07 LAB — URINALYSIS, ROUTINE W REFLEX MICROSCOPIC
Bilirubin Urine: NEGATIVE
Glucose, UA: NEGATIVE mg/dL
Ketones, ur: NEGATIVE mg/dL
Leukocytes,Ua: NEGATIVE
Nitrite: NEGATIVE
Protein, ur: NEGATIVE mg/dL
Specific Gravity, Urine: 1.005 (ref 1.005–1.030)
pH: 7 (ref 5.0–8.0)

## 2020-09-07 LAB — PREGNANCY, URINE: Preg Test, Ur: NEGATIVE

## 2020-09-07 MED ORDER — KETOROLAC TROMETHAMINE 30 MG/ML IJ SOLN
30.0000 mg | Freq: Once | INTRAMUSCULAR | Status: AC
  Administered 2020-09-07: 30 mg via INTRAMUSCULAR
  Filled 2020-09-07: qty 1

## 2020-09-07 MED ORDER — PREDNISONE 10 MG (21) PO TBPK
ORAL_TABLET | Freq: Every day | ORAL | 0 refills | Status: DC
Start: 2020-09-07 — End: 2021-01-04

## 2020-09-07 MED ORDER — AMLODIPINE BESYLATE 5 MG PO TABS: 5.0000 mg | ORAL_TABLET | Freq: Every day | ORAL | 0 refills | Status: DC

## 2020-09-07 MED ORDER — DEXAMETHASONE SODIUM PHOSPHATE 10 MG/ML IJ SOLN
10.0000 mg | Freq: Once | INTRAMUSCULAR | Status: AC
  Administered 2020-09-07: 10 mg via INTRAMUSCULAR
  Filled 2020-09-07: qty 1

## 2020-09-07 MED ORDER — IBUPROFEN 600 MG PO TABS
600.0000 mg | ORAL_TABLET | Freq: Four times a day (QID) | ORAL | 0 refills | Status: DC | PRN
Start: 2020-09-07 — End: 2021-03-01

## 2020-09-07 MED ORDER — HYDROCODONE-ACETAMINOPHEN 5-325 MG PO TABS: 1.0000 | ORAL_TABLET | ORAL | 0 refills | Status: DC | PRN

## 2020-09-07 NOTE — ED Triage Notes (Addendum)
Low back pain across lower back started yesterday. Movement or sitting increases pain. Pain in rt hand and arm she questions carpal tunnel. Pain goes up arm, sharp in description. Pt hypertensive in triage, out of her bp meds.

## 2020-09-07 NOTE — ED Notes (Signed)
ED Provider at bedside. 

## 2020-09-07 NOTE — ED Provider Notes (Addendum)
[facility] EMERGENCY DEPT Provider Note   CSN: [id number] Arrival date & time: 09/07/20  1117     History Chief Complaint  Patient presents with   Back Pain   Hand Pain    Rachel Fischer is a 50 y.o. female.  Pt presents to the ED today with back and hand pain.  Pt has had right hand on and off for 20 years.  She has a hx of carpal tunnel syndrome and there was a plan for surgery, but she lost her insurance, so the surgery was canceled.  She has insurance now and is interested in getting surgery.  Her back started hurting yesterday.  She said the pain goes down her legs.  The pt is also out of her bp meds.  She has been out for several months.      Past Medical History:  Diagnosis Date   Anemia    Colon polyps    Decreased thyroid stimulating hormone (TSH) level    Depression    has not been diagnosed   Hx of trichomoniasis    Hyperlipidemia    Hypertension    not currently taking meds due to financial reasons   Lung nodule    Migraine headache    migraines; gets them 4/month   Tobacco use    Vitamin D deficiency     Patient Active Problem List   Diagnosis Date Noted   Carpal tunnel syndrome of right wrist 06/27/2018   Pain and swelling of right wrist 06/27/2018   Smoker 06/27/2018   Decreased thyroid stimulating hormone (TSH) level 06/27/2018   Ovarian cyst 03/17/2016   Family history of breast cancer 03/17/2016   Blurred vision    Cephalalgia    Vision changes    Secondary hypertension, unspecified    Headache 08/06/2015   Pulmonary nodule 06/07/2011    Past Surgical History:  Procedure Laterality Date   ABDOMINAL HYSTERECTOMY     ARM NEUROPLASTY Left    c section x 4     CESAREAN SECTION     FOOT SURGERY       OB History     Gravida  4   Para  4   Term  4   Preterm      AB      Living  4      SAB      IAB      Ectopic      Multiple      Live Births  4           Family History  Problem Relation Age of  Onset   Breast cancer Mother 60   Hypertension Mother    Hyperlipidemia Mother    CAD Mother     Social History   Tobacco Use   Smoking status: Every Day    Packs/day: 0.50    Years: 19.00    Pack years: 9.50    Types: Cigarettes   Smokeless tobacco: Never  Substance Use Topics   Alcohol use: No   Drug use: Yes    Types: Marijuana    Home Medications Prior to Admission medications   Medication Sig Start Date End Date Taking? Authorizing Provider  HYDROcodone-acetaminophen (NORCO/VICODIN) 5-325 MG tablet Take 1 tablet by mouth every 4 (four) hours as needed. 09/07/20  Yes Jacalyn Lefevre, MD  ibuprofen (ADVIL) 600 MG tablet Take 1 tablet (600 mg total) by mouth every 6 (six) hours as needed. 09/07/20  Yes Jacalyn Lefevre, MD  predniSONE (STERAPRED UNI-PAK 21 TAB) 10 MG (21) TBPK tablet Take by mouth daily. Take 6 tabs by mouth daily  for 2 days, then 5 tabs for 2 days, then 4 tabs for 2 days, then 3 tabs for 2 days, 2 tabs for 2 days, then 1 tab by mouth daily for 2 days 09/07/20  Yes Jacalyn Lefevre, MD  amLODipine (NORVASC) 5 MG tablet Take 1 tablet (5 mg total) by mouth daily. 09/07/20   Jacalyn Lefevre, MD  cetirizine (ZYRTEC) 10 MG tablet Take 1 tablet (10 mg total) by mouth daily. Patient not taking: Reported on 04/02/2019 05/18/18   Kallie Locks, FNP  fluticasone [facility]) 50 MCG/ACT nasal spray Place 2 sprays into both nostrils daily. Patient not taking: Reported on 06/26/2018 05/18/18   Kallie Locks, FNP  Ibuprofen-Famotidine 800-26.6 MG TABS Take 800 mg by mouth every 8 (eight) hours as needed. Patient not taking: Reported on 04/02/2019 06/26/18   Kallie Locks, FNP  lisinopril-hydrochlorothiazide (ZESTORETIC) 20-25 MG tablet Take 1 tablet by mouth daily. 04/02/19   Reva Bores, MD  tinidazole (TINDAMAX) 500 MG tablet Take 4 tablets (2,000 mg total) by mouth daily with breakfast. Patient not taking: Reported on 04/02/2019 07/05/18   Kallie Locks, FNP  Vitamin D,  Ergocalciferol, (DRISDOL) 1.25 MG (50000 UT) CAPS capsule Take 1 capsule (50,000 Units total) by mouth every 7 (seven) days. Patient not taking: Reported on 04/02/2019 06/26/18   Kallie Locks, FNP    Allergies    Ciprofloxacin, Morphine and related, and Penicillins  Review of Systems   Review of Systems  Musculoskeletal:  Positive for back pain.       Right hand pain  All other systems reviewed and are negative.  Physical Exam Updated Vital Signs BP (!) 204/108 (BP Location: Right Arm)   Pulse 86   Temp 98.1 F (36.7 C) (Oral)   Resp 14   Ht 5' (1.524 m)   Wt 61.2 kg   SpO2 100%   BMI 26.37 kg/m   Physical Exam Vitals and nursing note reviewed.  Constitutional:      Appearance: Normal appearance.  HENT:     Head: Normocephalic and atraumatic.     Right Ear: External ear normal.     Left Ear: External ear normal.     Nose: Nose normal.     Mouth/Throat:     Mouth: Mucous membranes are moist.     Pharynx: Oropharynx is clear.  Eyes:     Extraocular Movements: Extraocular movements intact.     Conjunctiva/sclera: Conjunctivae normal.     Pupils: Pupils are equal, round, and reactive to light.  Cardiovascular:     Rate and Rhythm: Normal rate and regular rhythm.     Pulses: Normal pulses.     Heart sounds: Normal heart sounds.  Pulmonary:     Effort: Pulmonary effort is normal.     Breath sounds: Normal breath sounds.  Abdominal:     General: Abdomen is flat. Bowel sounds are normal.     Palpations: Abdomen is soft.  Musculoskeletal:     Cervical back: Normal range of motion and neck supple.     Comments: Low back tenderness right and left  Skin:    General: Skin is warm.     Capillary Refill: Capillary refill takes less than 2 seconds.  Neurological:     General: No focal deficit present.     Mental Status: She is alert and oriented to person, place,  and time.  Psychiatric:        Mood and Affect: Mood normal.        Behavior: Behavior normal.    ED  Results / Procedures / Treatments   Labs (all labs ordered are listed, but only abnormal results are displayed) Labs Reviewed  URINALYSIS, ROUTINE W REFLEX MICROSCOPIC - Abnormal; Notable for the following components:      Result Value   Color, Urine COLORLESS (*)    Hgb urine dipstick TRACE (*)    Bacteria, UA RARE (*)    All other components within normal limits  PREGNANCY, URINE    EKG None  Radiology No results found.  Procedures Procedures   Medications Ordered in ED Medications  ketorolac (TORADOL) 30 MG/ML injection 30 mg (30 mg Intramuscular Given 09/07/20 1207)  dexamethasone (DECADRON) injection 10 mg (10 mg Intramuscular Given 09/07/20 1204)    ED Course  I have reviewed the triage vital signs and the nursing notes.  Pertinent labs & imaging results that were available during my care of the patient were reviewed by me and considered in my medical decision making (see chart for details).    MDM Rules/Calculators/A&P                         Due to prior diagnosis of CTS, she is given a wrist brace and instructed to f/u with hand.  Pt started back on amlodipine.  She is d/c with prednisone, ibuprofen, and lortab.  She is to return if worse.  She is to establish with a pcp.  Return if worse. Final Clinical Impression(s) / ED Diagnoses Final diagnoses:  Hypertension, unspecified type  Acute sciatica  Carpal tunnel syndrome of right wrist  Medication refill    Rx / DC Orders ED Discharge Orders          Ordered    amLODipine (NORVASC) 5 MG tablet  Daily        09/07/20 1206    predniSONE (STERAPRED UNI-PAK 21 TAB) 10 MG (21) TBPK tablet  Daily        09/07/20 1208    ibuprofen (ADVIL) 600 MG tablet  Every 6 hours PRN        09/07/20 1208    HYDROcodone-acetaminophen (NORCO/VICODIN) 5-325 MG tablet  Every 4 hours PRN        09/07/20 1208             Jacalyn Lefevre, MD 09/07/20 1512    Jacalyn Lefevre, MD 09/07/20 1512

## 2020-11-12 ENCOUNTER — Encounter: Payer: Self-pay | Admitting: Neurology

## 2020-12-30 ENCOUNTER — Encounter: Payer: Self-pay | Admitting: Neurology

## 2020-12-30 ENCOUNTER — Other Ambulatory Visit: Payer: Self-pay

## 2020-12-30 ENCOUNTER — Ambulatory Visit: Payer: 59 | Admitting: Neurology

## 2020-12-30 VITALS — BP 165/98 | HR 108 | Ht 60.0 in | Wt 144.0 lb

## 2020-12-30 DIAGNOSIS — M25531 Pain in right wrist: Secondary | ICD-10-CM | POA: Diagnosis not present

## 2020-12-30 DIAGNOSIS — R202 Paresthesia of skin: Secondary | ICD-10-CM | POA: Diagnosis not present

## 2020-12-30 NOTE — Progress Notes (Signed)
[facility] Clinic Note - Initial Visit   Date: 12/30/20  Rachel SCURLOCK MRN: [id number] DOB: 06-16-1970   Dear Rachel Riffle, PA:  Thank you for your kind referral of Rachel Fischer for consultation of right hand weakness. Although her history is well known to you, please allow Rachel Fischer to reiterate it for the purpose of our medical record. The patient was accompanied to the clinic by self.   History of Present Illness: Rachel Fischer is a 50 y.o. right-handed female with hypertension and tobacco use presenting for evaluation of right hand pain.   Starting around July 2022, she began having achy pain and burning pain over the wrist and dorsum of the hand. It is worse with wrist rotation, writing, and fine finger movements. She has difficulty extending her fingers due to severity of pain. Pain is better when she is resting.  She has some tingling in the last three fingers.  No numbness/tingling over the palm of the hand. She also complains of hand weakness.  She takes gabapentin 600mg  twice daily and tizanidine 2mg , but appreciated no benefit. She has history of left ulnar nerve decompression and states her current symptoms are very different from what she experienced in the past. She has been told she has carpal tunnel syndrome in the right hand.  She works as a at .   Out-side paper records, electronic medical record, and images have been reviewed where available and summarized as:  Lab Results  Component Value Date   HGBA1C 5.4 05/18/2018   Lab Results  Component Value Date   VITAMINB12 497 05/18/2018   Lab Results  Component Value Date   TSH 0.510 04/02/2019    Past Medical History:  Diagnosis Date   Anemia    Colon polyps    Decreased thyroid stimulating hormone (TSH) level    Depression    has not been diagnosed   Hx of trichomoniasis    Hyperlipidemia    Hypertension    not currently taking meds due to financial reasons    Lung nodule    Migraine headache    migraines; gets them 4/month   Tobacco use    Vitamin D deficiency     Past Surgical History:  Procedure Laterality Date   ABDOMINAL HYSTERECTOMY     ARM NEUROPLASTY Left    c section x 4     CESAREAN SECTION     FOOT SURGERY       Medications:  Outpatient Encounter Medications as of 12/30/2020  Medication Sig   amLODipine (NORVASC) 5 MG tablet Take 1 tablet (5 mg total) by mouth daily.   gabapentin (NEURONTIN) 600 MG tablet Take 600 mg by mouth 2 (two) times daily.   tiZANidine (ZANAFLEX) 2 MG tablet Take 2 mg by mouth at bedtime as needed.   cetirizine (ZYRTEC) 10 MG tablet Take 1 tablet (10 mg total) by mouth daily. (Patient not taking: Reported on 04/02/2019)   fluticasone (FLONASE) 50 MCG/ACT nasal spray Place 2 sprays into both nostrils daily. (Patient not taking: Reported on 06/26/2018)   HYDROcodone-acetaminophen (NORCO/VICODIN) 5-325 MG tablet Take 1 tablet by mouth every 4 (four) hours as needed. (Patient not taking: Reported on 12/30/2020)   ibuprofen (ADVIL) 600 MG tablet Take 1 tablet (600 mg total) by mouth every 6 (six) hours as needed. (Patient not taking: Reported on 12/30/2020)   Ibuprofen-Famotidine 800-26.6 MG TABS Take 800 mg by mouth every 8 (eight) hours as needed. (Patient not taking:  Reported on 04/02/2019)   lisinopril-hydrochlorothiazide (ZESTORETIC) 20-25 MG tablet Take 1 tablet by mouth daily.   predniSONE (STERAPRED UNI-PAK 21 TAB) 10 MG (21) TBPK tablet Take by mouth daily. Take 6 tabs by mouth daily  for 2 days, then 5 tabs for 2 days, then 4 tabs for 2 days, then 3 tabs for 2 days, 2 tabs for 2 days, then 1 tab by mouth daily for 2 days (Patient not taking: Reported on 12/30/2020)   tinidazole (TINDAMAX) 500 MG tablet Take 4 tablets (2,000 mg total) by mouth daily with breakfast. (Patient not taking: Reported on 04/02/2019)   Vitamin D, Ergocalciferol, (DRISDOL) 1.25 MG (50000 UT) CAPS capsule Take 1 capsule (50,000 Units  total) by mouth every 7 (seven) days. (Patient not taking: Reported on 04/02/2019)   No facility-administered encounter medications on file as of 12/30/2020.    Allergies:  Allergies  Allergen Reactions   Ciprofloxacin Other (See Comments)    Generalized muscle and joint pain.   Morphine And Related Itching and Nausea And Vomiting   Penicillins Hives    Has patient had a PCN reaction causing immediate rash, facial/tongue/throat swelling, SOB or lightheadedness with hypotension: No Has patient had a PCN reaction causing severe rash involving mucus membranes or skin necrosis: No Has patient had a PCN reaction that required hospitalization No Has patient had a PCN reaction occurring within the last 10 years: No If all of the above answers are "NO", then may proceed with Cephalosporin use.     Family History: Family History  Problem Relation Age of Onset   Breast cancer Mother 19   Hypertension Mother    Hyperlipidemia Mother    CAD Mother    Hypertension Father     Social History: Social History   Tobacco Use   Smoking status: Every Day    Packs/day: 0.50    Years: 19.00    Pack years: 9.50    Types: Cigarettes   Smokeless tobacco: Never  Substance Use Topics   Alcohol use: No   Drug use: Yes    Types: Marijuana   Social History   Social History Narrative   Children: 3 boys and 1 girl.    Right Handed    Lives in a two story home     Vital Signs:  BP (!) 165/98   Pulse (!) 108   Ht 5' (1.524 m)   Wt 144 lb (65.3 kg)   SpO2 98%   BMI 28.12 kg/m   Neurological Exam: MENTAL STATUS including orientation to time, place, person, recent and remote memory, attention span and concentration, language, and fund of knowledge is normal.  Speech is not dysarthric.  CRANIAL NERVES: II:  No visual field defects.   III-IV-VI: Pupils equal round and reactive to light.  Normal conjugate, extra-ocular eye movements in all directions of gaze.  No nystagmus.  No ptosis.   V:   Normal facial sensation.    VII:  Normal facial symmetry and movements.   VIII:  Normal hearing and vestibular function.   IX-X:  Normal palatal movement.   XI:  Normal shoulder shrug and head rotation.   XII:  Normal tongue strength and range of motion, no deviation or fasciculation.  MOTOR:  No atrophy, fasciculations or abnormal movements.  No pronator drift.  Tenderness with palpation over the right wrist, especially the ulnar-side and dorsum.  *strength testing limited by pain Upper Extremity:  Right  Left  Deltoid  5/5   5*/5  Biceps  5/5   5/5   Triceps  5/5   5/5   Wrist extensors  5*/5   5/5   Wrist flexors  5/5   5/5   Finger extensors  4*/5   5/5   Finger flexors  4*/5   5/5   Dorsal interossei  4*/5   5/5   Abductor pollicis  5/5   5/5   Tone (Ashworth scale)  0  0   Lower Extremity:  Right  Left  Hip flexors  5/5   5/5   Hip extensors  5/5   5/5   Knee extensors  5/5   5/5   Dorsiflexors  5/5   5/5   Plantarflexors  5/5   5/5   Tone (Ashworth scale)  0  0   MSRs:  Right        Left                  brachioradialis 2+  2+  biceps 2+  2+  triceps 2+  2+  patellar 2+  2+  ankle jerk 2+  2+  Hoffman no  no  plantar response down  down   SENSORY:  Reduced pin prick over the right medial forearm and hand.  Otherwise, normal and symmetric perception of light touch, pinprick, vibration  COORDINATION/GAIT: Normal finger-to- nose-finger.  Intact rapid alternating movements bilaterally.   Gait narrow based and stable.   IMPRESSION/PLAN: Right hand and forearm paresthesias. Need evaluate for ulnar neuropathy, but I explained that even if she did have nerve entrapment, the nature of her pain is out of proportion for what is seen with ulnar neuropathy. Therefore, I recommend that she also seek the opinion of Sports Medicine to evaluate her right wrist pain ?tendinopathy vs other MSK pain. She will return for NCS/EMG of the arms.  Symptoms are not suggestive of carpal  tunnel syndrome.   Further recommendations pending results.   Thank you for allowing me to participate in patient's care.  If I can answer any additional questions, I would be pleased to do so.    Sincerely,    Ivionna Verley K. Allena Katz, DO

## 2020-12-30 NOTE — Patient Instructions (Signed)
Nerve testing of the arms  We will refer you to sports medicine for right wrist pain and left arm pain  ELECTROMYOGRAM AND NERVE CONDUCTION STUDIES (EMG/NCS) INSTRUCTIONS  How to Prepare The neurologist conducting the EMG will need to know if you have certain medical conditions. Tell the neurologist and other EMG lab personnel if you: Have a pacemaker or any other electrical medical device Take blood-thinning medications Have hemophilia, a blood-clotting disorder that causes prolonged bleeding Bathing Take a shower or bath shortly before your exam in order to remove oils from your skin. Don't apply lotions or creams before the exam.  What to Expect You'll likely be asked to change into a hospital gown for the procedure and lie down on an examination table. The following explanations can help you understand what will happen during the exam.  Electrodes. The neurologist or a technician places surface electrodes at various locations on your skin depending on where you're experiencing symptoms. Or the neurologist may insert needle electrodes at different sites depending on your symptoms.  Sensations. The electrodes will at times transmit a tiny electrical current that you may feel as a twinge or spasm. The needle electrode may cause discomfort or pain that usually ends shortly after the needle is removed. If you are concerned about discomfort or pain, you may want to talk to the neurologist about taking a short break during the exam.  Instructions. During the needle EMG, the neurologist will assess whether there is any spontaneous electrical activity when the muscle is at rest - activity that isn't present in healthy muscle tissue - and the degree of activity when you slightly contract the muscle.  He or she will give you instructions on resting and contracting a muscle at appropriate times. Depending on what muscles and nerves the neurologist is examining, he or she may ask you to change positions  during the exam.  After your EMG You may experience some temporary, minor bruising where the needle electrode was inserted into your muscle. This bruising should fade within several days. If it persists, contact your primary care doctor.

## 2021-01-04 ENCOUNTER — Ambulatory Visit (INDEPENDENT_AMBULATORY_CARE_PROVIDER_SITE_OTHER): Payer: 59 | Admitting: Family Medicine

## 2021-01-04 ENCOUNTER — Other Ambulatory Visit: Payer: Self-pay

## 2021-01-04 ENCOUNTER — Ambulatory Visit: Payer: Self-pay

## 2021-01-04 ENCOUNTER — Encounter: Payer: Self-pay | Admitting: Family Medicine

## 2021-01-04 VITALS — BP 130/86 | HR 93 | Ht 60.0 in | Wt 146.4 lb

## 2021-01-04 DIAGNOSIS — M25531 Pain in right wrist: Secondary | ICD-10-CM | POA: Diagnosis not present

## 2021-01-04 DIAGNOSIS — G5621 Lesion of ulnar nerve, right upper limb: Secondary | ICD-10-CM | POA: Insufficient documentation

## 2021-01-04 DIAGNOSIS — M778 Other enthesopathies, not elsewhere classified: Secondary | ICD-10-CM | POA: Insufficient documentation

## 2021-01-04 NOTE — Progress Notes (Signed)
Subjective:    CC: R wrist pain  I, Rachel Fischer, LAT, ATC, am serving as scribe for Dr. Clementeen Graham.  HPI: Pt is a 50 y/o female presenting w/ c/o R wrist and hand pain and paresthesias in her R fingers 3-5 since July 2022 w no known MOI.  She has been seen by her PCP and by neurology for these symptoms  on 12/30/20.  Nerve conduction scheduled for December.  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  She locates her pain to her pain to her R ulnar-sided hand, wrist and forearm.  She describes her pain as a sharp, stabbing pain in her R hand and burning pain in her R ulnar-sided forearm.  Radiating pain: yes into her R ulnar-sided forearm R wrist/hand swelling: No R wrist/hand numbness/tingling: yes in fingers 3-5 R hand weakness: yes Aggravating factors: sleeping; using her R hand and wrist; extending her R hand and fingers Treatments tried: Gabapentin 600mg  bid; Tizanidine but not taking this any longer; Flexeril; Aleve  Diagnostic testing: R UE NCV/EMG- scheduled for 01/26/21; c-spine XR- 10/11/17  Pertinent review of Systems: No fevers or chills  Relevant historical information: Possible thyroid disease.  Hypertension.   Objective:    Vitals:   01/04/21 0934  BP: 130/86  Pulse: 93  SpO2: 98%   General: Well Developed, well nourished, and in no acute distress.   MSK: Right elbow normal.  Normal motion.  Mildly tender palpation lateral epicondyle and lateral forearm. Nontender medial epicondyle.  Normal elbow motion.  Minimally positive Tinel's cubital tunnel.  Right wrist normal-appearing normal motion.  Tender palpation dorsal wrist and fourth dorsal wrist compartment and especially at 6 dorsal wrist compartment. Pain with resisted wrist extension and ulnar deviation. Pain also present on the volar wrist with wrist flexion.  Lab and Radiology Results Diagnostic Limited MSK Ultrasound of: Right wrist and elbow Dorsal wrist.  Hypoechoic fluid tracks in the tendon sheath  and ECU and fourth dorsal wrist compartment indicating tendinitis. Carpal tunnel: Median nerve slightly enlarged measuring 16 mm cross-sectional area. At [facility] canal ulnar nerve slightly enlarged.  Small ganglion cyst measuring 3 mm present. Right elbow: Ulnar nerve at cubital tunnel slightly enlarged without subluxation with elbow flexion with dynamic testing. Impression:  Right wrist: Dorsal wrist tendinitis especially at ECU possibly at fourth dorsal wrist compartment. Probable ulnar neuritis at cubital tunnel and possibly at Guyon's canal and possible median neuritis carpal tunnel. Small ganglion cyst at volar ulnar wrist  Procedure: Real-time Ultrasound Guided Injection of right dorsal wrist ECU tendon sheath at 6th dorsal wrist compartment Device: Philips Affiniti 50G Images permanently stored and available for review in PACS Verbal informed consent obtained.  Discussed risks and benefits of procedure. Warned about infection bleeding damage to structures skin hypopigmentation and fat atrophy among others. Patient expresses understanding and agreement Time-out conducted.   Noted no overlying erythema, induration, or other signs of local infection.   Skin prepped in a sterile fashion.   Local anesthesia: Topical Ethyl chloride.   With sterile technique and under real time ultrasound guidance: 40 mg of Kenalog and 1 mL of lidocaine injected into tendon sheath. Fluid seen entering the tendon sheath.   Completed without difficulty   Pain at the dorsal wrist and at the lateral forearm immediately resolved suggesting accurate placement of the medication.   Advised to call if fevers/chills, erythema, induration, drainage, or persistent bleeding.   Images permanently stored and available for review in the ultrasound unit.  Impression: Technically successful ultrasound guided injection.        Impression and Recommendations:    Assessment and Plan: 50 y.o. female with right wrist and  forearm pain.  Multifactorial.. I agree with Dr. Eliane Decree note.  I think some of her pain probably is due to potential ulnar nerve impingement and will look forward to the results of the nerve conduction study.  However her pain is more than what he expected.  Based on physical exam and ultrasound evaluation she has ECU tendinitis and probably tendinitis of the first dorsal wrist compartment.  Additionally there is a small ganglion cyst at the volar wrist.  She had great immediate response to injection and ECU tendon sheath which resolved the burning pain that she experiences in her forearm indicating this pain is very likely due to the tendinitis and not due to a nerve issue.  Hopefully she will have a long-term results from the steroid component of the injection.  Will refer to hand PT as this should be helpful.  Additionally recommend using a cubital tunnel night splint.  Recheck in 1 month.  The results of the nerve conduction study should be available by then.  PDMP not reviewed this encounter. Orders Placed This Encounter  Procedures   [place] LIMITED JOINT SPACE STRUCTURES UP RIGHT(NO LINKED CHARGES)    Order Specific Question:   Reason for Exam (SYMPTOM  OR DIAGNOSIS REQUIRED)    Answer:   R wrist pain    Order Specific Question:   Preferred imaging location?    Answer:   [facility] referral to Physical Therapy    Referral Priority:   Routine    Referral Type:   Physical Medicine    Referral Reason:   Specialty Services Required    Requested Specialty:   Physical Therapy    Number of Visits Requested:   1   No orders of the defined types were placed in this encounter.   Discussed warning signs or symptoms. Please see discharge instructions. Patient expresses understanding.   The above documentation has been reviewed and is accurate and complete Clementeen Graham, M.D.

## 2021-01-04 NOTE — Patient Instructions (Addendum)
Nice to meet you today.  Extensor carpi ulnaris tendinopathy/tendinitis.  You had a R wrist injection. .Call or go to the ER if you develop a large red swollen joint with extreme pain or oozing puss.   Wear a cubital tunnel brace at night.  Use a thumb loop at work  I've referred you to hand therapy.  Please let us know if you don't hear from them in one week regarding scheduling.  Please use Voltaren gel (Generic Diclofenac Gel) up to 4x daily for pain as needed.  This is available over-the-counter as both the name brand Voltaren gel and the generic diclofenac gel.   Follow-up: one month

## 2021-01-22 ENCOUNTER — Ambulatory Visit: Payer: Self-pay | Admitting: Neurology

## 2021-01-26 ENCOUNTER — Encounter: Payer: 59 | Admitting: Neurology

## 2021-01-27 NOTE — Progress Notes (Deleted)
   I, Christoper Fabian, LAT, ATC, am serving as scribe for Dr. Clementeen Graham.  Rachel Fischer is a 50 y.o. female who presents to [facility] today for f/u of R wrist and hand pain and paresthesias in her R fingers 3-5 since July 2022 w no known MOI.  She was last seen by Dr. Denyse Amass on 01/04/21 and had a R ECU tendon sheath steroid injection.  She was advised to use a cubital tunnel brace at night and thumb loop at work.  She was also referred to hand therapy.  Today, pt reports   Diagnostic testing: R UE NCV/EMG ordered  Pertinent review of systems: ***  Relevant historical information: ***   Exam:  There were no vitals taken for this visit. General: Well Developed, well nourished, and in no acute distress.   MSK: ***    Lab and Radiology Results No results found for this or any previous visit (from the past 72 hour(s)). No results found.     Assessment and Plan: 50 y.o. female with ***   PDMP not reviewed this encounter. No orders of the defined types were placed in this encounter.  No orders of the defined types were placed in this encounter.    Discussed warning signs or symptoms. Please see discharge instructions. Patient expresses understanding.   ***

## 2021-02-01 ENCOUNTER — Ambulatory Visit: Payer: 59 | Admitting: Family Medicine

## 2021-02-09 ENCOUNTER — Other Ambulatory Visit: Payer: Self-pay

## 2021-02-09 ENCOUNTER — Ambulatory Visit: Payer: 59 | Admitting: Neurology

## 2021-02-09 DIAGNOSIS — G5621 Lesion of ulnar nerve, right upper limb: Secondary | ICD-10-CM

## 2021-02-09 DIAGNOSIS — M25531 Pain in right wrist: Secondary | ICD-10-CM

## 2021-02-09 DIAGNOSIS — G5601 Carpal tunnel syndrome, right upper limb: Secondary | ICD-10-CM

## 2021-02-09 NOTE — Procedures (Addendum)
[facility]  [address] [place], [address]  [place], [place] [address] Tel: [phone] Fax:  [phone] Test Date:  02/09/2021  Patient: Rachel Fischer DOB: April 08, 1970 Physician: Nita Sickle, DO  Sex: Female Height: 5' " Ref Phys: Nita Sickle, DO  ID#: [id number]   Technician:    Patient Complaints: This is a 50 year old female referred for evaluation of right hand paresthesias.  NCV & EMG Findings: Extensive electrodiagnostic testing of the right upper extremity shows:  Right mixed palmar sensory responses show prolonged latency.  Bilateral median, bilateral ulnar, and left mixed palmar sensory responses are within normal limits. Right ulnar motor response shows slowed conduction velocity across the elbow (A Elbow-B Elbow, 45 m/s).  Left ulnar and bilateral median motor responses are within normal limits.  There is no evidence of active or chronic motor axonal loss changes affecting any of the tested muscles.  Motor unit configuration and recruitment pattern is within normal limits.    Impression: Right ulnar neuropathy with slowing across the elbow, demyelinating, mild. Right median neuropathy at or distal to the wrist, consistent with a clinical diagnosis of carpal tunnel syndrome.  Overall, these findings are very mild in degree electrically. Electrodiagnostic testing of the left upper extremity is within normal limits   ___________________________ Nita Sickle, DO    Nerve Conduction Studies Anti Sensory Summary Table   Stim Site NR Peak (ms) Norm Peak (ms) P-T Amp (V) Norm P-T Amp  Left Median Anti Sensory (2nd Digit)  34C  Wrist    3.3 <3.4 33.1 >20  Right Median Anti Sensory (2nd Digit)  34C  Wrist    2.9 <3.4 36.4 >20  Left Ulnar Anti Sensory (5th Digit)  34C  Wrist    2.7 <3.1 28.1 >12  Right Ulnar Anti Sensory (5th Digit)  34C  Wrist    2.9 <3.1 28.8 >12   Motor Summary Table   Stim Site NR Onset (ms) Norm Onset (ms) O-P Amp (mV) Norm O-P Amp  Site1 Site2 Delta-0 (ms) Dist (cm) Vel (m/s) Norm Vel (m/s)  Left Median Motor (Abd Poll Brev)  34C  Wrist    3.2 <3.9 13.2 >6 Elbow Wrist 4.5 27.0 60 >50  Elbow    7.7  13.2         Right Median Motor (Abd Poll Brev)  34C  Wrist    3.3 <3.9 10.6 >6 Elbow Wrist 4.6 27.0 59 >50  Elbow    7.9  9.8         Left Ulnar Motor (Abd Dig Minimi)  34C  Wrist    2.2 <3.1 10.3 >7 B Elbow Wrist 3.2 20.0 63 >50  B Elbow    5.4  10.2  A Elbow B Elbow 1.6 10.0 63 >50  A Elbow    7.0  10.0         Right Ulnar Motor (Abd Dig Minimi)  34C  Wrist    2.1 <3.1 12.0 >7 B Elbow Wrist 3.1 20.0 65 >50  B Elbow    5.2  11.7  A Elbow B Elbow 2.2 10.0 45 >50  A Elbow    7.4  11.6          Comparison Summary Table   Stim Site NR Peak (ms) Norm Peak (ms) P-T Amp (V) Site1 Site2 Delta-P (ms) Norm Delta (ms)  Left Median/Ulnar Palm Comparison (Wrist - 8cm)  34C  Median Palm    1.8 <2.2 70.2 Median Palm Ulnar Palm 0.1  Ulnar Palm    1.7 <2.2 22.2      Right Median/Ulnar Palm Comparison (Wrist - 8cm)  34C  Median Palm    1.8 <2.2 37.6 Median Palm Ulnar Palm 0.5   Ulnar Palm    1.3 <2.2 17.0       EMG   Side Muscle Ins Act Fibs Psw Fasc Number Recrt Dur Dur. Amp Amp. Poly Poly. Comment  Right 1stDorInt Nml Nml Nml Nml Nml Nml Nml Nml Nml Nml Nml Nml N/A  Right Abd Poll Brev Nml Nml Nml Nml Nml Nml Nml Nml Nml Nml Nml Nml N/A  Right PronatorTeres Nml Nml Nml Nml Nml Nml Nml Nml Nml Nml Nml Nml N/A  Right Biceps Nml Nml Nml Nml Nml Nml Nml Nml Nml Nml Nml Nml N/A  Right Triceps Nml Nml Nml Nml Nml Nml Nml Nml Nml Nml Nml Nml N/A  Right Deltoid Nml Nml Nml Nml Nml Nml Nml Nml Nml Nml Nml Nml N/A  Right FlexCarpiUln Nml Nml Nml Nml Nml Nml Nml Nml Nml Nml Nml Nml N/A  Left 1stDorInt Nml Nml Nml Nml Nml Nml Nml Nml Nml Nml Nml Nml N/A  Left PronatorTeres Nml Nml Nml Nml Nml Nml Nml Nml Nml Nml Nml Nml N/A  Left Biceps Nml Nml Nml Nml Nml Nml Nml Nml Nml Nml Nml Nml N/A  Left Triceps Nml Nml Nml Nml Nml Nml Nml  Nml Nml Nml Nml Nml N/A  Left Deltoid Nml Nml Nml Nml Nml Nml Nml Nml Nml Nml Nml Nml N/A      Waveforms:

## 2021-02-28 ENCOUNTER — Encounter (HOSPITAL_BASED_OUTPATIENT_CLINIC_OR_DEPARTMENT_OTHER): Payer: Self-pay | Admitting: Emergency Medicine

## 2021-02-28 ENCOUNTER — Inpatient Hospital Stay (HOSPITAL_BASED_OUTPATIENT_CLINIC_OR_DEPARTMENT_OTHER)
Admission: EM | Admit: 2021-02-28 | Discharge: 2021-03-02 | DRG: 305 | Disposition: A | Discharge status: Home / Self Care | Payer: 59 | Attending: Critical Care Medicine | Admitting: Critical Care Medicine

## 2021-02-28 ENCOUNTER — Emergency Department (HOSPITAL_BASED_OUTPATIENT_CLINIC_OR_DEPARTMENT_OTHER): Payer: 59

## 2021-02-28 ENCOUNTER — Emergency Department (HOSPITAL_BASED_OUTPATIENT_CLINIC_OR_DEPARTMENT_OTHER): Payer: 59 | Admitting: Radiology

## 2021-02-28 ENCOUNTER — Other Ambulatory Visit: Payer: Self-pay

## 2021-02-28 DIAGNOSIS — Z83438 Family history of other disorder of lipoprotein metabolism and other lipidemia: Secondary | ICD-10-CM

## 2021-02-28 DIAGNOSIS — F1290 Cannabis use, unspecified, uncomplicated: Secondary | ICD-10-CM | POA: Diagnosis present

## 2021-02-28 DIAGNOSIS — E785 Hyperlipidemia, unspecified: Secondary | ICD-10-CM | POA: Diagnosis present

## 2021-02-28 DIAGNOSIS — G629 Polyneuropathy, unspecified: Secondary | ICD-10-CM | POA: Diagnosis present

## 2021-02-28 DIAGNOSIS — R079 Chest pain, unspecified: Secondary | ICD-10-CM | POA: Diagnosis not present

## 2021-02-28 DIAGNOSIS — Z79899 Other long term (current) drug therapy: Secondary | ICD-10-CM

## 2021-02-28 DIAGNOSIS — E876 Hypokalemia: Secondary | ICD-10-CM | POA: Diagnosis present

## 2021-02-28 DIAGNOSIS — F1210 Cannabis abuse, uncomplicated: Secondary | ICD-10-CM | POA: Diagnosis not present

## 2021-02-28 DIAGNOSIS — I10 Essential (primary) hypertension: Secondary | ICD-10-CM | POA: Diagnosis present

## 2021-02-28 DIAGNOSIS — F17210 Nicotine dependence, cigarettes, uncomplicated: Secondary | ICD-10-CM | POA: Diagnosis present

## 2021-02-28 DIAGNOSIS — Z885 Allergy status to narcotic agent status: Secondary | ICD-10-CM

## 2021-02-28 DIAGNOSIS — Z880 Allergy status to penicillin: Secondary | ICD-10-CM

## 2021-02-28 DIAGNOSIS — R072 Precordial pain: Secondary | ICD-10-CM | POA: Diagnosis present

## 2021-02-28 DIAGNOSIS — Z803 Family history of malignant neoplasm of breast: Secondary | ICD-10-CM | POA: Diagnosis not present

## 2021-02-28 DIAGNOSIS — R110 Nausea: Secondary | ICD-10-CM | POA: Diagnosis present

## 2021-02-28 DIAGNOSIS — Z8249 Family history of ischemic heart disease and other diseases of the circulatory system: Secondary | ICD-10-CM

## 2021-02-28 DIAGNOSIS — G43909 Migraine, unspecified, not intractable, without status migrainosus: Secondary | ICD-10-CM | POA: Diagnosis present

## 2021-02-28 DIAGNOSIS — I160 Hypertensive urgency: Principal | ICD-10-CM | POA: Diagnosis present

## 2021-02-28 DIAGNOSIS — Z881 Allergy status to other antibiotic agents status: Secondary | ICD-10-CM | POA: Diagnosis not present

## 2021-02-28 DIAGNOSIS — Z20822 Contact with and (suspected) exposure to covid-19: Secondary | ICD-10-CM | POA: Diagnosis present

## 2021-02-28 DIAGNOSIS — Z8719 Personal history of other diseases of the digestive system: Secondary | ICD-10-CM

## 2021-02-28 DIAGNOSIS — R0789 Other chest pain: Secondary | ICD-10-CM | POA: Diagnosis not present

## 2021-02-28 LAB — CBC
HCT: 39.3 % (ref 36.0–46.0)
Hemoglobin: 12.8 g/dL (ref 12.0–15.0)
MCH: 27.8 pg (ref 26.0–34.0)
MCHC: 32.6 g/dL (ref 30.0–36.0)
MCV: 85.2 fL (ref 80.0–100.0)
Platelets: 324 10*3/uL (ref 150–400)
RBC: 4.61 MIL/uL (ref 3.87–5.11)
RDW: 13.2 % (ref 11.5–15.5)
WBC: 4.8 10*3/uL (ref 4.0–10.5)
nRBC: 0 % (ref 0.0–0.2)

## 2021-02-28 LAB — RESP PANEL BY RT-PCR (FLU A&B, COVID) ARPGX2
Influenza A by PCR: NEGATIVE
Influenza B by PCR: NEGATIVE
SARS Coronavirus 2 by RT PCR: NEGATIVE

## 2021-02-28 LAB — COMPREHENSIVE METABOLIC PANEL
ALT: 10 U/L (ref 0–44)
AST: 15 U/L (ref 15–41)
Albumin: 4.5 g/dL (ref 3.5–5.0)
Alkaline Phosphatase: 71 U/L (ref 38–126)
Anion gap: 10 (ref 5–15)
BUN: 9 mg/dL (ref 6–20)
CO2: 25 mmol/L (ref 22–32)
Calcium: 9.1 mg/dL (ref 8.9–10.3)
Chloride: 104 mmol/L (ref 98–111)
Creatinine, Ser: 0.73 mg/dL (ref 0.44–1.00)
GFR, Estimated: >60 mL/min (ref >60)
Glucose, Bld: 98 mg/dL (ref 70–99)
Potassium: 3.8 mmol/L (ref 3.5–5.1)
Sodium: 139 mmol/L (ref 135–145)
Total Bilirubin: 0.4 mg/dL (ref 0.3–1.2)
Total Protein: 7.1 g/dL (ref 6.5–8.1)

## 2021-02-28 LAB — HCG, SERUM, QUALITATIVE: Preg, Serum: NEGATIVE

## 2021-02-28 LAB — TROPONIN I (HIGH SENSITIVITY)
Troponin I (High Sensitivity): 2 ng/L (ref <18)
Troponin I (High Sensitivity): 2 ng/L (ref <18)

## 2021-02-28 LAB — MRSA NEXT GEN BY PCR, NASAL: MRSA by PCR Next Gen: NOT DETECTED

## 2021-02-28 LAB — GLUCOSE, CAPILLARY: Glucose-Capillary: 111 mg/dL — ABNORMAL HIGH (ref 70–99)

## 2021-02-28 LAB — D-DIMER, QUANTITATIVE: D-Dimer, Quant: <0.27 ug/mL-FEU (ref 0.00–0.50)

## 2021-02-28 MED ORDER — NIFEDIPINE ER OSMOTIC RELEASE 30 MG PO TB24
60.0000 mg | ORAL_TABLET | Freq: Once | ORAL | Status: AC
  Administered 2021-02-28: 60 mg via ORAL
  Filled 2021-02-28: qty 2

## 2021-02-28 MED ORDER — GABAPENTIN 600 MG PO TABS
600.0000 mg | ORAL_TABLET | Freq: Two times a day (BID) | ORAL | Status: DC
  Administered 2021-02-28 – 2021-03-02 (×4): 600 mg via ORAL
  Filled 2021-02-28 (×4): qty 1

## 2021-02-28 MED ORDER — ACETAMINOPHEN 325 MG PO TABS
650.0000 mg | ORAL_TABLET | Freq: Four times a day (QID) | ORAL | Status: DC | PRN
  Administered 2021-02-28: 650 mg via ORAL
  Filled 2021-02-28: qty 2

## 2021-02-28 MED ORDER — IBUPROFEN 800 MG PO TABS
800.0000 mg | ORAL_TABLET | Freq: Once | ORAL | Status: AC
  Administered 2021-02-28: 800 mg via ORAL
  Filled 2021-02-28: qty 1

## 2021-02-28 MED ORDER — HEPARIN SODIUM (PORCINE) 5000 UNIT/ML IJ SOLN
5000.0000 [IU] | Freq: Three times a day (TID) | INTRAMUSCULAR | Status: DC
  Administered 2021-02-28 – 2021-03-02 (×5): 5000 [IU] via SUBCUTANEOUS
  Filled 2021-02-28 (×5): qty 1

## 2021-02-28 MED ORDER — IOHEXOL 350 MG/ML SOLN
100.0000 mL | Freq: Once | INTRAVENOUS | Status: AC | PRN
  Administered 2021-02-28: 100 mL via INTRAVENOUS

## 2021-02-28 MED ORDER — AMLODIPINE BESYLATE 5 MG PO TABS
5.0000 mg | ORAL_TABLET | Freq: Every day | ORAL | Status: DC
  Administered 2021-03-01: 5 mg via ORAL
  Filled 2021-02-28 (×2): qty 1

## 2021-02-28 MED ORDER — ORAL CARE MOUTH RINSE
15.0000 mL | Freq: Two times a day (BID) | OROMUCOSAL | Status: DC
  Administered 2021-02-28 – 2021-03-02 (×4): 15 mL via OROMUCOSAL

## 2021-02-28 MED ORDER — CHLORHEXIDINE GLUCONATE CLOTH 2 % EX PADS
6.0000 | MEDICATED_PAD | Freq: Every day | CUTANEOUS | Status: DC
  Administered 2021-02-28 – 2021-03-01 (×2): 6 via TOPICAL

## 2021-02-28 MED ORDER — METOPROLOL TARTRATE 5 MG/5ML IV SOLN
5.0000 mg | Freq: Once | INTRAVENOUS | Status: AC
  Administered 2021-02-28: 5 mg via INTRAVENOUS
  Filled 2021-02-28: qty 5

## 2021-02-28 MED ORDER — PANTOPRAZOLE SODIUM 40 MG PO TBEC
40.0000 mg | DELAYED_RELEASE_TABLET | Freq: Every day | ORAL | Status: DC
  Administered 2021-02-28 – 2021-03-02 (×3): 40 mg via ORAL
  Filled 2021-02-28 (×3): qty 1

## 2021-02-28 MED ORDER — NITROGLYCERIN IN D5W 200-5 MCG/ML-% IV SOLN
0.0000 ug/min | INTRAVENOUS | Status: DC
  Administered 2021-02-28: 15 ug/min via INTRAVENOUS
  Administered 2021-02-28: 5 ug/min via INTRAVENOUS
  Administered 2021-02-28: 10 ug/min via INTRAVENOUS
  Filled 2021-02-28: qty 250

## 2021-02-28 MED ORDER — LIDOCAINE 5 % EX PTCH
1.0000 | MEDICATED_PATCH | CUTANEOUS | Status: DC
Start: 2021-02-28 — End: 2021-03-02
  Administered 2021-02-28: 1 via TRANSDERMAL
  Filled 2021-02-28 (×2): qty 1

## 2021-02-28 MED ORDER — NITROGLYCERIN 0.4 MG SL SUBL
0.4000 mg | SUBLINGUAL_TABLET | SUBLINGUAL | Status: DC | PRN
  Administered 2021-02-28: 0.4 mg via SUBLINGUAL
  Filled 2021-02-28: qty 1

## 2021-02-28 NOTE — ED Notes (Signed)
[facility] is here to transport pt.  Pt is alert and oriented.

## 2021-02-28 NOTE — ED Triage Notes (Signed)
Pt c/o left sided chest pain for 3 days. Pt has tried over the counter antacid without relief.

## 2021-02-28 NOTE — H&P (Signed)
NAME:  Rachel Fischer, MRN:  [id number], DOB:  [birth year], LOS: 0 ADMISSION DATE:  02/28/2021, CONSULTATION DATE:  02/28/2021 REFERRING MD:  Lorinda Creed, PA, CHIEF COMPLAINT:  HTN/ CP   History of Present Illness:   51 year old female with prior history significant for HTN on home norvasc who presented to [facility] ER with 3 day complaint of left sided chest pain.  Pain described behind left breast, sharp pressure, exacerbated with movement and exertion with associated nausea, shortness of breath, and poor appetite.  Denied any fever, chills, vomiting, dizziness, headaches, abd pain, or weakness.  States still taking taking home norvasc  In ER, patient afebrile, NSR, initial BP 187/103, and normal O2 saturations. Workup mostly benign with normal troponin hs x 2, negative d-dimer, and unremarkable CBC and CMET.  EKG NSR rate 87.  CXR negative.   She was treated with lopressor 5mg  which actually caused worsening hypertension.  Then was given nifedipine which had no effect on BP.  She continued to have left sided chest pain and therefore given nitro SL which alleviated chest pain but ongoing hypertensive.  ER consulted with Cardiology with recommendations for observation.  She was started on a nitro drip.  Pending CTA chest to rule out dissection.  She is to be transferred to [facility] admitting.   Pertinent  Medical History  Tobacco abuse, anemia, HTN, HLD, migraines, depression, R carpel tunnel/ neuropathy  Significant Hospital Events: Including procedures, antibiotic start and stop dates in addition to other pertinent events   1/8 Admitted with chest pain/ HTN urgency on nitro gtt  Interim History / Subjective:  See above  Subjective: endorses sharp chest pain under left breast. Endorses some SOB. Endorses some fatigue. States that she "feels better" compared to earlier. Denies any bright or dark bleeding from mouth or rectum. Denies abdominal pain  Objective   Blood pressure (!) 154/90, pulse  67, temperature 98.3 F (36.8 C), temperature source Oral, resp. rate 18, height 5' (1.524 m), weight 64.1 kg, SpO2 100 %.       No intake or output data in the 24 hours ending 02/28/21 2029 Filed Weights   02/28/21 1147 02/28/21 2025  Weight: 66.2 kg 64.1 kg    Examination: General: In bed, NAD, appears comfortable HEENT: MM pink/moist, anicteric, atraumatic Neuro: RASS 0, PERRL 56mm, GCS 15 CV: S1S2, NSR, no m/r/g appreciated PULM:  clear in the upper lobes, clear in the lower lobes, trachea midline, chest expansion symmetric GI: soft, bsx4 active, non-tender   Extremities: warm/dry, no pretibial edema, capillary refill less than 3 seconds  Skin:  no rashes or lesions noted  12 lead with no ST changes noted Trop 2>2 CBC- reviewed BMP-Reviewed Ddimer-wnl COVID flu neg CXR: no pneumo, effusion, infiltrate CTA negative for aortic dissection and aneurysm. No acute intra abdominal or pelvic abnormality  Resolved Hospital Problem list     Assessment & Plan:  Hypertensive urgency  Left sided chest pain  Cardiac workup unrevealing thus far. Trop neg, 12 lead with no ST changes, CXR no pneumo, effusion, infiltrate, no leukocytosis. CTA chest negative for aortic dissection and aneurysm. No acute intra abdominal or pelvic abnormality.  On RA - Admit to ICU telemetry monitoring  - cardiology consulted by EDP. Follow up recs. Appreciate assistance with workup. - continue Nitro gtt for goal reduction of 10-20% in BP.  Goal SBP 150-160 overnight. Titrate to goal - Start amlodipine 5mg  - send UDS (admits to [facility], possibly laced?) given worsening HTN  with BB - noted on CT abd/pelvis to have right adrenal nodule 12 x 7 mm in 2018-> Unremarkable on CTA per radiologist. - consider renal artery workup - check TSH -trend troponin -PRN 12 lead  Carpel tunnel/ neuropathy - continue home Neurontin   Active smoker- tobacco and mariajuana -cessation education when appropriate   Best  Practice (right click and "Reselect all SmartList Selections" daily)   Diet/type: NPO w/ oral meds DVT prophylaxis: prophylactic heparin  GI prophylaxis: PPI Lines: N/A Foley:  N/A Code Status:  full code Last date of multidisciplinary goals of care discussion [Pending]  Labs   CBC: Recent Labs  Lab 02/28/21 1239  WBC 4.8  HGB 12.8  HCT 39.3  MCV 85.2  PLT 0000000    Basic Metabolic Panel: Recent Labs  Lab 02/28/21 1239  NA 139  K 3.8  CL 104  CO2 25  GLUCOSE 98  BUN 9  CREATININE 0.73  CALCIUM 9.1   GFR: Estimated Creatinine Clearance: 71.8 mL/min (by C-G formula based on SCr of 0.73 mg/dL). Recent Labs  Lab 02/28/21 1239  WBC 4.8    Liver Function Tests: Recent Labs  Lab 02/28/21 1239  AST 15  ALT 10  ALKPHOS 71  BILITOT 0.4  PROT 7.1  ALBUMIN 4.5   No results for input(s): LIPASE, AMYLASE in the last 168 hours. No results for input(s): AMMONIA in the last 168 hours.  ABG    Component Value Date/Time   TCO2 26 08/06/2015 1248     Coagulation Profile: No results for input(s): INR, PROTIME in the last 168 hours.  Cardiac Enzymes: No results for input(s): CKTOTAL, CKMB, CKMBINDEX, TROPONINI in the last 168 hours.  HbA1C: Hemoglobin A1C  Date/Time Value Ref Range Status  05/18/2018 01:20 PM 5.4 4.0 - 5.6 % Final   Hgb A1c MFr Bld  Date/Time Value Ref Range Status  08/06/2015 07:55 PM 5.3 4.8 - 5.6 % Final    Comment:    (NOTE)         Pre-diabetes: 5.7 - 6.4         Diabetes: >6.4         Glycemic control for adults with diabetes: <7.0     CBG: No results for input(s): GLUCAP in the last 168 hours.  Review of Systems:   Positives in bold   Gen: fever, chills, weight change, fatigue, night sweats HEENT:  blurred vision, double vision, hearing loss, tinnitus, sinus congestion, rhinorrhea, sore throat, neck stiffness, dysphagia PULM:  shortness of breath, cough, sputum production, hemoptysis, wheezing CV: chest pain, edema,  orthopnea, paroxysmal nocturnal dyspnea, palpitations GI:  abdominal pain, nausea, vomiting, diarrhea, hematochezia, melena, constipation, change in bowel habits GU: dysuria, hematuria, polyuria, oliguria, urethral discharge Endocrine: hot or cold intolerance, polyuria, polyphagia or appetite change Derm: rash, dry skin, scaling or peeling skin change Heme: easy bruising, bleeding, bleeding gums Neuro: headache, numbness, weakness, slurred speech, loss of memory or consciousness   Past Medical History:  She,  has a past medical history of Anemia, Colon polyps, Decreased thyroid stimulating hormone (TSH) level, Depression, trichomoniasis, Hyperlipidemia, Hypertension, Lung nodule, Migraine headache, Tobacco use, and Vitamin D deficiency.   Surgical History:   Past Surgical History:  Procedure Laterality Date   ABDOMINAL HYSTERECTOMY     ARM NEUROPLASTY Left    c section x 4     CESAREAN SECTION     FOOT SURGERY       Social History:   reports that she has been  smoking cigarettes. She has a 9.50 pack-year smoking history. She has never used smokeless tobacco. She reports current drug use. Drug: Marijuana. She reports that she does not drink alcohol.   Family History:  Her family history includes Breast cancer (age of onset: 66) in her mother; CAD in her mother; Hyperlipidemia in her mother; Hypertension in her father and mother.   Allergies Allergies  Allergen Reactions   Ciprofloxacin Other (See Comments)    Generalized muscle and joint pain.   Morphine And Related Itching and Nausea And Vomiting   Penicillins Hives    Has patient had a PCN reaction causing immediate rash, facial/tongue/throat swelling, SOB or lightheadedness with hypotension: No Has patient had a PCN reaction causing severe rash involving mucus membranes or skin necrosis: No Has patient had a PCN reaction that required hospitalization No Has patient had a PCN reaction occurring within the last 10 years: No If  all of the above answers are "NO", then may proceed with Cephalosporin use.      Home Medications  Prior to Admission medications   Medication Sig Start Date End Date Taking? Authorizing Provider  amLODipine (NORVASC) 5 MG tablet Take 1 tablet (5 mg total) by mouth daily. 09/07/20   Isla Pence, MD  cyclobenzaprine (FLEXERIL) 5 MG tablet Take 5 mg by mouth 3 (three) times daily as needed for muscle spasms.    [provider]  gabapentin (NEURONTIN) 600 MG tablet Take 600 mg by mouth 2 (two) times daily. 09/09/20   [provider]  ibuprofen (ADVIL) 600 MG tablet Take 1 tablet (600 mg total) by mouth every 6 (six) hours as needed. Patient not taking: No sig reported 09/07/20   Isla Pence, MD     Critical care time: 21 minutes      [employer] MSN, APRN, AGACNP-BC  Pulmonary & Critical Care  02/28/2021 , 8:29 PM  Please see [email or web address] for pager details  If no response, please call [phone] After hours, please call Elink at [phone]

## 2021-02-28 NOTE — Consult Note (Signed)
Cardiology Consultation:   Patient ID: Rachel Fischer MRN: [id number]; DOB: Sep 25, 1970  Admit date: 02/28/2021 Date of Consult: 02/28/2021  Primary Care Provider: Trey Sailors, PA [facility] Cardiologist: None  [facility] Electrophysiologist:  None   Patient Profile:   Rachel Fischer is a 51 y.o. female with HTN, tobacco abuse, HLD, MDD, and migraines who is being seen today for the evaluation of HTN and chest pain at the request of Genevive Bi.   History of Present Illness:   Per chart review Rachel Fischer initially presented to the droppage emergency department with 3 days of left-sided chest discomfort that she reported and is left breast and a sharp pressure that was exacerbated by movement and associated with nausea, SOB, and decreased appetite.  On initial presentation she was HTN (187/103). ECG with q waves in V1-V2 but otherwise unremarkable, no ST elevation/depression or TWI.   Labs reviewed: CBC/CMP unremarkable (Hb 12.8, plt 324, sCr 0.7), hsT r/o (2->2).  hCG negative, covid negative, D dimer < 0.27.   VS at [facility] ED BP 179/101, P 72, RR 12, O2 100%/RA, T 98.4  She was given metoprolol tartrate IV 5 mg (1459), nifedipine XL 60 mg (1545), and SLNG 0.4 mg (1639). Her BP initially did not improve but did improve some after SLNG although Lopressor and nifedipine ha already been given prior to this. NG gtt was started at 1740. She was transferred to [facility] for HTN urgency. She was on nitro gtt at 25 mcg/min on arrival with BP 143/101, HR 75 and 6/10 severity CP. CTA C/A/P was negative for aortic dissection or aneurysm.   She last was seen in the emergency department (09/07/20) for back and hand pain with known carpal tunnel syndrome.  She has been out of her blood pressure meds for several months and at that time her BP was 204/108.  She was started back on her amlodipine and discharged.  She had previously been prescribed lisinopril-HCTZ 20-25 mg daily in addition to  amlodipine 5 mg daily. Prior to this she had been evaluated in urgent care (08/12/19) and sent to the ED for HTN urgency (BP 203/122) and L CP, HA, and intermittent SOB with elevated BP after being off BP meds for >2 months. She left AMA during that evaluation 2/2 wait times.  During my evaluation he was found he was resting comfortably in bed talking with her son. VS: P 73, BP 172/104 (123) on NG gtt at 15 mcg/min, O2 99%/RA, RR 12  Rachel Fischer reports that since Friday afternoon around 3 PM she has had near constant and fluctuating intensity left-sided upper chest discomfort with some radiation to her back.  She feels like there is a gas bubble up underneath her ribs that she cannot massage out.  There is no radiation to her arms or neck.  She has not had similar pain before.  She works as a Librarian, academic and got off work around 3 PM when she noticed her symptoms after laying down to rest.  Her chest/abdominal discomfort was most prominent when she laid on her left side and improved with laying completely supine.  She did notice some worsening symptoms with activity and that whenever she stayed still it would get somewhat better.  Initially her symptoms were excruciating 10/10 severity however improved to normal minor pain when lying supine and worsened to 10/10 when on her left side.  She initially thought this was reflux because it felt like a gas bubble but did  not improve with reflux medications.  The pain was persistent through Friday night and still persistent when she woke up Saturday morning.  It was ongoing Sunday and at that point she reported to the emergency department for further evaluation.  She has been on amlodipine 10 mg daily.  She was on lisinopril/HCTZ but was taken off of this as she could not tolerate frequent urination.  She has not missed any doses of her amlodipine recently.  She does still smoke but is drastically cut back on the amount.  She was smoking 1 pack/day since her 54s and  now only smokes on average 5 cigarettes/week.  She does smoke marijuana daily but denies any other drug use including cocaine.  She denies any PND, lower extremity swelling, weight gain, or orthopnea.  She has no family history of premature CAD or sudden cardiac death.  Past Medical History:  Diagnosis Date   Anemia    Colon polyps    Decreased thyroid stimulating hormone (TSH) level    Depression    has not been diagnosed   Hx of trichomoniasis    Hyperlipidemia    Hypertension    not currently taking meds due to financial reasons   Lung nodule    Migraine headache    migraines; gets them 4/month   Tobacco use    Vitamin D deficiency    Past Surgical History:  Procedure Laterality Date   ABDOMINAL HYSTERECTOMY     ARM NEUROPLASTY Left    c section x 4     CESAREAN SECTION     FOOT SURGERY      Home Medications:  Prior to Admission medications   Medication Sig Start Date End Date Taking? Authorizing Provider  amLODipine (NORVASC) 5 MG tablet Take 1 tablet (5 mg total) by mouth daily. 09/07/20   Jacalyn Lefevre, MD  cyclobenzaprine (FLEXERIL) 5 MG tablet Take 5 mg by mouth 3 (three) times daily as needed for muscle spasms.    [provider]  gabapentin (NEURONTIN) 600 MG tablet Take 600 mg by mouth 2 (two) times daily. 09/09/20   [provider]  ibuprofen (ADVIL) 600 MG tablet Take 1 tablet (600 mg total) by mouth every 6 (six) hours as needed. Patient not taking: No sig reported 09/07/20   Jacalyn Lefevre, MD   Inpatient Medications: Scheduled Meds:  [START ON 03/01/2021] amLODipine  5 mg Oral Daily   Chlorhexidine Gluconate Cloth  6 each Topical Daily   gabapentin  600 mg Oral BID   heparin injection (subcutaneous)  5,000 Units Subcutaneous Q8H   lidocaine  1 patch Transdermal Q24H   mouth rinse  15 mL Mouth Rinse BID   pantoprazole  40 mg Oral Daily   Continuous Infusions:  nitroGLYCERIN 25 mcg/min (02/28/21 2000)   PRN Meds: acetaminophen,  nitroGLYCERIN  Allergies:    Allergies  Allergen Reactions   Ciprofloxacin Other (See Comments)    Generalized muscle and joint pain.   Morphine And Related Itching and Nausea And Vomiting   Penicillins Hives    Has patient had a PCN reaction causing immediate rash, facial/tongue/throat swelling, SOB or lightheadedness with hypotension: No Has patient had a PCN reaction causing severe rash involving mucus membranes or skin necrosis: No Has patient had a PCN reaction that required hospitalization No Has patient had a PCN reaction occurring within the last 10 years: No If all of the above answers are "NO", then may proceed with Cephalosporin use.    Social History:  Social History   Socioeconomic History   Marital status: Single    Spouse name: Not on file   Number of children: 4   Years of education: Not on file   Highest education level: Not on file  Occupational History   Occupation: unemployed    Employer: UNEMPLOYED  Tobacco Use   Smoking status: Every Day    Packs/day: 0.50    Years: 19.00    Pack years: 9.50    Types: Cigarettes   Smokeless tobacco: Never  Vaping Use   Vaping Use: Some days  Substance and Sexual Activity   Alcohol use: No   Drug use: Yes    Types: Marijuana   Sexual activity: Yes    Birth control/protection: Surgical  Other Topics Concern   Not on file  Social History Narrative   Children: 3 boys and 1 girl.    Right Handed    Lives in a two story home    Social Determinants of Health   Financial Resource Strain: Not on file  Food Insecurity: Not on file  Transportation Needs: Not on file  Physical Activity: Not on file  Stress: Not on file  Social Connections: Not on file  Intimate Partner Violence: Not on file    Family History:    Family History  Problem Relation Age of Onset   Breast cancer Mother 33   Hypertension Mother    Hyperlipidemia Mother    CAD Mother    Hypertension Father     ROS:  Review of Systems: [y] =  yes, [ ]  = no      General: Weight gain [ ] ; Weight loss [ ] ; Anorexia [ ] ; Fatigue [ ] ; Fever [ ] ; Chills [ ] ; Weakness [ ]    Cardiac: Chest pain/pressure [y]; Resting SOB [ ] ; Exertional SOB [ ] ; Orthopnea [ ] ; Pedal Edema [ ] ; Palpitations [ ] ; Syncope [ ] ; Presyncope [ ] ; Paroxysmal nocturnal dyspnea [ ]    Pulmonary: Cough [ ] ; Wheezing [ ] ; Hemoptysis [ ] ; Sputum [ ] ; Snoring [ ]   GI: Vomiting [ ] ; Dysphagia [ ] ; Melena [ ] ; Hematochezia [ ] ; Heartburn [ ] ; Abdominal pain [ ] ; Constipation [ ] ; Diarrhea [ ] ; BRBPR [ ]    GU: Hematuria [ ] ; Dysuria [ ] ; Nocturia [ ]  Vascular: Pain in legs with walking [ ] ; Pain in feet with lying flat [ ] ; Non-healing sores [ ] ; Stroke [ ] ; TIA [ ] ; Slurred speech [ ] ;   Neuro: Headaches [ ] ; Vertigo [ ] ; Seizures [ ] ; Paresthesias [ ] ;Blurred vision [ ] ; Diplopia [ ] ; Vision changes [ ]    Ortho/Skin: Arthritis [ ] ; Joint pain [ ] ; Muscle pain [ ] ; Joint swelling [ ] ; Back Pain [ ] ; Rash [ ]    Psych: Depression [ ] ; Anxiety [ ]    Heme: Bleeding problems [ ] ; Clotting disorders [ ] ; Anemia [ ]    Endocrine: Diabetes [ ] ; Thyroid dysfunction [ ]    Physical Exam/Data:   Vitals:   02/28/21 1930 02/28/21 1945 02/28/21 2025 02/28/21 2030  BP: (!) 169/93 (!) 154/90  (!) 168/93  Pulse: 70 67    Resp: 14 18  13   Temp: 98.3 F (36.8 C)   (!) 97.5 F (36.4 C)  TempSrc: Oral   Oral  SpO2: 100% 100%  100%  Weight:   64.1 kg   Height:   5' (1.524 m)     Intake/Output Summary (Last 24 hours) at 02/28/2021 2112 Last data  filed at 02/28/2021 2000 Gross per 24 hour  Intake 5.75 ml  Output --  Net 5.75 ml   Last 3 Weights 02/28/2021 02/28/2021 01/04/2021  Weight (lbs) 141 lb 5 oz 146 lb 146 lb 6.4 oz  Weight (kg) 64.1 kg 66.225 kg 66.407 kg     Body mass index is 27.6 kg/m.  General:  Well nourished, well developed, in no acute distress HEENT: normal Lymph: no adenopathy Neck: no JVD Endocrine:  No thryomegaly Vascular: No carotid bruits; FA pulses 2+  bilaterally without bruits  Cardiac:  normal S1, S2; RRR; no murmur  Lungs:  clear to auscultation bilaterally, no wheezing, rhonchi or rales  Abd: soft, nontender, no hepatomegaly  Ext: no edema Musculoskeletal:  No deformities, BUE and BLE strength normal and equal Skin: warm and dry  Neuro:  CNs 2-12 intact, no focal abnormalities noted Psych:  Normal affect   EKG:  The EKG was personally reviewed and demonstrates: (02/28/21, 11:49:59) NSR 87, PR 169, QRS 82, Qtc 450, small septal Q waves V1-V2, no other ischemic changes. These changes improved with BP control on repeat ECG  Telemetry:  Telemetry was personally reviewed and demonstrates: NSR   Relevant CV Studies:  CTA C/A/P  Result date: 02/28/21 1. Negative for aortic dissection or aneurysm.  Clear lung fields. 2. Minimal aortic atherosclerosis. 3. No CT evidence for acute intra-abdominal or pelvic abnormality.   Laboratory Data:  High Sensitivity Troponin:   Recent Labs  Lab 02/28/21 1239 02/28/21 1433  TROPONINIHS 2 2     Chemistry Recent Labs  Lab 02/28/21 1239  NA 139  K 3.8  CL 104  CO2 25  GLUCOSE 98  BUN 9  CREATININE 0.73  CALCIUM 9.1  GFRNONAA >60  ANIONGAP 10    Recent Labs  Lab 02/28/21 1239  PROT 7.1  ALBUMIN 4.5  AST 15  ALT 10  ALKPHOS 71  BILITOT 0.4   Hematology Recent Labs  Lab 02/28/21 1239  WBC 4.8  RBC 4.61  HGB 12.8  HCT 39.3  MCV 85.2  MCH 27.8  MCHC 32.6  RDW 13.2  PLT 324   BNPNo results for input(s): BNP, PROBNP in the last 168 hours.  DDimer  Recent Labs  Lab 02/28/21 1239  DDIMER <0.27   Radiology/Studies:  DG Chest 2 View  Result Date: 02/28/2021 CLINICAL DATA:  3 day history of chest pain. EXAM: CHEST - 2 VIEW COMPARISON:  04/10/2017 FINDINGS: The lungs are clear without focal pneumonia, edema, pneumothorax or pleural effusion. The cardiopericardial silhouette is within normal limits for size. The visualized bony structures of the thorax show no acute  abnormality. Telemetry leads overlie the chest. IMPRESSION: No active cardiopulmonary disease. Electronically Signed   By: Misty Stanley M.D.   On: 02/28/2021 13:16   CT Angio Chest/Abd/Pel for Dissection W and/or Wo Contrast  Result Date: 02/28/2021 CLINICAL DATA:  Left-sided chest pain EXAM: CT ANGIOGRAPHY CHEST, ABDOMEN AND PELVIS TECHNIQUE: Non-contrast CT of the chest was initially obtained. Multidetector CT imaging through the chest, abdomen and pelvis was performed using the standard protocol during bolus administration of intravenous contrast. Multiplanar reconstructed images and MIPs were obtained and reviewed to evaluate the vascular anatomy. CONTRAST:  158mL OMNIPAQUE IOHEXOL 350 MG/ML SOLN COMPARISON:  Chest x-ray 02/28/2021, CT 02/28/2016, CT chest 12/31/2013 FINDINGS: CTA CHEST FINDINGS Cardiovascular: Non contrasted images of the chest demonstrate no acute intramural hematoma. Aorta is nonaneurysmal. No dissection seen. Normal cardiac size. No pericardial effusion Mediastinum/Nodes: Midline trachea. No thyroid  mass. No suspicious lymph nodes. Esophagus within normal limits. Lungs/Pleura: No acute consolidation, pleural effusion, or pneumothorax. Stable punctate nodule in the peripheral left lower lobe, series 7, image 75, considered benign given lack of interval change. Musculoskeletal: No chest wall abnormality. No acute or significant osseous findings. Review of the MIP images confirms the above findings. CTA ABDOMEN AND PELVIS FINDINGS VASCULAR Aorta: Normal caliber aorta without aneurysm, dissection, vasculitis or significant stenosis. Minimal atherosclerosis. Celiac: Patent and without stenosis. Focally ectatic proximal celiac artery up to 9 mm. Distal branches are patent SMA: Patent without evidence of aneurysm, dissection, vasculitis or significant stenosis. Renals: 2 right and 2 left renal arteries are patent and without stenosis occlusion or aneurysm. IMA: Patent without evidence of  aneurysm, dissection, vasculitis or significant stenosis. Inflow: Patent without evidence of aneurysm, dissection, vasculitis or significant stenosis. Review of the MIP images confirms the above findings. NON-VASCULAR Hepatobiliary: No focal liver abnormality is seen. No gallstones, gallbladder wall thickening, or biliary dilatation. Pancreas: Unremarkable. No pancreatic ductal dilatation or surrounding inflammatory changes. Spleen: Normal in size without focal abnormality. Adrenals/Urinary Tract: Adrenal glands are unremarkable. Kidneys are normal, without renal calculi, focal lesion, or hydronephrosis. Bladder is unremarkable. Stomach/Bowel: Stomach is within normal limits. Appendix appears normal. No evidence of bowel wall thickening, distention, or inflammatory changes. Lymphatic: No suspicious lymph nodes Reproductive: Status post hysterectomy.  No suspicious adnexal mass. Other: Negative for pelvic effusion or free air Musculoskeletal: No acute or significant osseous findings. Review of the MIP images confirms the above findings. IMPRESSION: 1. Negative for aortic dissection or aneurysm.  Clear lung fields. 2. Minimal aortic atherosclerosis. 3. No CT evidence for acute intra-abdominal or pelvic abnormality. Electronically Signed   By: Donavan Foil M.D.   On: 02/28/2021 19:29    HEART score (2)   Assessment and Plan:   HTN urgency  Chest pain  Ms. Raudenbush presents with recurrent uncontrolled HTN despite being on amlodipine 10 mg daily.  She has been on lisinopril/HCTZ in the past.  Her pain did improve with improvement in her blood pressure although she is still in the 170s during my evaluation and only has minimal pain.  In the past she has had recurrent episodes of chest discomfort which she thinks were less severe and different in character and quality to this presentation in relationship to hypertension.  Risk factors are HTN and tobacco use.  She ruled out on high-sensitivity troponin.  ECG was  similar to prior (q wave in V2 not present on repeat).  Her symptoms do not seem typical for cardiac chest pain was able constant nature, positional, and reproducible with focal palpation.  With reassuring ECG and negative troponin she does not warrant further inpatient cardiac evaluation at this time.  She should be started back on either ACE inhibitor or ARB in combination with amlodipine for better blood pressure control.  She likely will not diuretic based off with experience with HCTZ.  She had no issue with lisinopril by itself. Currently still on low dose nitro gtt.   For questions or updates, please contact [place] Please consult [email or web address] for contact info under   Signed, Dion Body, MD  02/28/2021 9:12 PM

## 2021-02-28 NOTE — ED Notes (Signed)
Pt IV pump was beeping when I went in to check on her, IV positional.  Advised pt to call me when IV pump beeps and offered to change site.

## 2021-02-28 NOTE — Progress Notes (Signed)
eLink Physician-Brief Progress Note Patient Name: Rachel Fischer DOB: April 13, 1970 MRN: [id number]   Date of Service  02/28/2021  HPI/Events of Note  Brief HPI: 54 female transferred from OSH for Hypertensive urgency on NTG. CTA neg. All labs, including trop, d dimer normal.  Notes, labs reviewed.  Trop x 2 neg.  Data: Reviewed  Camera: On NTG gtt. 162/93, on room air. Normal mentation. HR 63  VTE: low risk for now. Early ambulation once off of gtt.  CBG goals < 180  eICU Interventions  Do not drop MAP > 20 % in first 24 hrs.      Intervention Category Major Interventions: Hypertension - evaluation and management Evaluation Type: New Patient Evaluation  Elmer Sow 02/28/2021, 8:32 PM

## 2021-02-28 NOTE — ED Provider Notes (Signed)
[facility] EMERGENCY DEPT Provider Note   CSN: [id number] Arrival date & time: 02/28/21  1142     History  Chief Complaint  Patient presents with   Chest Pain    Rachel Fischer is a 51 y.o. female with medical history of hypertension.  Patient presents to the ED today due to 3 days of left-sided chest pain that she describes as "behind the breast".  Patient states that the pain is constant and she describes as a sharp pressure.  Patient is attempted to alleviate the pain utilizing Gas-X and Tums with no relief.  Patient states that the pain is worsened by turning onto her left side, climbing stairs and with deep inspiration.  The patient endorses nausea, changes to her sleep habits and changes to her appetite, shortness of breath.  Patient denies any vomiting, diarrhea, fevers, abdominal pain, lightheadedness, headaches, dizziness, weakness.   Chest Pain Associated symptoms: nausea   Associated symptoms: no abdominal pain, no dizziness, no fever, no headache, no vomiting and no weakness       Home Medications Prior to Admission medications   Medication Sig Start Date End Date Taking? Authorizing Provider  amLODipine (NORVASC) 5 MG tablet Take 1 tablet (5 mg total) by mouth daily. 09/07/20   Jacalyn Lefevre, MD  cyclobenzaprine (FLEXERIL) 5 MG tablet Take 5 mg by mouth 3 (three) times daily as needed for muscle spasms.    [provider]  gabapentin (NEURONTIN) 600 MG tablet Take 600 mg by mouth 2 (two) times daily. 09/09/20   [provider]  ibuprofen (ADVIL) 600 MG tablet Take 1 tablet (600 mg total) by mouth every 6 (six) hours as needed. Patient not taking: No sig reported 09/07/20   Jacalyn Lefevre, MD      Allergies    Ciprofloxacin, Morphine and related, and Penicillins    Review of Systems   Review of Systems  Constitutional:  Positive for appetite change. Negative for chills and fever.  HENT:  Negative for sore throat.   Cardiovascular:   Positive for chest pain.  Gastrointestinal:  Positive for nausea. Negative for abdominal pain, diarrhea and vomiting.  Neurological:  Negative for dizziness, weakness, light-headedness and headaches.  All other systems reviewed and are negative.  Physical Exam Updated Vital Signs BP (!) 179/101    Pulse 72    Temp 98.4 F (36.9 C)    Resp 12    Ht 5' (1.524 m)    Wt 66.2 kg    SpO2 100%    BMI 28.51 kg/m  Physical Exam Vitals and nursing note reviewed.  Constitutional:      General: She is not in acute distress.    Appearance: She is not ill-appearing or toxic-appearing.  HENT:     Head: Normocephalic and atraumatic.  Eyes:     Pupils: Pupils are equal, round, and reactive to light.  Neck:     Vascular: No JVD.  Cardiovascular:     Rate and Rhythm: Normal rate and regular rhythm.     Pulses:          Radial pulses are 2+ on the right side and 2+ on the left side.       Dorsalis pedis pulses are 2+ on the right side and 2+ on the left side.  Pulmonary:     Effort: Pulmonary effort is normal.     Breath sounds: Normal breath sounds.  Abdominal:     Palpations: Abdomen is soft.     Tenderness:  There is abdominal tenderness.     Comments: Tenderness to the left upper quadrant.  Patient states pain radiates up into her left side of her chest underneath her left breast.  Musculoskeletal:     Cervical back: Normal range of motion.  Skin:    General: Skin is warm and dry.     Capillary Refill: Capillary refill takes less than 2 seconds.  Neurological:     Mental Status: She is alert.    ED Results / Procedures / Treatments   Labs (all labs ordered are listed, but only abnormal results are displayed) Labs Reviewed  RESP PANEL BY RT-PCR (FLU A&B, COVID) ARPGX2  CBC  COMPREHENSIVE METABOLIC PANEL  D-DIMER, QUANTITATIVE  HCG, SERUM, QUALITATIVE  TROPONIN I (HIGH SENSITIVITY)  TROPONIN I (HIGH SENSITIVITY)    EKG None  Radiology DG Chest 2 View  Result Date:  02/28/2021 CLINICAL DATA:  3 day history of chest pain. EXAM: CHEST - 2 VIEW COMPARISON:  04/10/2017 FINDINGS: The lungs are clear without focal pneumonia, edema, pneumothorax or pleural effusion. The cardiopericardial silhouette is within normal limits for size. The visualized bony structures of the thorax show no acute abnormality. Telemetry leads overlie the chest. IMPRESSION: No active cardiopulmonary disease. Electronically Signed   By: Kennith CenterEric  Mansell M.D.   On: 02/28/2021 13:16    Procedures .Critical Care Performed by: Al DecantGroce, Muskaan Smet F, PA-C Authorized by: Al DecantGroce, Devarion Mcclanahan F, PA-C   Critical care provider statement:    Critical care time (minutes):  180   Critical care time was exclusive of:  Separately billable procedures and treating other patients   Critical care was necessary to treat or prevent imminent or life-threatening deterioration of the following conditions:  Circulatory failure   Critical care was time spent personally by me on the following activities:  Blood draw for specimens, development of treatment plan with patient or surrogate, discussions with consultants, discussions with primary provider, evaluation of patient's response to treatment, examination of patient, ordering and performing treatments and interventions, ordering and review of laboratory studies, ordering and review of radiographic studies, pulse oximetry, re-evaluation of patient's condition, review of old charts, obtaining history from patient or surrogate and interpretation of cardiac output measurements   I assumed direction of critical care for this patient from another provider in my specialty: yes     Care discussed with: admitting provider and accepting provider at another facility      Medications Ordered in ED Medications  acetaminophen (TYLENOL) tablet 650 mg (650 mg Oral Given 02/28/21 1545)  lidocaine (LIDODERM) 5 % 1 patch (1 patch Transdermal Patch Applied 02/28/21 1547)  nitroGLYCERIN  (NITROSTAT) SL tablet 0.4 mg (0.4 mg Sublingual Given 02/28/21 1639)  nitroGLYCERIN 50 mg in dextrose 5 % 250 mL (0.2 mg/mL) infusion (10 mcg/min Intravenous New Bag/Given 02/28/21 1755)  metoprolol tartrate (LOPRESSOR) injection 5 mg (5 mg Intravenous Given 02/28/21 1459)  ibuprofen (ADVIL) tablet 800 mg (800 mg Oral Given 02/28/21 1545)  NIFEdipine (PROCARDIA-XL/NIFEDICAL-XL) 24 hr tablet 60 mg (60 mg Oral Given 02/28/21 1545)    ED Course/ Medical Decision Making/ A&P                           Medical Decision Making  51 year old female with history of hypertension presents due to 3 days of chest pain.  On examination, patient is afebrile, nonhypoxic, nontachycardic.  Patient does not have any lower extremity swelling.  Patient states she believes that at first this  was indigestion, however she attempted to relieve the symptoms utilizing Gas-X and Tums with no relief.  Patient states that this is also worse with exertion.  Differential diagnosis at this time includes: ACS, unstable angina, hypertensive urgency, PE, abdominal gas pains  Patient work-up includes troponin, D-dimer, CBC, CMP, chest x-ray, EKG twelve-lead, CT angio chest abdomen pelvis for dissection  Patient troponin result is unremarkable, to Patient D-dimer not elevated, less than 0.27 Patient CMP unremarkable Patient CBC unremarkable Patient EKG twelve-lead unremarkable Patient chest x-ray unremarkable CT angio chest for dissection is negative.  This patient's work-up here in the ED has been largely unremarkable.  The patient continues to be hypertensive though with left-sided chest pain.  Initially we attempted to control this patient's blood pressure utilizing 5 mg of Lopressor which caused the patient's blood pressure to increase into the 180s systolic.  We then attempted to control this patient's blood pressure utilizing 60 mg of nifedipine which did not decrease patient BP.  Finally we give the patient nitroglycerin 0.4 mg  sublingually which alleviated the patient's left-sided chest pain however her blood pressure remained elevated.  I consulted with cardiology, Dr. Tenny Craw, at this time who stated that this patient was appropriate to bring in for observation.  I started this patient on a nitro drip.   I attempted to admit this patient to the hospitalist service, Dr. Roda Shutters, but due to patient hypertension Dr. Roda Shutters requested I attempted to admit this patient through critical care service.  I discussed this patient with Dr. Isaiah Serge of critical care who agreed to admit this patient to his service.  Dr. Isaiah Serge requested that I order a CT angiogram assessing for any possible dissection which I have done at this time. The patient is stable at time of admission.   Final Clinical Impression(s) / ED Diagnoses Final diagnoses:  Hypertensive urgency    Rx / DC Orders ED Discharge Orders     None         Al Decant, PA-C 02/28/21 1937    Franne Forts, DO 03/03/21 1111

## 2021-03-01 ENCOUNTER — Inpatient Hospital Stay (HOSPITAL_COMMUNITY): Payer: 59

## 2021-03-01 DIAGNOSIS — R079 Chest pain, unspecified: Secondary | ICD-10-CM

## 2021-03-01 DIAGNOSIS — F1210 Cannabis abuse, uncomplicated: Secondary | ICD-10-CM

## 2021-03-01 DIAGNOSIS — I160 Hypertensive urgency: Secondary | ICD-10-CM | POA: Diagnosis not present

## 2021-03-01 LAB — CBC
HCT: 42.3 % (ref 36.0–46.0)
Hemoglobin: 13.8 g/dL (ref 12.0–15.0)
MCH: 28 pg (ref 26.0–34.0)
MCHC: 32.6 g/dL (ref 30.0–36.0)
MCV: 86 fL (ref 80.0–100.0)
Platelets: 337 10*3/uL (ref 150–400)
RBC: 4.92 MIL/uL (ref 3.87–5.11)
RDW: 13.1 % (ref 11.5–15.5)
WBC: 5.5 10*3/uL (ref 4.0–10.5)
nRBC: 0 % (ref 0.0–0.2)

## 2021-03-01 LAB — RAPID URINE DRUG SCREEN, HOSP PERFORMED
Amphetamines: NOT DETECTED
Barbiturates: NOT DETECTED
Benzodiazepines: NOT DETECTED
Cocaine: NOT DETECTED
Opiates: NOT DETECTED
Tetrahydrocannabinol: POSITIVE — AB

## 2021-03-01 LAB — BASIC METABOLIC PANEL
Anion gap: 8 (ref 5–15)
BUN: 5 mg/dL — ABNORMAL LOW (ref 6–20)
CO2: 29 mmol/L (ref 22–32)
Calcium: 9.2 mg/dL (ref 8.9–10.3)
Chloride: 102 mmol/L (ref 98–111)
Creatinine, Ser: 0.75 mg/dL (ref 0.44–1.00)
GFR, Estimated: >60 mL/min (ref >60)
Glucose, Bld: 100 mg/dL — ABNORMAL HIGH (ref 70–99)
Potassium: 3.4 mmol/L — ABNORMAL LOW (ref 3.5–5.1)
Sodium: 139 mmol/L (ref 135–145)

## 2021-03-01 LAB — SEDIMENTATION RATE: Sed Rate: 4 mm/hr (ref 0–22)

## 2021-03-01 LAB — TROPONIN I (HIGH SENSITIVITY): Troponin I (High Sensitivity): 4 ng/L (ref <18)

## 2021-03-01 LAB — ECHOCARDIOGRAM COMPLETE
Area-P 1/2: 3.81 cm2
Height: 60 in
S' Lateral: 2.7 cm
Weight: 2261.04 oz

## 2021-03-01 LAB — C-REACTIVE PROTEIN: CRP: 0.8 mg/dL (ref <1.0)

## 2021-03-01 LAB — TSH: TSH: 0.823 u[IU]/mL (ref 0.350–4.500)

## 2021-03-01 LAB — MAGNESIUM: Magnesium: 2.4 mg/dL (ref 1.7–2.4)

## 2021-03-01 MED ORDER — ALUM & MAG HYDROXIDE-SIMETH 200-200-20 MG/5ML PO SUSP: 30.0000 mL | Freq: Four times a day (QID) | ORAL | Status: DC | PRN

## 2021-03-01 MED ORDER — LOSARTAN POTASSIUM 50 MG PO TABS
50.0000 mg | ORAL_TABLET | Freq: Every day | ORAL | Status: DC
  Administered 2021-03-01 – 2021-03-02 (×2): 50 mg via ORAL
  Filled 2021-03-01 (×2): qty 1

## 2021-03-01 MED ORDER — POTASSIUM CHLORIDE CRYS ER 20 MEQ PO TBCR
30.0000 meq | EXTENDED_RELEASE_TABLET | Freq: Once | ORAL | Status: AC
  Administered 2021-03-01: 30 meq via ORAL
  Filled 2021-03-01: qty 1

## 2021-03-01 NOTE — Progress Notes (Signed)
Progress Note  Patient Name: Rachel Fischer Date of Encounter: 03/01/2021  [facility] Cardiologist: None    Subjective   No acute events overnight.  Patient will mild chest discomfort which has been constant.  Nitro weaned off and started on losartan.  Inpatient Medications    Scheduled Meds:  amLODipine  5 mg Oral Daily   Chlorhexidine Gluconate Cloth  6 each Topical Daily   gabapentin  600 mg Oral BID   heparin injection (subcutaneous)  5,000 Units Subcutaneous Q8H   lidocaine  1 patch Transdermal Q24H   losartan  50 mg Oral Daily   mouth rinse  15 mL Mouth Rinse BID   pantoprazole  40 mg Oral Daily   Continuous Infusions:  PRN Meds: acetaminophen, nitroGLYCERIN   Vital Signs    Vitals:   03/01/21 0500 03/01/21 0600 03/01/21 0742 03/01/21 0800  BP: (!) 146/84 124/89  (!) 147/81  Pulse: 77 64  78  Resp: 13     Temp:   97.7 F (36.5 C)   TempSrc:   Oral   SpO2: 100% 99%  100%  Weight:      Height:        Intake/Output Summary (Last 24 hours) at 03/01/2021 0942 Last data filed at 03/01/2021 0737 Gross per 24 hour  Intake 23.89 ml  Output 600 ml  Net -576.11 ml   Last 3 Weights 02/28/2021 02/28/2021 01/04/2021  Weight (lbs) 141 lb 5 oz 146 lb 146 lb 6.4 oz  Weight (kg) 64.1 kg 66.225 kg 66.407 kg      Telemetry    NSR - Personally Reviewed  ECG    NSR - Personally Reviewed  Physical Exam    GEN: No acute distress.   Neck: No JVD Cardiac: RRR, no murmurs, rubs, or gallops.  Respiratory: Clear to auscultation bilaterally. GI: Soft, nontender, non-distended  MS: No edema; No deformity. Neuro:  Nonfocal  Psych: Normal affect   Labs    High Sensitivity Troponin:   Recent Labs  Lab 02/28/21 1239 02/28/21 1433 03/01/21 0655  TROPONINIHS 2 2 4      Chemistry Recent Labs  Lab 02/28/21 1239 03/01/21 0655  NA 139 139  K 3.8 3.4*  CL 104 102  CO2 25 29  GLUCOSE 98 100*  BUN 9 5*  CREATININE 0.73 0.75  CALCIUM 9.1 9.2  MG  --  2.4  PROT  7.1  --   ALBUMIN 4.5  --   AST 15  --   ALT 10  --   ALKPHOS 71  --   BILITOT 0.4  --   GFRNONAA >60 >60  ANIONGAP 10 8    Lipids No results for input(s): CHOL, TRIG, HDL, LABVLDL, LDLCALC, CHOLHDL in the last 168 hours.  Hematology Recent Labs  Lab 02/28/21 1239 03/01/21 0655  WBC 4.8 5.5  RBC 4.61 4.92  HGB 12.8 13.8  HCT 39.3 42.3  MCV 85.2 86.0  MCH 27.8 28.0  MCHC 32.6 32.6  RDW 13.2 13.1  PLT 324 337   Thyroid  Recent Labs  Lab 03/01/21 0655  TSH 0.823    BNPNo results for input(s): BNP, PROBNP in the last 168 hours.  DDimer  Recent Labs  Lab 02/28/21 1239  DDIMER <0.27     Radiology    DG Chest 2 View  Result Date: 02/28/2021 CLINICAL DATA:  3 day history of chest pain. EXAM: CHEST - 2 VIEW COMPARISON:  04/10/2017 FINDINGS: The lungs are clear without focal pneumonia,  edema, pneumothorax or pleural effusion. The cardiopericardial silhouette is within normal limits for size. The visualized bony structures of the thorax show no acute abnormality. Telemetry leads overlie the chest. IMPRESSION: No active cardiopulmonary disease. Electronically Signed   By: Kennith CenterEric  Mansell M.D.   On: 02/28/2021 13:16   CT Angio Chest/Abd/Pel for Dissection W and/or Wo Contrast  Result Date: 02/28/2021 CLINICAL DATA:  Left-sided chest pain EXAM: CT ANGIOGRAPHY CHEST, ABDOMEN AND PELVIS TECHNIQUE: Non-contrast CT of the chest was initially obtained. Multidetector CT imaging through the chest, abdomen and pelvis was performed using the standard protocol during bolus administration of intravenous contrast. Multiplanar reconstructed images and MIPs were obtained and reviewed to evaluate the vascular anatomy. CONTRAST:  100mL OMNIPAQUE IOHEXOL 350 MG/ML SOLN COMPARISON:  Chest x-ray 02/28/2021, CT 02/28/2016, CT chest 12/31/2013 FINDINGS: CTA CHEST FINDINGS Cardiovascular: Non contrasted images of the chest demonstrate no acute intramural hematoma. Aorta is nonaneurysmal. No dissection seen.  Normal cardiac size. No pericardial effusion Mediastinum/Nodes: Midline trachea. No thyroid mass. No suspicious lymph nodes. Esophagus within normal limits. Lungs/Pleura: No acute consolidation, pleural effusion, or pneumothorax. Stable punctate nodule in the peripheral left lower lobe, series 7, image 75, considered benign given lack of interval change. Musculoskeletal: No chest wall abnormality. No acute or significant osseous findings. Review of the MIP images confirms the above findings. CTA ABDOMEN AND PELVIS FINDINGS VASCULAR Aorta: Normal caliber aorta without aneurysm, dissection, vasculitis or significant stenosis. Minimal atherosclerosis. Celiac: Patent and without stenosis. Focally ectatic proximal celiac artery up to 9 mm. Distal branches are patent SMA: Patent without evidence of aneurysm, dissection, vasculitis or significant stenosis. Renals: 2 right and 2 left renal arteries are patent and without stenosis occlusion or aneurysm. IMA: Patent without evidence of aneurysm, dissection, vasculitis or significant stenosis. Inflow: Patent without evidence of aneurysm, dissection, vasculitis or significant stenosis. Review of the MIP images confirms the above findings. NON-VASCULAR Hepatobiliary: No focal liver abnormality is seen. No gallstones, gallbladder wall thickening, or biliary dilatation. Pancreas: Unremarkable. No pancreatic ductal dilatation or surrounding inflammatory changes. Spleen: Normal in size without focal abnormality. Adrenals/Urinary Tract: Adrenal glands are unremarkable. Kidneys are normal, without renal calculi, focal lesion, or hydronephrosis. Bladder is unremarkable. Stomach/Bowel: Stomach is within normal limits. Appendix appears normal. No evidence of bowel wall thickening, distention, or inflammatory changes. Lymphatic: No suspicious lymph nodes Reproductive: Status post hysterectomy.  No suspicious adnexal mass. Other: Negative for pelvic effusion or free air Musculoskeletal: No  acute or significant osseous findings. Review of the MIP images confirms the above findings. IMPRESSION: 1. Negative for aortic dissection or aneurysm.  Clear lung fields. 2. Minimal aortic atherosclerosis. 3. No CT evidence for acute intra-abdominal or pelvic abnormality. Electronically Signed   By: Jasmine PangKim  Fujinaga M.D.   On: 02/28/2021 19:29    Cardiac Studies   Echo pending  Patient Profile     51 y.o. female AAF with HTN, HL here with hypertensive emergency  Assessment & Plan    HTN:  Agree with losartan.  Will ollow up echocardiogram.  Discussed with Hospital Medicine, will s/o call with questions.  Precordial pain:  Chest CT in ED without coronary calcification and Trops negative x 2.  No further evaluation required at this time.     [facility] will sign off.   Medication Recommendations:  HTN med titration Other recommendations (labs, testing, etc):  None Follow up as an outpatient:  No Cardiology follow up required.  For questions or updates, please contact [facility] Please consult [email or web address]  for contact info under        Signed, Orbie Pyo, MD  03/01/2021, 9:42 AM

## 2021-03-01 NOTE — Progress Notes (Signed)
Echocardiogram 2D Echocardiogram has been performed.  Warren Lacy Marin Milley RDCS 03/01/2021, 9:04 AM

## 2021-03-01 NOTE — Care Management (Signed)
°  Transition of Care (TOC) Screening Note   Patient Details  Name: Rachel Fischer Date of Birth: [birth year]   Transition of Care [facility]) CM/SW Contact:    Gala Lewandowsky, RN Phone Number: 03/01/2021, 10:21 AM    Transition of Care Department [facility]) has reviewed patient and no TOC needs have been identified at this time. We will continue to monitor patient advancement through interdisciplinary progression rounds. If new patient transition needs arise, please place a TOC consult.

## 2021-03-01 NOTE — Progress Notes (Addendum)
NAME:  Rachel Fischer, MRN:  [id number], DOB:  [birth year], LOS: 1 ADMISSION DATE:  02/28/2021, CONSULTATION DATE:  02/28/2021 REFERRING MD:  Lorinda Creed, PA, CHIEF COMPLAINT:  HTN/ CP   History of Present Illness:   51 year old female with prior history significant for HTN on home norvasc who presented to [facility] ER with 3 day complaint of left sided chest pain.  Pain described behind left breast, sharp pressure, exacerbated with movement and exertion with associated nausea, shortness of breath, and poor appetite.  Denied any fever, chills, vomiting, dizziness, headaches, abd pain, or weakness.  States still taking taking home norvasc  In ER, patient afebrile, NSR, initial BP 187/103, and normal O2 saturations. Workup mostly benign with normal troponin hs x 2, negative d-dimer, and unremarkable CBC and CMET.  EKG NSR rate 87.  CXR negative.   She was treated with lopressor 93m which actually caused worsening hypertension.  Then was given nifedipine which had no effect on BP.  She continued to have left sided chest pain and therefore given nitro SL which alleviated chest pain but ongoing hypertensive.  ER consulted with Cardiology with recommendations for observation.  She was started on a nitro drip.  Pending CTA chest to rule out dissection.  [facility]   Pertinent  Medical History  Tobacco abuse, anemia, HTN, HLD, migraines, depression, R carpel tunnel/ neuropathy  Significant Hospital Events: Including procedures, antibiotic start and stop dates in addition to other pertinent events   1/8 Admitted with chest pain/ HTN urgency on nitro gtt  Interim History / Subjective:  She is still having some chest pain. Off NTG since last evening.   Objective   Blood pressure 124/89, pulse 64, temperature 97.7 F (36.5 C), temperature source Oral, resp. rate 13, height 5' (1.524 m), weight 64.1 kg, SpO2 99 %.        Intake/Output Summary (Last 24 hours) at  03/01/2021 0807 Last data filed at 03/01/2021 0737 Gross per 24 hour  Intake 23.89 ml  Output 600 ml  Net -576.11 ml   Filed Weights   02/28/21 1147 02/28/21 2025  Weight: 66.2 kg 64.1 kg    Examination: General: middle aged woman lying in bed in NAD HEENT: [place]/AT, eyes anicteric Neuro: awake, alert, answering questions appropriately, moving extremities CV: s1S2, RRR PULM:  breathing comfortably on RA, CTAB GI: soft, NT Extremities: no edema or cyanosis Skin:  warm, dry, no rashes  CTA personally reviewed> no dissection or pericardial effusion  K+ 3.4 BUN 5 Cr 0.75 WBC 5.5 H/H 13.8/42.3 Trop 2,2,4  Echo LVEF 60-65%, normla LV function. G1DD. Normal RV size and function. Trivial MR, otherwise normal valves. IVC normal size and variability. Trivial pericardial effusion.  Resolved Hospital Problem list     Assessment & Plan:  Hypertensive urgency - now controlled and off infusions -d/c NTG -con't amlodipine -start losartan 565mdaily -previously intolerant to diuretic due to frequent urination and this making her job difficult -ok to transfer out of ICU -echo pending  -con't tele monitoring -Appreciate Cardiology's input- discussed during rounds. Needs OP HTN management.  -UDS not revealing of drugs that would explain uncontrolled HTN -TSH wNL - consider renal artery workup and aldosterone level as OP  Left sided chest pain  -checking ESR, CRP  -keep BP controlled -con't PPI in case this is GERD; can trial GI cocktail PRN -lidocaine patch  Carpel tunnel/ neuropathy - continue home Neurontin   Active smoker-  tobacco and mariajuana -cessation education  Stable to go to the floor. Anticipate home soon.  Best Practice (right click and "Reselect all SmartList Selections" daily)   Diet/type: Regular consistency (see orders) DVT prophylaxis: prophylactic heparin  GI prophylaxis: PPI Lines: N/A Foley:  N/A Code Status:  full code Last date of multidisciplinary  goals of care discussion [updated patient at bedside 1/9]  Labs   CBC: Recent Labs  Lab 02/28/21 1239 03/01/21 0655  WBC 4.8 5.5  HGB 12.8 13.8  HCT 39.3 42.3  MCV 85.2 86.0  PLT 324 337     Basic Metabolic Panel: Recent Labs  Lab 02/28/21 1239  NA 139  K 3.8  CL 104  CO2 25  GLUCOSE 98  BUN 9  CREATININE 0.73  CALCIUM 9.1    GFR: Estimated Creatinine Clearance: 71.8 mL/min (by C-G formula based on SCr of 0.73 mg/dL). Recent Labs  Lab 02/28/21 1239 03/01/21 0655  WBC 4.8 5.5     Liver Function Tests: Recent Labs  Lab 02/28/21 1239  AST 15  ALT 10  ALKPHOS 71  BILITOT 0.4  PROT 7.1  ALBUMIN 4.5    No results for input(s): LIPASE, AMYLASE in the last 168 hours. No results for input(s): AMMONIA in the last 168 hours.  ABG    Component Value Date/Time   TCO2 26 08/06/2015 [place] Tadarrius Burch, DO 03/01/21 2:52 PM [facility]

## 2021-03-02 DIAGNOSIS — R0789 Other chest pain: Secondary | ICD-10-CM

## 2021-03-02 DIAGNOSIS — I160 Hypertensive urgency: Secondary | ICD-10-CM | POA: Diagnosis not present

## 2021-03-02 LAB — BASIC METABOLIC PANEL
Anion gap: 12 (ref 5–15)
BUN: 12 mg/dL (ref 6–20)
CO2: 23 mmol/L (ref 22–32)
Calcium: 9.4 mg/dL (ref 8.9–10.3)
Chloride: 102 mmol/L (ref 98–111)
Creatinine, Ser: 0.79 mg/dL (ref 0.44–1.00)
GFR, Estimated: >60 mL/min (ref >60)
Glucose, Bld: 96 mg/dL (ref 70–99)
Potassium: 3.7 mmol/L (ref 3.5–5.1)
Sodium: 137 mmol/L (ref 135–145)

## 2021-03-02 MED ORDER — LOSARTAN POTASSIUM 50 MG PO TABS: 50.0000 mg | ORAL_TABLET | Freq: Every day | ORAL | 0 refills | Status: DC

## 2021-03-02 MED ORDER — AMLODIPINE BESYLATE 10 MG PO TABS: 10.0000 mg | ORAL_TABLET | Freq: Every day | ORAL | 0 refills | Status: DC

## 2021-03-02 MED ORDER — AMLODIPINE BESYLATE 10 MG PO TABS
10.0000 mg | ORAL_TABLET | Freq: Every day | ORAL | Status: DC
  Administered 2021-03-02: 10 mg via ORAL
  Filled 2021-03-02: qty 1

## 2021-03-02 NOTE — Discharge Summary (Signed)
Physician Discharge Summary  Patient ID: Rachel Fischer MRN: [id number] DOB/AGE: Aug 04, 1970 51 y.o.  Admit date: 02/28/2021 Discharge date: 03/02/2021  Admission Diagnoses: hypertensive urgency Chest pain  Discharge Diagnoses:  Principal Problem:   Hypertensive urgency Chest pain, atypical Hypokalemia Carpal tunnel, neuropathy- present on admit Tobacco abuse  Discharged Condition: good  Hospital Course: Ms. Laverdiere was admitted for evaluation of chest pain and to manage her BP acutely due to concern for cardiovascular disease complication of uncontrolled hypertension. Thankfully ACS and aortic dissection were ruled out. Due to ongoing pain with controlled BP, sed rate and CRP were checked to ensure this was not pericarditis-- labs do not suggest this as they were both normal levels.   She was controlled initially with IV antihypertensives and was transitioned to her home amlodipine with addition of losartan. The day of discharge she was started on the max dose of amlodipine to get her BP closer to goal. The goal is not to normalize her BP too quickly. She will follow up with her PCP in the next 1-2 weeks to ensure her BP is normalizing and titrate her medications as needed. I have requested her to have a BMP drawn to monitor renal function and K+ since starting an ARB. She has previously tolerated lisinopril.   She has been counseled on the importance of quitting smoking. She has started vaping with the goal to get off cigarettes then wean herself from [place]. I encouraged this plan with the goal of total cessation of all inhaled drugs.   Consults: cardiology and pulmonary/intensive care  Significant Diagnostic Studies:   CTA chest: IMPRESSION: 1. Negative for aortic dissection or aneurysm.  Clear lung fields. 2. Minimal aortic atherosclerosis. 3. No CT evidence for acute intra-abdominal or pelvic abnormality.  BMP Latest Ref Rng & Units 03/02/2021 03/01/2021 02/28/2021  Glucose 70 - 99  mg/dL 96 100(H) 98  BUN 6 - 20 mg/dL 12 5(L) 9  Creatinine 0.44 - 1.00 mg/dL 0.79 0.75 0.73  BUN/Creat Ratio 9 - 23 - - -  Sodium 135 - 145 mmol/L 137 139 139  Potassium 3.5 - 5.1 mmol/L 3.7 3.4(L) 3.8  Chloride 98 - 111 mmol/L 102 102 104  CO2 22 - 32 mmol/L 23 29 25   Calcium 8.9 - 10.3 mg/dL 9.4 9.2 9.1     Treatments:  Antihypertensives Pain medications   Discharge Exam: Blood pressure (!) 176/88, pulse 68, temperature 97.9 F (36.6 C), temperature source Oral, resp. rate 11, height 5' (1.524 m), weight 64.1 kg, SpO2 100 %. Healthy appearing woman sitting up in bed in NAD, on cell phone Herman/AT, eyes anicteric Breathing comfortably on RA, CTAB S1S2, RRR Abd soft, NT No LE edema, no cyanosis Awake, alert, moving all extremities. Skin warm, dry  Disposition: home  Discharge Instructions     Discharge   Complete by: As directed    Discharge activity:   Complete by: As directed    Can return to work 03/08/21. Work note has been provided.   Discharge instructions   Complete by: As directed    -Please return to the hospital or call your PCP if you develop dizziness or other side effects.  -Please make appointment with primary care doctor in the next 2 weeks to get BP rechecked. They will need to give you refills for your medications. Please have them check your kidney function and potassium during this visit.      Allergies as of 03/02/2021       Reactions  Ciprofloxacin Other (See Comments)   Generalized muscle and joint pain.   Morphine And Related Itching, Nausea And Vomiting   Penicillins Hives   Has patient had a PCN reaction causing immediate rash, facial/tongue/throat swelling, SOB or lightheadedness with hypotension: No Has patient had a PCN reaction causing severe rash involving mucus membranes or skin necrosis: No Has patient had a PCN reaction that required hospitalization No Has patient had a PCN reaction occurring within the last 10 years: No If all of  the above answers are "NO", then may proceed with Cephalosporin use.        Medication List     STOP taking these medications    naproxen sodium 220 MG tablet Commonly known as: ALEVE       TAKE these medications    amLODipine 10 MG tablet Commonly known as: NORVASC Take 1 tablet (10 mg total) by mouth daily. What changed:  medication strength how much to take   calcium carbonate 500 MG chewable tablet Commonly known as: TUMS - dosed in mg elemental calcium Chew 2-4 tablets by mouth daily as needed for indigestion or heartburn.   cyclobenzaprine 5 MG tablet Commonly known as: FLEXERIL Take 5 mg by mouth 3 (three) times daily as needed for muscle spasms.   gabapentin 600 MG tablet Commonly known as: NEURONTIN Take 600 mg by mouth 2 (two) times daily as needed (pain).   losartan 50 MG tablet Commonly known as: COZAAR Take 1 tablet (50 mg total) by mouth daily.   simethicone 125 MG chewable tablet Commonly known as: MYLICON Chew 0000000 mg by mouth every 6 (six) hours as needed for flatulence.         Suggestions to PCP: BMP at follow up since starting ARB.    Signed: Julian Hy 03/02/2021, 6:52 PM

## 2021-03-02 NOTE — Progress Notes (Signed)
Patient discharged to home. Discharge instructions reviewed with patient. No changes in patient status. PIV removed. Patient transported off unit via wheelchair with personal belongings.

## 2021-03-30 ENCOUNTER — Other Ambulatory Visit: Payer: Self-pay | Admitting: Critical Care Medicine

## 2021-03-30 DIAGNOSIS — I10 Essential (primary) hypertension: Secondary | ICD-10-CM

## 2021-04-08 NOTE — Progress Notes (Signed)
I, Rachel Fischer, LAT, ATC, am serving as scribe for Dr. Lynne Leader.  Rachel Fischer is a 51 y.o. female who presents to [facility] today for R wrist and hand pain and paresthesias in her R fingers 3-5 since July 2022 due to R ulnar and median nerve neuropathy/CTS per NCV/EMG.  She was last seen by Dr. [name] on 01/04/21 and had a R wrist/ECU steroid injection.  She was also referred to hand therapy and advised to use a cubital tunnel brace at night and a thumb loop at work.  She was encouraged to proceed w/ her previously ordered NCV/EMG and had that on 02/09/21 indicating CTS and both median and ulnar nerve neuropathy.  Today, pt reports that  her R wrist and elbow pain is worsening, particularly her R elbow pain.  She states that the steroid injection did help her wrist pain but pain is coming back.  She attended one hand therapy session but could not con't due to cost.  She has stopped taking the gabapentin and Flexeril.  Overall she thinks that the right hand paresthesias are similar to her previous cubital tunnel syndrome which responded very well to an ulnar nerve transposition.  She is more interested in surgery and wants to be just done with her nerve issues and would like to avoid an injection.  Diagnostic testing: UE NCV/EMG- 02/09/21;  c-spine XR- 10/11/17 Pertinent review of systems: No fevers or chills  Relevant historical information: Hypertension   Exam:  BP 128/78 (BP Location: Left Arm, Patient Position: Sitting, Cuff Size: Normal)    Pulse 96    Ht 5' (1.524 m)    Wt 151 lb 12.8 oz (68.9 kg)    SpO2 97%    BMI 29.65 kg/m  General: Well Developed, well nourished, and in no acute distress.   MSK: Right elbow normal. Positive Tinel's at cubital tunnel.  Normal elbow motion. Right wrist: Hypopigmentation dorsal ulnar wrist from previous steroid injection Normal wrist motion.  Intact strength extension. Mildly positive Tinel's at carpal  tunnel.   Lab and Radiology Results  Nerve conduction study dated February 09, 2021. Patient Complaints: This is a 51 year old female referred for evaluation of right hand paresthesias.   NCV & EMG Findings: Extensive electrodiagnostic testing of the right upper extremity shows:  Right mixed palmar sensory responses show prolonged latency.  Bilateral median, bilateral ulnar, and left mixed palmar sensory responses are within normal limits. Right ulnar motor response shows slowed conduction velocity across the elbow (A Elbow-B Elbow, 45 m/s).  Left ulnar and bilateral median motor responses are within normal limits.  There is no evidence of active or chronic motor axonal loss changes affecting any of the tested muscles.  Motor unit configuration and recruitment pattern is within normal limits.     Impression: Right ulnar neuropathy with slowing across the elbow, demyelinating, mild. Right median neuropathy at or distal to the wrist, consistent with a clinical diagnosis of carpal tunnel syndrome.  Overall, these findings are very mild in degree electrically. Electrodiagnostic testing of the left upper extremity is within normal limits     ___________________________ [name], DO     Assessment and Plan: 51 y.o. female with right hand paresthesias due to a combination of cubital tunnel syndrome carpal tunnel syndrome.  She is trying conservative management strategies including cubital tunnel bracing and carpal tunnel bracing at night which are not effective.  She is willing to proceed to surgery and would like  to avoid injections as she has found them to be not helpful in the past for this issue on her left side.  I think this is reasonable.  Plan to refer to hand surgery to discuss her surgical options.  I wonder if is possible to have both the carpal tunnel and cubital tunnel surgery at the same time.  Recheck with me as needed.   PDMP not reviewed this encounter. Orders Placed  This Encounter  Procedures   [place] LIMITED JOINT SPACE STRUCTURES UP RIGHT(NO LINKED CHARGES)    Order Specific Question:   Reason for Exam (SYMPTOM  OR DIAGNOSIS REQUIRED)    Answer:   R elbow    Order Specific Question:   Preferred imaging location?    Answer:   [facility]   Ambulatory referral to Orthopedic Surgery    Referral Priority:   Routine    Referral Type:   Surgical    Referral Reason:   Specialty Services Required    Requested Specialty:   Orthopedic Surgery    Number of Visits Requested:   1   No orders of the defined types were placed in this encounter.    Discussed warning signs or symptoms. Please see discharge instructions. Patient expresses understanding.   The above documentation has been reviewed and is accurate and complete Lynne Leader, M.D.

## 2021-04-12 ENCOUNTER — Encounter: Payer: Self-pay | Admitting: Family Medicine

## 2021-04-12 ENCOUNTER — Ambulatory Visit: Payer: Self-pay

## 2021-04-12 ENCOUNTER — Other Ambulatory Visit: Payer: Self-pay

## 2021-04-12 ENCOUNTER — Ambulatory Visit (INDEPENDENT_AMBULATORY_CARE_PROVIDER_SITE_OTHER): Payer: 59 | Admitting: Family Medicine

## 2021-04-12 VITALS — BP 128/78 | HR 96 | Ht 60.0 in | Wt 151.8 lb

## 2021-04-12 DIAGNOSIS — G5621 Lesion of ulnar nerve, right upper limb: Secondary | ICD-10-CM

## 2021-04-12 DIAGNOSIS — M25531 Pain in right wrist: Secondary | ICD-10-CM | POA: Diagnosis not present

## 2021-04-12 DIAGNOSIS — G5601 Carpal tunnel syndrome, right upper limb: Secondary | ICD-10-CM

## 2021-04-12 NOTE — Patient Instructions (Addendum)
Good to see you today.  I've referred you to Dr. Tempie Donning at [facility].  Their office will call you to schedule but please let us know if you haven't heard from them in one week regarding scheduling.  Follow-up: as needed

## 2021-04-13 ENCOUNTER — Other Ambulatory Visit: Payer: Self-pay | Admitting: Critical Care Medicine

## 2021-04-13 DIAGNOSIS — I10 Essential (primary) hypertension: Secondary | ICD-10-CM

## 2021-04-20 ENCOUNTER — Other Ambulatory Visit: Payer: Self-pay

## 2021-04-20 ENCOUNTER — Encounter: Payer: Self-pay | Admitting: Orthopedic Surgery

## 2021-04-20 ENCOUNTER — Ambulatory Visit (INDEPENDENT_AMBULATORY_CARE_PROVIDER_SITE_OTHER): Payer: 59 | Admitting: Orthopedic Surgery

## 2021-04-20 VITALS — BP 163/100 | HR 109 | Ht 60.0 in | Wt 152.0 lb

## 2021-04-20 DIAGNOSIS — G5621 Lesion of ulnar nerve, right upper limb: Secondary | ICD-10-CM

## 2021-04-20 DIAGNOSIS — G5601 Carpal tunnel syndrome, right upper limb: Secondary | ICD-10-CM | POA: Diagnosis not present

## 2021-04-20 NOTE — Progress Notes (Signed)
Office Visit Note   Patient: Rachel Fischer           Date of Birth: 1970/02/28           MRN: [id number] Visit Date: 04/20/2021              Requested by: Gregor Hams, MD [place],  [place] [address] PCP: Trey Sailors, [place]   Assessment & Plan: Visit Diagnoses:  1. Carpal tunnel syndrome of right wrist   2. Cubital tunnel syndrome on right     Plan: We discussed the diagnosis, prognosis, and both conservative and operative treatment options for carpal and cubital tunnel syndrome.  She has tried gabapentin and bracing with minimal symptom relief.   After our discussion, the patient has elected to proceed with right carpal and cubital tunnel releases.  We reviewed the benefits of surgery and the potential risks including, but not limited to, persistent symptoms, infection, damage to nearby nerves and blood vessels, delayed wound healing, need for additional surgery.    All patient concerns and questions were addressed.  A surgical date will be confirmed with the patient.    Follow-Up Instructions: No follow-ups on file.   Orders:  No orders of the defined types were placed in this encounter.  No orders of the defined types were placed in this encounter.     Procedures: No procedures performed   Clinical Data: No additional findings.   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     This is a 51 year old right-hand-dominant female who presents with numbness and paresthesias in the right hand.  This been going on for about 2 and half years.  She recently obtained insurance and is able to seek treatment.  She describes constant tingling in the right small finger and radial half of the ring finger.  She also describes occasional numbness involving the thumb, index, and  middle fingers.  She is notes that she is been dropping things more frequently lately.  The example she gives is unexpectedly dropping a cup of coffee.  She wakes 3-4 nights per week with burning, tingling in her hand and has to shake her hands for relief.  She thinks her symptoms are worsening.  She has tried gabapentin and has been wearing cubital tunnel braces for the last 6 months with minimal symptom relief.  She has no history of diabetes, hypothyroidism, cervical spine disease, wrist trauma, or inflammatory arthropathy.  An EMG/nerve conduction study was obtained on 02/09/2021 which was notable for right ulnar neuropathy across the elbow and right median neuropathy at the wrist both mild in severity.   Review of Systems   Objective: Vital Signs: BP (!) 163/100 (BP Location: Left Arm, Patient Position: Sitting)    Pulse (!) 109    Ht 5' (1.524 m)    Wt 152 lb (68.9 kg)    BMI 29.69 kg/m   Physical Exam Constitutional:      Appearance: Normal appearance.  Cardiovascular:     Rate and Rhythm: Normal rate.     Pulses: Normal pulses.  Pulmonary:     Effort: Pulmonary effort is normal.  Skin:    General: Skin is warm and dry.     Capillary Refill: Capillary refill takes less than  2 seconds.  Neurological:     Mental Status: She is alert.    Right Hand Exam   Tenderness  The patient is experiencing no tenderness.   Range of Motion  The patient has normal right wrist ROM.   Muscle Strength  The patient has normal right wrist strength.  Other  Erythema: absent Sensation: normal Pulse: present  Comments:  + Tinel at both wrist and elbow.  + Phalen sign at wrist in ~10 seconds.  No obvious ulnar nerve instability at elbow.  5/5 thenar and FDI motor strength without atrophy.      Specialty Comments:  No specialty comments available.  Imaging: No results found.   PMFS History: Patient Active Problem List   Diagnosis Date Noted   Hypertensive urgency 02/28/2021    Right wrist tendinitis 01/04/2021   Cubital tunnel syndrome on right 01/04/2021   Carpal tunnel syndrome of right wrist 06/27/2018   Pain and swelling of right wrist 06/27/2018   Smoker 06/27/2018   Decreased thyroid stimulating hormone (TSH) level 06/27/2018   Ovarian cyst 03/17/2016   Family history of breast cancer 03/17/2016   Blurred vision    Cephalalgia    Vision changes    Secondary hypertension, unspecified    Headache 08/06/2015   Pulmonary nodule 06/07/2011   Past Medical History:  Diagnosis Date   Anemia    Colon polyps    Decreased thyroid stimulating hormone (TSH) level    Depression    has not been diagnosed   Hx of trichomoniasis    Hyperlipidemia    Hypertension    not currently taking meds due to financial reasons   Lung nodule    Migraine headache    migraines; gets them 4/month   Tobacco use    Vitamin D deficiency     Family History  Problem Relation Age of Onset   Breast cancer Mother 63   Hypertension Mother    Hyperlipidemia Mother    CAD Mother    Hypertension Father     Past Surgical History:  Procedure Laterality Date   ABDOMINAL HYSTERECTOMY     ARM NEUROPLASTY Left    c section x 4     CESAREAN SECTION     FOOT SURGERY     Social History   Occupational History   Occupation: unemployed    Fish farm manager: UNEMPLOYED  Tobacco Use   Smoking status: Every Day    Packs/day: 0.50    Years: 19.00    Pack years: 9.50    Types: Cigarettes   Smokeless tobacco: Never  Vaping Use   Vaping Use: Some days  Substance and Sexual Activity   Alcohol use: No   Drug use: Yes    Types: Marijuana   Sexual activity: Yes    Birth control/protection: Surgical

## 2021-04-26 ENCOUNTER — Other Ambulatory Visit: Payer: Self-pay

## 2021-04-26 ENCOUNTER — Encounter (HOSPITAL_BASED_OUTPATIENT_CLINIC_OR_DEPARTMENT_OTHER): Payer: Self-pay | Admitting: Orthopedic Surgery

## 2021-05-05 ENCOUNTER — Other Ambulatory Visit: Payer: Self-pay

## 2021-05-05 ENCOUNTER — Ambulatory Visit (HOSPITAL_BASED_OUTPATIENT_CLINIC_OR_DEPARTMENT_OTHER)
Admission: RE | Admit: 2021-05-05 | Discharge: 2021-05-05 | Disposition: A | Discharge status: Home / Self Care | Payer: 59 | Attending: Orthopedic Surgery | Admitting: Orthopedic Surgery

## 2021-05-05 ENCOUNTER — Encounter (HOSPITAL_BASED_OUTPATIENT_CLINIC_OR_DEPARTMENT_OTHER): Payer: Self-pay | Admitting: Orthopedic Surgery

## 2021-05-05 ENCOUNTER — Ambulatory Visit (HOSPITAL_BASED_OUTPATIENT_CLINIC_OR_DEPARTMENT_OTHER): Payer: 59 | Admitting: Anesthesiology

## 2021-05-05 ENCOUNTER — Encounter (HOSPITAL_BASED_OUTPATIENT_CLINIC_OR_DEPARTMENT_OTHER)
Admission: RE | Disposition: A | Discharge status: Home / Self Care | Payer: Self-pay | Source: Home / Self Care | Attending: Orthopedic Surgery

## 2021-05-05 DIAGNOSIS — G5601 Carpal tunnel syndrome, right upper limb: Secondary | ICD-10-CM

## 2021-05-05 DIAGNOSIS — G5621 Lesion of ulnar nerve, right upper limb: Secondary | ICD-10-CM | POA: Insufficient documentation

## 2021-05-05 DIAGNOSIS — I10 Essential (primary) hypertension: Secondary | ICD-10-CM | POA: Diagnosis not present

## 2021-05-05 DIAGNOSIS — F17200 Nicotine dependence, unspecified, uncomplicated: Secondary | ICD-10-CM | POA: Diagnosis not present

## 2021-05-05 HISTORY — PX: ULNAR TUNNEL RELEASE: SHX820

## 2021-05-05 SURGERY — RELEASE, CUBITAL TUNNEL
Anesthesia: General | Site: Arm Lower | Laterality: Right

## 2021-05-05 MED ORDER — MIDAZOLAM HCL 2 MG/2ML IJ SOLN
INTRAMUSCULAR | Status: AC
  Filled 2021-05-05: qty 2

## 2021-05-05 MED ORDER — 0.9 % SODIUM CHLORIDE (POUR BTL) OPTIME
TOPICAL | Status: DC | PRN
  Administered 2021-05-05: 150 mL

## 2021-05-05 MED ORDER — DEXAMETHASONE SODIUM PHOSPHATE 10 MG/ML IJ SOLN
INTRAMUSCULAR | Status: DC | PRN
  Administered 2021-05-05: 4 mg via INTRAVENOUS

## 2021-05-05 MED ORDER — FENTANYL CITRATE (PF) 100 MCG/2ML IJ SOLN
INTRAMUSCULAR | Status: DC | PRN
Start: 2021-05-05 — End: 2021-05-05
  Administered 2021-05-05: 25 ug via INTRAVENOUS
  Administered 2021-05-05: 50 ug via INTRAVENOUS
  Administered 2021-05-05: 25 ug via INTRAVENOUS

## 2021-05-05 MED ORDER — KETOROLAC TROMETHAMINE 30 MG/ML IJ SOLN
30.0000 mg | Freq: Once | INTRAMUSCULAR | Status: AC
  Administered 2021-05-05: 30 mg via INTRAVENOUS

## 2021-05-05 MED ORDER — LACTATED RINGERS IV SOLN: INTRAVENOUS | Status: DC

## 2021-05-05 MED ORDER — ONDANSETRON HCL 4 MG/2ML IJ SOLN
INTRAMUSCULAR | Status: AC
  Filled 2021-05-05: qty 2

## 2021-05-05 MED ORDER — KETOROLAC TROMETHAMINE 30 MG/ML IJ SOLN
INTRAMUSCULAR | Status: AC
  Filled 2021-05-05: qty 1

## 2021-05-05 MED ORDER — BUPIVACAINE HCL (PF) 0.25 % IJ SOLN
INTRAMUSCULAR | Status: DC | PRN
  Administered 2021-05-05: 14 mL

## 2021-05-05 MED ORDER — CEFAZOLIN SODIUM-DEXTROSE 2-4 GM/100ML-% IV SOLN
2.0000 g | INTRAVENOUS | Status: AC
  Administered 2021-05-05: 2 g via INTRAVENOUS

## 2021-05-05 MED ORDER — ACETAMINOPHEN 10 MG/ML IV SOLN
1000.0000 mg | Freq: Once | INTRAVENOUS | Status: DC | PRN
  Administered 2021-05-05: 1000 mg via INTRAVENOUS

## 2021-05-05 MED ORDER — OXYCODONE HCL 5 MG PO TABS: 5.0000 mg | ORAL_TABLET | ORAL | 0 refills | Status: AC | PRN

## 2021-05-05 MED ORDER — PROPOFOL 10 MG/ML IV BOLUS
INTRAVENOUS | Status: DC | PRN
  Administered 2021-05-05: 200 mg via INTRAVENOUS

## 2021-05-05 MED ORDER — FENTANYL CITRATE (PF) 100 MCG/2ML IJ SOLN
INTRAMUSCULAR | Status: AC
  Filled 2021-05-05: qty 2

## 2021-05-05 MED ORDER — CEFAZOLIN SODIUM-DEXTROSE 2-4 GM/100ML-% IV SOLN
INTRAVENOUS | Status: AC
  Filled 2021-05-05: qty 100

## 2021-05-05 MED ORDER — ACETAMINOPHEN 10 MG/ML IV SOLN
INTRAVENOUS | Status: AC
  Filled 2021-05-05: qty 100

## 2021-05-05 MED ORDER — MIDAZOLAM HCL 5 MG/5ML IJ SOLN
INTRAMUSCULAR | Status: DC | PRN
Start: 2021-05-05 — End: 2021-05-05
  Administered 2021-05-05: 2 mg via INTRAVENOUS

## 2021-05-05 MED ORDER — FENTANYL CITRATE (PF) 100 MCG/2ML IJ SOLN
25.0000 ug | INTRAMUSCULAR | Status: DC | PRN
  Administered 2021-05-05: 25 ug via INTRAVENOUS

## 2021-05-05 MED ORDER — OXYCODONE HCL 5 MG PO TABS
ORAL_TABLET | ORAL | Status: AC
  Filled 2021-05-05: qty 1

## 2021-05-05 MED ORDER — ONDANSETRON HCL 4 MG/2ML IJ SOLN
INTRAMUSCULAR | Status: DC | PRN
  Administered 2021-05-05: 4 mg via INTRAVENOUS

## 2021-05-05 MED ORDER — OXYCODONE HCL 5 MG PO TABS
5.0000 mg | ORAL_TABLET | Freq: Once | ORAL | Status: AC
  Administered 2021-05-05: 5 mg via ORAL

## 2021-05-05 MED ORDER — LIDOCAINE HCL (CARDIAC) PF 100 MG/5ML IV SOSY
PREFILLED_SYRINGE | INTRAVENOUS | Status: DC | PRN
  Administered 2021-05-05: 60 mg via INTRATRACHEAL

## 2021-05-05 MED ORDER — PROPOFOL 10 MG/ML IV BOLUS
INTRAVENOUS | Status: AC
  Filled 2021-05-05: qty 20

## 2021-05-05 MED ORDER — AMISULPRIDE (ANTIEMETIC) 5 MG/2ML IV SOLN: 10.0000 mg | Freq: Once | INTRAVENOUS | Status: DC | PRN

## 2021-05-05 SURGICAL SUPPLY — 52 items
ADH SKN CLS APL DERMABOND .7 (GAUZE/BANDAGES/DRESSINGS)
APL PRP STRL LF DISP 70% ISPRP (MISCELLANEOUS) ×1
BLADE SURG 15 STRL LF DISP TIS (BLADE) ×1 IMPLANT
BLADE SURG 15 STRL SS (BLADE) ×2
BNDG CMPR 9X4 STRL LF SNTH (GAUZE/BANDAGES/DRESSINGS) ×1
BNDG ELASTIC 3X5.8 VLCR STR LF (GAUZE/BANDAGES/DRESSINGS) ×2 IMPLANT
BNDG ESMARK 4X9 LF (GAUZE/BANDAGES/DRESSINGS) ×2 IMPLANT
BNDG GAUZE ELAST 4 BULKY (GAUZE/BANDAGES/DRESSINGS) ×2 IMPLANT
BNDG PLASTER X FAST 3X3 WHT LF (CAST SUPPLIES) IMPLANT
BNDG PLSTR 9X3 FST ST WHT (CAST SUPPLIES)
CHLORAPREP W/TINT 26 (MISCELLANEOUS) ×2 IMPLANT
CORD BIPOLAR FORCEPS 12FT (ELECTRODE) ×2 IMPLANT
COVER BACK TABLE 60X90IN (DRAPES) ×2 IMPLANT
COVER MAYO STAND STRL (DRAPES) ×2 IMPLANT
CUFF TOURN SGL QUICK 18X4 (TOURNIQUET CUFF) IMPLANT
CUFF TOURN SGL QUICK 24 (TOURNIQUET CUFF)
CUFF TRNQT CYL 24X4X16.5-23 (TOURNIQUET CUFF) IMPLANT
DERMABOND ADVANCED (GAUZE/BANDAGES/DRESSINGS)
DERMABOND ADVANCED .7 DNX12 (GAUZE/BANDAGES/DRESSINGS) IMPLANT
DRAPE C-ARM 35X43 STRL (DRAPES) ×2 IMPLANT
DRAPE EXTREMITY T 121X128X90 (DISPOSABLE) ×2 IMPLANT
DRAPE IMP U-DRAPE 54X76 (DRAPES) IMPLANT
DRAPE SURG 17X23 STRL (DRAPES) IMPLANT
GAUZE SPONGE 4X4 12PLY STRL (GAUZE/BANDAGES/DRESSINGS) ×2 IMPLANT
GAUZE XEROFORM 1X8 LF (GAUZE/BANDAGES/DRESSINGS) IMPLANT
GLOVE SURG ENC MOIS LTX SZ7 (GLOVE) ×2 IMPLANT
GLOVE SURG UNDER POLY LF SZ7 (GLOVE) ×2 IMPLANT
GOWN STRL REUS W/ TWL LRG LVL3 (GOWN DISPOSABLE) ×1 IMPLANT
GOWN STRL REUS W/ TWL XL LVL3 (GOWN DISPOSABLE) ×1 IMPLANT
GOWN STRL REUS W/TWL LRG LVL3 (GOWN DISPOSABLE) ×2
GOWN STRL REUS W/TWL XL LVL3 (GOWN DISPOSABLE) ×2
NDL HYPO 25X1 1.5 SAFETY (NEEDLE) IMPLANT
NEEDLE HYPO 25X1 1.5 SAFETY (NEEDLE) IMPLANT
NS IRRIG 1000ML POUR BTL (IV SOLUTION) ×2 IMPLANT
PACK BASIN DAY SURGERY FS (CUSTOM PROCEDURE TRAY) ×2 IMPLANT
PAD CAST 3X4 CTTN HI CHSV (CAST SUPPLIES) ×1 IMPLANT
PADDING CAST COTTON 3X4 STRL (CAST SUPPLIES) ×2
SLEEVE SCD COMPRESS KNEE MED (STOCKING) IMPLANT
STRIP CLOSURE SKIN 1/2X4 (GAUZE/BANDAGES/DRESSINGS) IMPLANT
SUCTION FRAZIER HANDLE 10FR (MISCELLANEOUS)
SUCTION TUBE FRAZIER 10FR DISP (MISCELLANEOUS) IMPLANT
SUT ETHILON 3 0 PS 1 (SUTURE) ×2 IMPLANT
SUT ETHILON 4 0 PS 2 18 (SUTURE) IMPLANT
SUT MNCRL AB 3-0 PS2 18 (SUTURE) IMPLANT
SUT MNCRL AB 4-0 PS2 18 (SUTURE) IMPLANT
SUT VICRYL 0 SH 27 (SUTURE) IMPLANT
SUT VICRYL 4-0 PS2 18IN ABS (SUTURE) IMPLANT
SYR BULB EAR ULCER 3OZ GRN STR (SYRINGE) ×2 IMPLANT
SYR CONTROL 10ML LL (SYRINGE) IMPLANT
TOWEL GREEN STERILE FF (TOWEL DISPOSABLE) ×4 IMPLANT
TUBE CONNECTING 20X1/4 (TUBING) IMPLANT
UNDERPAD 30X36 HEAVY ABSORB (UNDERPADS AND DIAPERS) ×2 IMPLANT

## 2021-05-05 NOTE — Interval H&P Note (Signed)
History and Physical Interval Note: ? ?05/05/2021 ?8:57 AM ? ?Rachel Fischer  has presented today for surgery, with the diagnosis of RIGHT CARPAL TUNNEL VS CUBITAL TUNNEL SYNDROME.  The various methods of treatment have been discussed with the patient and family. After consideration of risks, benefits and other options for treatment, the patient has consented to  Procedure(s): ?RIGHT CUBITAL TUNNEL RELEASE VS CARPAL TUNNEL RELEASE (Right) as a surgical intervention.  The patient's history has been reviewed, patient examined, no change in status, stable for surgery.  I have reviewed the patient's chart and labs.  Questions were answered to the patient's satisfaction.   ? ? ?Wade Sigala Willella Harding ? ? ?

## 2021-05-05 NOTE — Brief Op Note (Signed)
05/05/2021 ? ?11:26 AM ? ?PATIENT:  Rachel Fischer  51 y.o. female ? ?PRE-OPERATIVE DIAGNOSIS:  RIGHT CARPAL TUNNEL VS CUBITAL TUNNEL SYNDROME ? ?POST-OPERATIVE DIAGNOSIS:  RIGHT CARPAL TUNNEL VS CUBITAL TUNNEL SYNDROME ? ?PROCEDURE:   ?RIGHT CARPAL AND CUBITAL TUNNEL RELEASE (RIGHT) ? ?SURGEON:  Surgeon(s) and Role: ?   * Marlyne Beards, MD - Primary ? ?PHYSICIAN ASSISTANT:  ? ?ASSISTANTS: none  ? ?ANESTHESIA:   general, local  ? ?EBL:  5 mL  ? ?BLOOD ADMINISTERED:none ? ?DRAINS: none  ? ?LOCAL MEDICATIONS USED:  MARCAINE    and LIDOCAINE  ? ?SPECIMEN:  No Specimen ? ?DISPOSITION OF SPECIMEN:  N/A ? ?COUNTS:  YES ? ?TOURNIQUET:   ?Total Tourniquet Time Documented: ?Upper Arm (Right) - 24 minutes ?Total: Upper Arm (Right) - 24 minutes ? ? ?DICTATION: .Dragon Dictation ? ?PLAN OF CARE: Discharge to home after PACU ? ?PATIENT DISPOSITION:  PACU - hemodynamically stable. ?  ?Delay start of Pharmacological VTE agent (>24hrs) due to surgical blood loss or risk of bleeding: not applicable ? ?

## 2021-05-05 NOTE — Discharge Instructions (Addendum)
?Post Anesthesia Home Care Instructions ? ?Activity: ?Get plenty of rest for the remainder of the day. A responsible individual must stay with you for 24 hours following the procedure.  ?For the next 24 hours, DO NOT: ?-Drive a car ?-Advertising copywriter ?-Drink alcoholic beverages ?-Take any medication unless instructed by your physician ?-Make any legal decisions or sign important papers. ? ?Meals: ?Start with liquid foods such as gelatin or soup. Progress to regular foods as tolerated. Avoid greasy, spicy, heavy foods. If nausea and/or vomiting occur, drink only clear liquids until the nausea and/or vomiting subsides. Call your physician if vomiting continues. ? ?Special Instructions/Symptoms: ?Your throat may feel dry or sore from the anesthesia or the breathing tube placed in your throat during surgery. If this causes discomfort, gargle with warm salt water. The discomfort should disappear within 24 hours. ? ?If you had a scopolamine patch placed behind your ear for the management of post- operative nausea and/or vomiting: ? ?1. The medication in the patch is effective for 72 hours, after which it should be removed.  Wrap patch in a tissue and discard in the trash. Wash hands thoroughly with soap and water. ?2. You may remove the patch earlier than 72 hours if you experience unpleasant side effects which may include dry mouth, dizziness or visual disturbances. ?3. Avoid touching the patch. Wash your hands with soap and water after contact with the patch. ?    ? ? ?Call your surgeon if you experience:  ? ?1.  Fever over 101.0. ?2.  Inability to urinate. ?3.  Nausea and/or vomiting. ?4.  Extreme swelling or bruising at the surgical site. ?5.  Continued bleeding from the incision. ?6.  Increased pain, redness or drainage from the incision. ?7.  Problems related to your pain medication. ?8.  Any problems and/or concerns  ? ? ? ?Waylan Rocher, M.D. ?Hand Surgery ? ?POST-OPERATIVE DISCHARGE  INSTRUCTIONS ? ? ?PRESCRIPTIONS: ?- You have been given a prescription to be taken as directed for post-operative pain control.  You may also take over the counter ibuprofen/aleve and tylenol for pain. Take this as directed on the packaging. Do not exceed 3000 mg tylenol/acetaminophen in 24 hours. ? ?Ibuprofen 600-800 mg (3-4) tablets by mouth every 6 hours as needed for pain.  ? ?OR ? ?Aleve 2 tablets by mouth every 12 hours (twice daily) as needed for pain. ?  ?AND/OR ? ?Tylenol 1000 mg (2 tablets) every 8 hours as needed for pain. ? ?- Please use your pain medication carefully, as refills are limited and you may not be provided with one.  As stated above, please use over the counter pain medicine - it will also be helpful with decreasing your swelling.  ? ? ?ANESTHESIA: ?-After your surgery, post-surgical discomfort or pain is likely. This discomfort can last several days to a few weeks. At certain times of the day your discomfort may be more intense.  ? ?Did you receive a nerve block?  ? ?- A nerve block can provide pain relief for one hour to two days after your surgery. As long as the nerve block is working, you will experience little or no sensation in the area the surgeon operated on.  ?- As the nerve block wears off, you will begin to experience pain or discomfort. It is very important that you begin taking your prescribed pain medication before the nerve block fully wears off. Treating your pain at the first sign of the block wearing off will ensure your pain is  better controlled and more tolerable when full-sensation returns. Do not wait until the pain is intolerable, as the medicine will be less effective. It is better to treat pain in advance than to try and catch up.  ? ?General Anesthesia:  ?If you did not receive a nerve block during your surgery, you will need to start taking your pain medication shortly after your surgery and should continue to do so as prescribed by your surgeon.   ? ? ?ICE AND  ELEVATION: ?- You may use ice for the first 48-72 hours, but it is not critical.   ?- Motion of your fingers is very important to decrease the swelling.  ?- Elevation, as much as possible for the next 48 hours, is critical for decreasing swelling as well as for pain relief. Elevation means when you are seated or lying down, you hand should be at or above your heart. When walking, the hand needs to be at or above the level of your elbow.  ?- If the bandage gets too tight, it may need to be loosened. Please contact our office and we will instruct you in how to do this.  ? ? ?SURGICAL BANDAGES:  ?- Keep your dressing and/or splint clean and dry at all times.  You can remove your dressing 7 days from now and change with a dry dressing thereafter.  Keep incisions covered.  ?- You may place a plastic bag over your bandage to shower, but be careful, do not get your bandages wet.  ?- After the bandages have been removed, it is OK to get the stitches wet in a shower or with hand washing. Do Not soak or submerge the wound yet. Please do not use lotions or creams on the stitches.   ?  ? ?HAND THERAPY:  ?- You may not need any. If you do, we will begin this at your follow up visit in the clinic.  ? ? ?ACTIVITY AND WORK: ?- You are encouraged to move any fingers which are not in the bandage.  ?- Light use of the fingers is allowed to assist the other hand with daily hygiene and eating, but strong gripping or lifting is often uncomfortable and should be avoided.  ?- You might miss a variable period of time from work and hopefully this issue has been discussed prior to surgery. You may not do any heavy work with your affected hand for about 2 weeks.  ? ? ?[facility] ?[address] ?[place],  [place]  [address] ?[phone]  ? ?Oxycodone 5mg  given at 12:50pm today ?

## 2021-05-05 NOTE — Op Note (Signed)
? ?Date of Surgery: 05/05/2021 ? ?INDICATIONS: Ms. Krebbs is a 51 y.o.-year-old female with right carpal and cubital tunnel symptoms that have failed conservative management with bracing and gabapentin.  Electrodiagnostic studies suggested mild carpal and cubital tunnel syndrome.  Risks, benefits, and alternatives to surgery were again discussed with the patient wishing to proceed with surgery.  Informed consent was signed after our discussion.  ? ?PREOPERATIVE DIAGNOSIS:  ?Right carpal tunnel syndrome ?Right cubital tunnel syndrome ? ?POSTOPERATIVE DIAGNOSIS: Same. ? ?PROCEDURE:  ?Right carpal tunnel release ?Right cubital tunnel release ? ? ?SURGEON: Audria Nine, M.D. ? ?ASSIST:  ? ?ANESTHESIA:  general, local ? ?IV FLUIDS AND URINE: See anesthesia. ? ?ESTIMATED BLOOD LOSS: 10 mL. ? ?IMPLANTS: * No implants in log *  ? ?DRAINS: None ? ?COMPLICATIONS: None ? ?DESCRIPTION OF PROCEDURE: The patient was met in the preoperative holding area where the surgical site was marked and the consent form was verified.  The patient was then taken to the operating room and transferred to the operating table.  All bony prominences were well padded.  The operative extremity was prepped and draped in the usual and sterile fashion.  A formal time-out was performed to confirm that this was the correct patient, surgery, side, and site.  ? ?Following the timeout, the limb was exsanguinated with an Esmarch bandage and the tourniquet inflated to 250 mmHg.  A longitudinal incision was made between the medial epicondyle and the olecranon just posterior to the medial epicondyle.  The skin and subcutaneous tissue was incised.  Small crossing vessels were coagulated the bipolar cautery.  Blunt dissection was used to identify the ulnar nerve just posterior and proximal to the epicondyle.  The ulnar nerve was dissected distally and Osborne's ligament was sharply released.  The fascia overlying the 2 heads of the flexor carpi ulnaris muscle  was also released.  Blunt dissection was used to split the FCU muscle belly and mild this fibers.  Care was taken to ensure that no ulnar nerve fascicles to the FCU muscle bellies were injured.  With the nerve released distally, I then traced the nerve proximally.  The fascia overlying the ulnar nerve was released proximally to the level of the arcade of Struthers.  There were no points of compression along the length of the nerve.  The elbow was taken through range of motion from extension to full flexion with no subluxation of the nerve over the epicondyle.  There was no need for transposition procedure. ? ?I then turned my attention to the carpal tunnel release. A longitudinal incision was made in line with the radial border of the ring finger from distal to the wrist flexion crease to the intersection of Kaplan's cardinal line.  The skin and subcutaneous tissue was sharply divided.  The longitudinally running palmar fascia was incised.  The thenar musculature was bluntly swept off of the transverse carpal ligament.  The ligament was divided from proximal to distal until the fat surrounding the palmar arch was encountered. Retractors were then placed in the proximal aspect of the wound to visualize the distal antebrachial fascia.  The fascia was sharply divided under direct visualization.   The wound was then thoroughly irrigated with sterile saline.   ? ?The tourniquet was deflated.  Hemostasis was achieved at both surgical sites with direct pressure and bipolar electrocautery.  The carpal tunnel wound was then closed with 4-0 nylon sutures in a horizontal mattress fashion.  The cubital tunnel incision was closed with 4-0 Monocryl suture  in a buried erupted fashion followed by 4-0 nylon horizontal mattress suture.  Both wounds were closed with Xeroform, folded Kerlix, cast padding, and an Ace wrap. ? ?The patient was then reversed from anesthesia and transferred to the postoperative bed.  All counts were correct  x 2 at the end of the procedure.  The patient was taken to the recovery unit in stable condition.    ? ? ? ?POSTOPERATIVE PLAN: She will be discharged home with appropriate pain medication and discharge instructions.  See her back in 10 to 14 days for her first postoperative visit. ? ?Audria Nine, MD ?11:27 AM  ?

## 2021-05-05 NOTE — Anesthesia Postprocedure Evaluation (Signed)
Anesthesia Post Note ? ?Patient: Rachel Fischer ? ?Procedure(s) Performed: RIGHT CUBITAL TUNNEL RELEASE VS CARPAL TUNNEL RELEASE (Right: Arm Lower) ? ?  ? ?Patient location during evaluation: PACU ?Anesthesia Type: General ?Level of consciousness: awake ?Pain management: pain level controlled ?Vital Signs Assessment: post-procedure vital signs reviewed and stable ?Respiratory status: spontaneous breathing, nonlabored ventilation, respiratory function stable and patient connected to nasal cannula oxygen ?Cardiovascular status: blood pressure returned to baseline and stable ?Postop Assessment: no apparent nausea or vomiting ?Anesthetic complications: no ? ? ?No notable events documented. ? ?Last Vitals:  ?Vitals:  ? 05/05/21 1213 05/05/21 1220  ?BP:  (!) 149/86  ?Pulse: 78 79  ?Resp: 12 17  ?Temp:  36.6 ?C  ?SpO2: 98% 96%  ?  ?Last Pain:  ?Vitals:  ? 05/05/21 1220  ?TempSrc: Oral  ?PainSc: 4   ? ? ?  ?  ?  ?  ?  ?  ? ?[name] ? ? ? ? ?

## 2021-05-05 NOTE — Anesthesia Procedure Notes (Signed)
Procedure Name: LMA Insertion ?Date/Time: 05/05/2021 10:25 AM ?Performed by: Thornell Mule, CRNA ?Pre-anesthesia Checklist: Patient identified, Emergency Drugs available, Suction available and Patient being monitored ?Patient Re-evaluated:Patient Re-evaluated prior to induction ?Oxygen Delivery Method: Circle system utilized ?Preoxygenation: Pre-oxygenation with 100% oxygen ?Induction Type: IV induction ?LMA: LMA inserted ?LMA Size: 4.0 ?Number of attempts: 1 ?Placement Confirmation: positive ETCO2 ?Tube secured with: Tape ?Dental Injury: Teeth and Oropharynx as per pre-operative assessment  ? ? ? ? ?

## 2021-05-05 NOTE — Anesthesia Preprocedure Evaluation (Addendum)
Anesthesia Evaluation  ?Patient identified by MRN, date of birth, ID band ?Patient awake ? ? ? ?Reviewed: ?Allergy & Precautions, NPO status , Patient's Chart, lab work & pertinent test results ? ?Airway ?Mallampati: II ? ?TM Distance: >3 FB ?Neck ROM: Full ? ? ? Dental ?no notable dental hx. ? ?  ?Pulmonary ?Current SmokerPatient did not abstain from smoking.,  ?  ?Pulmonary exam normal ?breath sounds clear to auscultation ? ? ? ? ? ? Cardiovascular ?hypertension, Pt. on medications ?Normal cardiovascular exam ?Rhythm:Regular Rate:Normal ? ?ECG: SR, rate 87 ? ?ECHO: ?Left ventricular ejection fraction, by estimation, is 60 to 65%. The left ventricle has normal function. The left ventricle has no regional wall motion abnormalities. Left ?ventricular diastolic parameters are consistent with Grade I diastolic dysfunction (impaired relaxation). ?Right ventricular systolic function is normal. The right ventricular size is normal. ?There is normal pulmonary artery systolic pressure. The estimated right ventricular ?systolic pressure is 18.7 mmHg. ?The mitral valve is normal in structure. Trivial mitral valve regurgitation. No evidence of mitral stenosis. ?The aortic valve is tricuspid. Aortic valve regurgitation is not visualized. No aortic stenosis is present. ?The inferior vena cava is normal in size with greater than 50% respiratory variability, suggesting right atrial pressure of 3 mmHg. ?  ?Neuro/Psych ? Headaches, PSYCHIATRIC DISORDERS Depression  Neuromuscular disease   ? GI/Hepatic ?negative GI ROS, (+)  ?  ? substance abuse ? marijuana use,   ?Endo/Other  ?negative endocrine ROS ? Renal/GU ?negative Renal ROS  ? ?  ?Musculoskeletal ?negative musculoskeletal ROS ?(+)  ? Abdominal ?  ?Peds ? Hematology ?negative hematology ROS ?(+)   ?Anesthesia Other Findings ?RIGHT CARPAL TUNNEL VS CUBITAL TUNNEL SYNDROME ? Reproductive/Obstetrics ?hcg negative ? ?  ? ? ? ? ? ? ? ? ? ? ? ? ? ?   ?  ? ? ? ? ? ? ? ?Anesthesia Physical ?Anesthesia Plan ? ?ASA: 2 ? ?Anesthesia Plan: General  ? ?Post-op Pain Management:   ? ?Induction: Intravenous ? ?PONV Risk Score and Plan: 2 and Ondansetron, Dexamethasone, Midazolam and Treatment may vary due to age or medical condition ? ?Airway Management Planned: LMA ? ?Additional Equipment:  ? ?Intra-op Plan:  ? ?Post-operative Plan: Extubation in OR ? ?Informed Consent: I have reviewed the patients History and Physical, chart, labs and discussed the procedure including the risks, benefits and alternatives for the proposed anesthesia with the patient or authorized representative who has indicated his/her understanding and acceptance.  ? ? ? ?Dental advisory given ? ?Plan Discussed with: CRNA ? ?Anesthesia Plan Comments:   ? ? ? ? ? ? ?Anesthesia Quick Evaluation ? ?

## 2021-05-05 NOTE — Transfer of Care (Signed)
Immediate Anesthesia Transfer of Care Note ? ?Patient: Rachel Fischer ? ?Procedure(s) Performed: RIGHT CUBITAL TUNNEL RELEASE VS CARPAL TUNNEL RELEASE (Right: Arm Lower) ? ?Patient Location: PACU ? ?Anesthesia Type:General ? ?Level of Consciousness: drowsy, patient cooperative and responds to stimulation ? ?Airway & Oxygen Therapy: Patient Spontanous Breathing and Patient connected to face mask oxygen ? ?Post-op Assessment: Report given to RN and Post -op Vital signs reviewed and stable ? ?Post vital signs: Reviewed and stable ? ?Last Vitals:  ?Vitals Value Taken Time  ?BP    ?Temp    ?Pulse 72 05/05/21 1124  ?Resp    ?SpO2 100 % 05/05/21 1124  ?Vitals shown include unvalidated device data. ? ?Last Pain:  ?Vitals:  ? 05/05/21 0851  ?TempSrc: Oral  ?PainSc: 0-No pain  ?   ? ?Patients Stated Pain Goal: 4 (05/05/21 3500) ? ?Complications: No notable events documented. ?

## 2021-05-06 ENCOUNTER — Encounter (HOSPITAL_BASED_OUTPATIENT_CLINIC_OR_DEPARTMENT_OTHER): Payer: Self-pay | Admitting: Orthopedic Surgery

## 2021-05-17 ENCOUNTER — Ambulatory Visit (INDEPENDENT_AMBULATORY_CARE_PROVIDER_SITE_OTHER): Payer: 59 | Admitting: Orthopedic Surgery

## 2021-05-17 ENCOUNTER — Encounter: Payer: Self-pay | Admitting: Orthopedic Surgery

## 2021-05-17 ENCOUNTER — Other Ambulatory Visit: Payer: Self-pay

## 2021-05-17 DIAGNOSIS — G5601 Carpal tunnel syndrome, right upper limb: Secondary | ICD-10-CM

## 2021-05-17 DIAGNOSIS — G5621 Lesion of ulnar nerve, right upper limb: Secondary | ICD-10-CM

## 2021-05-17 NOTE — Progress Notes (Signed)
? ?Office Visit Note ?  ?Patient: Rachel Fischer           ?Date of Birth: 1970/09/20           ?MRN: [id number] ?Visit Date: 05/17/2021 ?             ?Requested by: Norm Salt, PA ?[id number] ?[place],  [place] [address] ?PCP: Norm Salt, PA ? ? ?Assessment & Plan: ?Visit Diagnoses:  ?1. Carpal tunnel syndrome, right   ?2. Cubital tunnel syndrome, right   ? ? ?Plan: Patient is just under two weeks s/p right CTR and CuTR.  She is doing well postoperatively.  Her incisions are clean and dry without surrounding erythema and induration.  Sutures removed.  Her median nerve symptoms have resolved.  She still has mild numbness in her ring and small fingers but this is improved from before surgery.  She has some mild right hand stiffness but able to make full fist and has intact thenar motor function.  I can see her back in two weeks.  ? ?Follow-Up Instructions: No follow-ups on file.  ? ?Orders:  ?No orders of the defined types were placed in this encounter. ? ?No orders of the defined types were placed in this encounter. ? ? ? ? Procedures: ?No procedures performed ? ? ?Clinical Data: ?No additional findings. ? ? ?Subjective: ?Chief Complaint  ?Patient presents with  ? Right Wrist - Routine Post Op, Pain  ? Right Elbow - Routine Post Op, Pain  ? ? ?HPI ? ?Review of Systems ? ? ?Objective: ?Vital Signs: There were no vitals taken for this visit. ? ?Physical Exam ? ?Ortho Exam ? ?Specialty Comments:  ?No specialty comments available. ? ?Imaging: ?No results found. ? ? ?PMFS History: ?Patient Active Problem List  ? Diagnosis Date Noted  ? Hypertensive urgency 02/28/2021  ? Right wrist tendinitis 01/04/2021  ? Cubital tunnel syndrome, right 01/04/2021  ? Carpal tunnel syndrome, right 06/27/2018  ? Pain and swelling of right wrist 06/27/2018  ? Smoker 06/27/2018  ? Decreased thyroid stimulating hormone (TSH) level 06/27/2018  ? Ovarian cyst 03/17/2016  ? Family history of breast cancer 03/17/2016  ? Blurred  vision   ? Cephalalgia   ? Vision changes   ? Secondary hypertension, unspecified   ? Headache 08/06/2015  ? Pulmonary nodule 06/07/2011  ? ?Past Medical History:  ?Diagnosis Date  ? Anemia   ? Colon polyps   ? Decreased thyroid stimulating hormone (TSH) level   ? Depression   ? has not been diagnosed  ? Hx of trichomoniasis   ? Hyperlipidemia   ? Hypertension   ? not currently taking meds due to financial reasons  ? Lung nodule   ? Migraine headache   ? migraines; gets them 4/month  ? Tobacco use   ? Vitamin D deficiency   ?  ?Family History  ?Problem Relation Age of Onset  ? Breast cancer Mother 79  ? Hypertension Mother   ? Hyperlipidemia Mother   ? CAD Mother   ? Hypertension Father   ?  ?Past Surgical History:  ?Procedure Laterality Date  ? ABDOMINAL HYSTERECTOMY    ? ARM NEUROPLASTY Left   ? c section x 4    ? CESAREAN SECTION    ? FOOT SURGERY    ? ULNAR TUNNEL RELEASE Right 05/05/2021  ? Procedure: RIGHT CUBITAL TUNNEL RELEASE VS CARPAL TUNNEL RELEASE;  Surgeon: Marlyne Beards, MD;  [facility]:  [facility];  Service:  Orthopedics;  Laterality: Right;  ? ?Social History  ? ?Occupational History  ? Occupation: unemployed  ?  Employer: UNEMPLOYED  ?Tobacco Use  ? Smoking status: Every Day  ?  Packs/day: 0.50  ?  Years: 19.00  ?  Pack years: 9.50  ?  Types: Cigarettes  ? Smokeless tobacco: Never  ?Vaping Use  ? Vaping Use: Some days  ?Substance and Sexual Activity  ? Alcohol use: No  ? Drug use: Yes  ?  Types: Marijuana  ?  Comment: daily use  ? Sexual activity: Yes  ?  Birth control/protection: Surgical  ? ? ? ? ? ? ?

## 2021-05-19 ENCOUNTER — Other Ambulatory Visit: Payer: Self-pay | Admitting: Critical Care Medicine

## 2021-05-19 DIAGNOSIS — I10 Essential (primary) hypertension: Secondary | ICD-10-CM

## 2021-05-25 ENCOUNTER — Ambulatory Visit (INDEPENDENT_AMBULATORY_CARE_PROVIDER_SITE_OTHER): Payer: 59 | Admitting: Orthopedic Surgery

## 2021-05-25 DIAGNOSIS — G5601 Carpal tunnel syndrome, right upper limb: Secondary | ICD-10-CM

## 2021-05-25 DIAGNOSIS — G5621 Lesion of ulnar nerve, right upper limb: Secondary | ICD-10-CM

## 2021-05-25 NOTE — Progress Notes (Signed)
? ?Post-Op Visit Note ?  ?Patient: Rachel Fischer           ?Date of Birth: January 10, 1971           ?MRN: [id number] ?Visit Date: 05/25/2021 ?PCP: Norm Salt, PA ? ? ?Assessment & Plan: ? ?Chief Complaint:  ?Chief Complaint  ?Patient presents with  ? Right Wrist - Routine Post Op  ?  Having pain along the thumb and at the wrist  ? Right Elbow - Routine Post Op  ?  Doing great  ? ?Visit Diagnoses:  ?1. Carpal tunnel syndrome, right   ?2. Cubital tunnel syndrome, right   ? ? ?Plan: Patient is now 3 weeks s/p R in-situ cubital tunnel release and right carpal tunnel release.  Her numbness in the median and ulnar distributions have resolved.  She was previously having some residual numbness in the ring and small fingers but this has resolved. She does describe some mild "pillar" type of pain in the thenar eminence but discussed that this should continue to improve.  She has 5/5 thenar motor strength.  She can follow up with me again in another month if she's still having trouble.  ? ?Follow-Up Instructions: No follow-ups on file.  ? ?Orders:  ?No orders of the defined types were placed in this encounter. ? ?No orders of the defined types were placed in this encounter. ? ? ?Imaging: ?No results found. ? ?PMFS History: ?Patient Active Problem List  ? Diagnosis Date Noted  ? Hypertensive urgency 02/28/2021  ? Right wrist tendinitis 01/04/2021  ? Cubital tunnel syndrome, right 01/04/2021  ? Carpal tunnel syndrome, right 06/27/2018  ? Pain and swelling of right wrist 06/27/2018  ? Smoker 06/27/2018  ? Decreased thyroid stimulating hormone (TSH) level 06/27/2018  ? Ovarian cyst 03/17/2016  ? Family history of breast cancer 03/17/2016  ? Blurred vision   ? Cephalalgia   ? Vision changes   ? Secondary hypertension, unspecified   ? Headache 08/06/2015  ? Pulmonary nodule 06/07/2011  ? ?Past Medical History:  ?Diagnosis Date  ? Anemia   ? Colon polyps   ? Decreased thyroid stimulating hormone (TSH) level   ? Depression   ?  has not been diagnosed  ? Hx of trichomoniasis   ? Hyperlipidemia   ? Hypertension   ? not currently taking meds due to financial reasons  ? Lung nodule   ? Migraine headache   ? migraines; gets them 4/month  ? Tobacco use   ? Vitamin D deficiency   ?  ?Family History  ?Problem Relation Age of Onset  ? Breast cancer Mother 23  ? Hypertension Mother   ? Hyperlipidemia Mother   ? CAD Mother   ? Hypertension Father   ?  ?Past Surgical History:  ?Procedure Laterality Date  ? ABDOMINAL HYSTERECTOMY    ? ARM NEUROPLASTY Left   ? c section x 4    ? CESAREAN SECTION    ? FOOT SURGERY    ? ULNAR TUNNEL RELEASE Right 05/05/2021  ? Procedure: RIGHT CUBITAL TUNNEL RELEASE VS CARPAL TUNNEL RELEASE;  Surgeon: Marlyne Beards, MD;  Location: [facility];  Service: Orthopedics;  Laterality: Right;  ? ?Social History  ? ?Occupational History  ? Occupation: unemployed  ?  Employer: UNEMPLOYED  ?Tobacco Use  ? Smoking status: Every Day  ?  Packs/day: 0.50  ?  Years: 19.00  ?  Pack years: 9.50  ?  Types: Cigarettes  ? Smokeless tobacco: Never  ?  Vaping Use  ? Vaping Use: Some days  ?Substance and Sexual Activity  ? Alcohol use: No  ? Drug use: Yes  ?  Types: Marijuana  ?  Comment: daily use  ? Sexual activity: Yes  ?  Birth control/protection: Surgical  ? ? ? ?

## 2021-10-12 ENCOUNTER — Ambulatory Visit (HOSPITAL_COMMUNITY)
Admission: EM | Admit: 2021-10-12 | Discharge: 2021-10-12 | Disposition: A | Discharge status: Home / Self Care | Payer: Commercial Managed Care - HMO | Attending: Family Medicine | Admitting: Family Medicine

## 2021-10-12 ENCOUNTER — Emergency Department (HOSPITAL_COMMUNITY)
Admission: EM | Admit: 2021-10-12 | Discharge: 2021-10-13 | Disposition: A | Discharge status: Home / Self Care | Payer: Commercial Managed Care - HMO | Attending: Emergency Medicine | Admitting: Emergency Medicine

## 2021-10-12 ENCOUNTER — Encounter (HOSPITAL_COMMUNITY): Payer: Self-pay | Admitting: Emergency Medicine

## 2021-10-12 ENCOUNTER — Ambulatory Visit (INDEPENDENT_AMBULATORY_CARE_PROVIDER_SITE_OTHER): Payer: Commercial Managed Care - HMO

## 2021-10-12 DIAGNOSIS — I10 Essential (primary) hypertension: Secondary | ICD-10-CM

## 2021-10-12 DIAGNOSIS — R0602 Shortness of breath: Secondary | ICD-10-CM | POA: Diagnosis not present

## 2021-10-12 DIAGNOSIS — R079 Chest pain, unspecified: Secondary | ICD-10-CM | POA: Diagnosis not present

## 2021-10-12 DIAGNOSIS — Z79899 Other long term (current) drug therapy: Secondary | ICD-10-CM | POA: Insufficient documentation

## 2021-10-12 DIAGNOSIS — M545 Low back pain, unspecified: Secondary | ICD-10-CM | POA: Insufficient documentation

## 2021-10-12 DIAGNOSIS — M546 Pain in thoracic spine: Secondary | ICD-10-CM | POA: Diagnosis not present

## 2021-10-12 DIAGNOSIS — R0789 Other chest pain: Secondary | ICD-10-CM | POA: Diagnosis not present

## 2021-10-12 DIAGNOSIS — F17210 Nicotine dependence, cigarettes, uncomplicated: Secondary | ICD-10-CM | POA: Insufficient documentation

## 2021-10-12 LAB — BASIC METABOLIC PANEL
Anion gap: 12 (ref 5–15)
BUN: 5 mg/dL — ABNORMAL LOW (ref 6–20)
CO2: 25 mmol/L (ref 22–32)
Calcium: 9.6 mg/dL (ref 8.9–10.3)
Chloride: 105 mmol/L (ref 98–111)
Creatinine, Ser: 0.78 mg/dL (ref 0.44–1.00)
GFR, Estimated: >60 mL/min (ref >60)
Glucose, Bld: 104 mg/dL — ABNORMAL HIGH (ref 70–99)
Potassium: 3.3 mmol/L — ABNORMAL LOW (ref 3.5–5.1)
Sodium: 142 mmol/L (ref 135–145)

## 2021-10-12 LAB — CBC WITH DIFFERENTIAL/PLATELET
Abs Immature Granulocytes: 0.01 10*3/uL (ref 0.00–0.07)
Basophils Absolute: 0 10*3/uL (ref 0.0–0.1)
Basophils Relative: 1 %
Eosinophils Absolute: 0.1 10*3/uL (ref 0.0–0.5)
Eosinophils Relative: 1 %
HCT: 38.9 % (ref 36.0–46.0)
Hemoglobin: 12.8 g/dL (ref 12.0–15.0)
Immature Granulocytes: 0 %
Lymphocytes Relative: 47 %
Lymphs Abs: 3 10*3/uL (ref 0.7–4.0)
MCH: 27.7 pg (ref 26.0–34.0)
MCHC: 32.9 g/dL (ref 30.0–36.0)
MCV: 84.2 fL (ref 80.0–100.0)
Monocytes Absolute: 0.4 10*3/uL (ref 0.1–1.0)
Monocytes Relative: 6 %
Neutro Abs: 2.8 10*3/uL (ref 1.7–7.7)
Neutrophils Relative %: 45 %
Platelets: 358 10*3/uL (ref 150–400)
RBC: 4.62 MIL/uL (ref 3.87–5.11)
RDW: 13.1 % (ref 11.5–15.5)
WBC: 6.2 10*3/uL (ref 4.0–10.5)
nRBC: 0 % (ref 0.0–0.2)

## 2021-10-12 LAB — TROPONIN I (HIGH SENSITIVITY): Troponin I (High Sensitivity): 6 ng/L (ref <18)

## 2021-10-12 NOTE — ED Notes (Signed)
X1 no response for vitals recheck 

## 2021-10-12 NOTE — ED Provider Triage Note (Signed)
Emergency Medicine Provider Triage Evaluation Note  Rachel Fischer , a 51 y.o. female  was evaluated in triage.  Pt complains of upper back pain that radiates to chest with movement.  Patient evaluated at urgent care prior to arrival and sent to the ED for further evaluation.  BP elevated in the 200s.  No history of blood clots.  Denies shortness of breath.  Review of Systems  Positive: CP, back pain Negative: fever  Physical Exam  BP (!) 201/103 (BP Location: Right Arm)   Pulse 93   Temp 98.5 F (36.9 C) (Oral)   Resp 16   SpO2 100%  Gen:   Awake, no distress   Resp:  Normal effort  MSK:   Moves extremities without difficulty  Other:    Medical Decision Making  Medically screening exam initiated at 7:00 PM.  Appropriate orders placed.  Rachel Fischer was informed that the remainder of the evaluation will be completed by another provider, this initial triage assessment does not replace that evaluation, and the importance of remaining in the ED until their evaluation is complete.  Cardiac labs CXR obtained at [facility]   Rachel Fischer 10/12/21 1901

## 2021-10-12 NOTE — ED Triage Notes (Signed)
For 3 days patient having back pains under shoulder blade that radiates through to chest. Reports worse at work when reaching and picking things. Reports had muscle relaxer from previous and tried taking.

## 2021-10-12 NOTE — ED Notes (Signed)
Patient is being discharged from the Urgent Care and sent to the Emergency Department via pov . Per Dr Tracie Harrier, patient is in need of higher level of care due to uncontrolled hypertension, back pain. Patient is aware and verbalizes understanding of plan of care.  Vitals:   10/12/21 1508 10/12/21 1626  BP: (!) 192/119 (!) 185/111  Pulse: 95 76  Resp: 19   Temp: 98.3 F (36.8 C)   SpO2: 99% 98%

## 2021-10-12 NOTE — ED Triage Notes (Signed)
Pt c/o back pain radiating through to center chest. Worse w movement. Pt states muscle relaxers do not help. Seen at [facility] for same, sent to ED for further eval.

## 2021-10-12 NOTE — ED Notes (Signed)
X1 no response vitals recheck

## 2021-10-12 NOTE — ED Provider Notes (Signed)
[facility]   [id number] 10/12/21 Arrival Time: 1427  ASSESSMENT & PLAN:  1. Chest pain, unspecified type   2. Acute midline thoracic back pain   3. Elevated blood pressure reading in office with diagnosis of hypertension   4. SOB (shortness of breath)    Cannot r/o cardiac etiology in setting of current symptoms and elevated BP; discussed. To ED for further evaluation. Stable upon d/c; elects private vehicle.  ECG: Performed today and interpreted by me: normal EKG, normal sinus rhythm. No STEMI.  I have personally viewed the imaging studies ordered this visit. Normal CXR. No pneumothorax.  Reviewed expectations re: course of current medical issues. Questions answered. Outlined signs and symptoms indicating need for more acute intervention. Patient verbalized understanding. After Visit Summary given.   SUBJECTIVE:  History from: patient. Rachel Fischer is a 51 y.o. female who presents with complaint of persistent midline thoracic back pain that occas radiates to R shoulder blade; x 3 days; gradual onset. No abd pain. he feels this may be related to lifting items at work since she was mainly feeling pain with certain torso movements. Now reports pain at rest with assoc SOB at times. Previously prescribed muscle relaxer taken without any relief. Pain has prevented sleep at times. Without assoc n/v/diaphoresis. Denies: fatigue, irregular heart beat, lower extremity edema, near-syncope, orthopnea, palpitations, and paroxysmal nocturnal dyspnea. Afebrile. No cough. No recent illnesses. Ambulatory without assistance. Daily smoker; cigarettes and THC. Denies recreational drug use. No heavy alcohol use. No recent prolonged immobility.  Social History   Tobacco Use  Smoking Status Every Day   Packs/day: 0.50   Years: 19.00   Total pack years: 9.50   Types: Cigarettes  Smokeless Tobacco Never     OBJECTIVE:  Vitals:   10/12/21 1508 10/12/21 1626  BP: (!)  192/119 (!) 185/111  Pulse: 95 76  Resp: 19   Temp: 98.3 F (36.8 C)   TempSrc: Oral   SpO2: 99% 98%    General appearance: alert, oriented, no acute distress Eyes: PERRLA; EOMI; conjunctivae normal HENT: normocephalic; atraumatic Neck: supple with FROM Lungs: without labored respirations; speaks full sentences without difficulty; CTAB Heart: regular rate and rhythm Chest Wall: without specific tenderness to palpation; does reports pain in anterior chest with certain movements Abdomen: soft, non-tender; no guarding or rebound tenderness Extremities: without edema; without calf swelling or tenderness; symmetrical without gross deformities Skin: warm and dry; with rash or lesions Neuro: normal gait Psychological: alert and cooperative; normal mood and affect   Imaging: DG Chest 2 View  Result Date: 10/12/2021 CLINICAL DATA:  Chest pain.  Back pain radiating to the chest. EXAM: CHEST - 2 VIEW COMPARISON:  Radiograph and CTA 02/28/2021 FINDINGS: The cardiomediastinal contours are normal. The lungs are clear. Pulmonary vasculature is normal. No consolidation, pleural effusion, or pneumothorax. No acute osseous abnormalities are seen. IMPRESSION: Negative radiographs of the chest. Electronically Signed   By: Narda Rutherford M.D.   On: 10/12/2021 16:16     Allergies  Allergen Reactions   Ciprofloxacin Other (See Comments)    Generalized muscle and joint pain.   Morphine And Related Itching and Nausea And Vomiting   Penicillins Hives    Has patient had a PCN reaction causing immediate rash, facial/tongue/throat swelling, SOB or lightheadedness with hypotension: No Has patient had a PCN reaction causing severe rash involving mucus membranes or skin necrosis: No Has patient had a PCN reaction that required hospitalization No Has patient had a PCN reaction occurring  within the last 10 years: No If all of the above answers are "NO", then may proceed with Cephalosporin use.     Past  Medical History:  Diagnosis Date   Anemia    Colon polyps    Decreased thyroid stimulating hormone (TSH) level    Depression    has not been diagnosed   Hx of trichomoniasis    Hyperlipidemia    Hypertension    not currently taking meds due to financial reasons   Lung nodule    Migraine headache    migraines; gets them 4/month   Tobacco use    Vitamin D deficiency    Social History   Socioeconomic History   Marital status: Single    Spouse name: Not on file   Number of children: 4   Years of education: Not on file   Highest education level: Not on file  Occupational History   Occupation: unemployed    Employer: UNEMPLOYED  Tobacco Use   Smoking status: Every Day    Packs/day: 0.50    Years: 19.00    Total pack years: 9.50    Types: Cigarettes   Smokeless tobacco: Never  Vaping Use   Vaping Use: Some days  Substance and Sexual Activity   Alcohol use: No   Drug use: Yes    Types: Marijuana    Comment: daily use   Sexual activity: Yes    Birth control/protection: Surgical  Other Topics Concern   Not on file  Social History Narrative   Children: 3 boys and 1 girl.    Right Handed    Lives in a two story home    Social Determinants of Health   Financial Resource Strain: Not on file  Food Insecurity: Not on file  Transportation Needs: Not on file  Physical Activity: Not on file  Stress: Not on file  Social Connections: Not on file  Intimate Partner Violence: Not on file   Family History  Problem Relation Age of Onset   Breast cancer Mother 88   Hypertension Mother    Hyperlipidemia Mother    CAD Mother    Hypertension Father    Past Surgical History:  Procedure Laterality Date   ABDOMINAL HYSTERECTOMY     ARM NEUROPLASTY Left    c section x 4     CESAREAN SECTION     FOOT SURGERY     ULNAR TUNNEL RELEASE Right 05/05/2021   Procedure: RIGHT CUBITAL TUNNEL RELEASE VS CARPAL TUNNEL RELEASE;  Surgeon: Marlyne Beards, MD;  Location: [facility];  Service: Orthopedics;  Laterality: Right;      Mardella Layman, MD 10/12/21 [phone]

## 2021-10-13 ENCOUNTER — Emergency Department (HOSPITAL_COMMUNITY): Payer: Commercial Managed Care - HMO

## 2021-10-13 LAB — HEPATIC FUNCTION PANEL
ALT: 10 U/L (ref 0–44)
AST: 17 U/L (ref 15–41)
Albumin: 3.8 g/dL (ref 3.5–5.0)
Alkaline Phosphatase: 78 U/L (ref 38–126)
Bilirubin, Direct: <0.1 mg/dL (ref 0.0–0.2)
Total Bilirubin: <0.1 mg/dL — ABNORMAL LOW (ref 0.3–1.2)
Total Protein: 6.3 g/dL — ABNORMAL LOW (ref 6.5–8.1)

## 2021-10-13 LAB — TROPONIN I (HIGH SENSITIVITY): Troponin I (High Sensitivity): 5 ng/L (ref <18)

## 2021-10-13 LAB — LIPASE, BLOOD: Lipase: 28 U/L (ref 11–51)

## 2021-10-13 MED ORDER — AMLODIPINE BESYLATE 10 MG PO TABS: 10.0000 mg | ORAL_TABLET | Freq: Every day | ORAL | 0 refills | Status: AC

## 2021-10-13 MED ORDER — IOHEXOL 350 MG/ML SOLN
80.0000 mL | Freq: Once | INTRAVENOUS | Status: AC | PRN
  Administered 2021-10-13: 80 mL via INTRAVENOUS

## 2021-10-13 MED ORDER — LIDOCAINE 5 % EX PTCH
1.0000 | MEDICATED_PATCH | CUTANEOUS | Status: DC
  Administered 2021-10-13: 1 via TRANSDERMAL
  Filled 2021-10-13: qty 1

## 2021-10-13 MED ORDER — LOSARTAN POTASSIUM 50 MG PO TABS: 50.0000 mg | ORAL_TABLET | Freq: Every day | ORAL | 0 refills | Status: DC

## 2021-10-13 MED ORDER — HYDROCODONE-ACETAMINOPHEN 5-325 MG PO TABS
1.0000 | ORAL_TABLET | Freq: Once | ORAL | Status: AC
  Administered 2021-10-13: 1 via ORAL
  Filled 2021-10-13: qty 1

## 2021-10-13 MED ORDER — LABETALOL HCL 5 MG/ML IV SOLN
10.0000 mg | Freq: Once | INTRAVENOUS | Status: DC
  Filled 2021-10-13: qty 4

## 2021-10-13 MED ORDER — KETOROLAC TROMETHAMINE 15 MG/ML IJ SOLN
15.0000 mg | Freq: Once | INTRAMUSCULAR | Status: AC
  Administered 2021-10-13: 15 mg via INTRAVENOUS
  Filled 2021-10-13: qty 1

## 2021-10-13 MED ORDER — CYCLOBENZAPRINE HCL 10 MG PO TABS
10.0000 mg | ORAL_TABLET | Freq: Once | ORAL | Status: AC
  Administered 2021-10-13: 10 mg via ORAL
  Filled 2021-10-13: qty 1

## 2021-10-13 NOTE — ED Provider Notes (Signed)
[facility] EMERGENCY DEPARTMENT Provider Note   CSN: [id number] Arrival date & time: 10/12/21  1806     History  Chief Complaint  Patient presents with   Back Pain   Chest Pain    Rachel Fischer is a 51 y.o. female.  The history is provided by the patient and medical records.  Back Pain Associated symptoms: chest pain   Chest Pain Associated symptoms: back pain   Rachel Fischer is a 51 y.o. female who presents to the Emergency Department complaining of back pain.  She complains of 3 days of right-sided thoracic back pain that shoots to her chest.  Pain is worse when she lies on her side and uses her right arm.  No associated fever, cough, shortness of breath, abdominal pain, nausea, vomiting, leg swelling or pain.  She has been out of her losartan for the last week but continues to take her amlodipine as prescribed.  She does use tobacco.  No alcohol or drug use.  She was evaluated at urgent care earlier today and referred to the emergency department for further evaluation. Home Medications Prior to Admission medications   Medication Sig Start Date End Date Taking? Authorizing Provider  amLODipine (NORVASC) 10 MG tablet Take 1 tablet (10 mg total) by mouth daily. 10/13/21   Tilden Fossa, MD  cyclobenzaprine (FLEXERIL) 5 MG tablet Take 5 mg by mouth 3 (three) times daily as needed for muscle spasms.    [provider]  losartan (COZAAR) 50 MG tablet Take 1 tablet (50 mg total) by mouth daily. 10/13/21   Tilden Fossa, MD      Allergies    Ciprofloxacin, Morphine and related, and Penicillins    Review of Systems   Review of Systems  Cardiovascular:  Positive for chest pain.  Musculoskeletal:  Positive for back pain.  All other systems reviewed and are negative.   Physical Exam Updated Vital Signs BP 139/81   Pulse 78   Temp 97.8 F (36.6 C) (Oral)   Resp 13   SpO2 100%  Physical Exam Vitals and nursing note reviewed.   Constitutional:      Appearance: She is well-developed.  HENT:     Head: Normocephalic and atraumatic.  Cardiovascular:     Rate and Rhythm: Normal rate and regular rhythm.     Heart sounds: No murmur heard. Pulmonary:     Effort: Pulmonary effort is normal. No respiratory distress.     Breath sounds: Normal breath sounds.     Comments: There is pain referred to the chest wall when she moves her right arm and sits up but there is no pain on palpation. Chest:     Chest wall: No tenderness.  Abdominal:     Palpations: Abdomen is soft.     Tenderness: There is no abdominal tenderness. There is no guarding or rebound.  Musculoskeletal:        General: No swelling or tenderness.     Comments: 2+ radial and DP pulses bilaterally.  Skin:    General: Skin is warm and dry.  Neurological:     Mental Status: She is alert and oriented to person, place, and time.     Comments: 5 out of 5 strength in all 4 extremities.  Psychiatric:        Behavior: Behavior normal.     ED Results / Procedures / Treatments   Labs (all labs ordered are listed, but only abnormal results are displayed) Labs Reviewed  BASIC METABOLIC PANEL - Abnormal; Notable for the following components:      Result Value   Potassium 3.3 (*)    Glucose, Bld 104 (*)    BUN 5 (*)    All other components within normal limits  HEPATIC FUNCTION PANEL - Abnormal; Notable for the following components:   Total Protein 6.3 (*)    Total Bilirubin <0.1 (*)    All other components within normal limits  CBC WITH DIFFERENTIAL/PLATELET  LIPASE, BLOOD  TROPONIN I (HIGH SENSITIVITY)  TROPONIN I (HIGH SENSITIVITY)    EKG EKG Interpretation  Date/Time:  Tuesday October 12 2021 18:44:29 EDT Ventricular Rate:  93 PR Interval:  164 QRS Duration: 72 QT Interval:  368 QTC Calculation: 457 R Axis:   19 Text Interpretation: Normal sinus rhythm Normal ECG Confirmed by Quintella Reichert [phone]) on 10/13/2021 12:51:45 AM  Radiology CT  Angio Chest/Abd/Pel for Dissection W and/or W/WO  Result Date: 10/13/2021 CLINICAL DATA:  Acute aortic syndrome (AAS) suspected. Central chest pain, back pain EXAM: CT ANGIOGRAPHY CHEST, ABDOMEN AND PELVIS TECHNIQUE: Non-contrast CT of the chest was initially obtained. Multidetector CT imaging through the chest, abdomen and pelvis was performed using the standard protocol during bolus administration of intravenous contrast. Multiplanar reconstructed images and MIPs were obtained and reviewed to evaluate the vascular anatomy. RADIATION DOSE REDUCTION: This exam was performed according to the departmental dose-optimization program which includes automated exposure control, adjustment of the mA and/or kV according to patient size and/or use of iterative reconstruction technique. CONTRAST:  40mL OMNIPAQUE IOHEXOL 350 MG/ML SOLN COMPARISON:  None Available. FINDINGS: CTA CHEST FINDINGS Cardiovascular: Preferential opacification of the thoracic aorta. No evidence of thoracic aortic aneurysm or dissection. Normal heart size. No pericardial effusion. Mediastinum/Nodes: No enlarged mediastinal, hilar, or axillary lymph nodes. Thyroid gland, trachea, and esophagus demonstrate no significant findings. Lungs/Pleura: Mild bibasilar dependent atelectasis. Stable 3 mm pulmonary nodule within the left lower lobe, axial image # 87/8. Lungs are otherwise clear. No pneumothorax or pleural effusion. Central airways are widely patent. Musculoskeletal: No chest wall abnormality. No acute or significant osseous findings. Review of the MIP images confirms the above findings. CTA ABDOMEN AND PELVIS FINDINGS VASCULAR Aorta: Normal caliber aorta without aneurysm, dissection, vasculitis or significant stenosis. Mild mixed atherosclerotic plaque. Celiac: Fusiform dilation of the celiac axis proximally is again seen measuring 8 mm in diameter. Irregular appearance of the a left gastric artery proximally is unchanged may relate to  atherosclerotic disease or fibromuscular dysplasia. No aneurysm or dissection. SMA: Patent without evidence of aneurysm, dissection, vasculitis or significant stenosis. Renals: Dual left renal arteries and tiny accessory upper and lower polar right renal arteries are identified. Renal arteries are widely patent and demonstrate normal vascular morphology. No aneurysm or dissection. IMA: Diminutive, but widely patent.  No aneurysm or dissection. Inflow: Patent without evidence of aneurysm, dissection, vasculitis or significant stenosis. Internal iliac arteries are patent Veins: No obvious venous abnormality within the limitations of this arterial phase study. Review of the MIP images confirms the above findings. NON-VASCULAR Hepatobiliary: No focal liver abnormality is seen. No gallstones, gallbladder wall thickening, or biliary dilatation. Pancreas: Unremarkable Spleen: Unremarkable Adrenals/Urinary Tract: Adrenal glands are unremarkable. Kidneys are normal, without renal calculi, focal lesion, or hydronephrosis. Bladder is unremarkable. Stomach/Bowel: Stomach is within normal limits. Appendix appears normal. No evidence of bowel wall thickening, distention, or inflammatory changes. Lymphatic: No pathologic adenopathy within the abdomen and pelvis. Reproductive: Status post hysterectomy. No adnexal masses. Other: No abdominal wall hernia or  abnormality. No abdominopelvic ascites. Musculoskeletal: No acute bone abnormality. No lytic or blastic bone lesion. Review of the MIP images confirms the above findings. IMPRESSION: 1. No evidence of thoracoabdominal aortic aneurysm or dissection. 2. 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 3. Stable 3 mm pulmonary nodule within the left lower lobe, safely considered benign. 4. No acute intrathoracic or intra-abdominal pathology identified. No  definite radiographic explanation for the patient's reported symptoms. Aortic Atherosclerosis (ICD10-I70.0). Electronically Signed   By: Fidela Salisbury M.D.   On: 10/13/2021 02:15   DG Chest Port 1 View  Result Date: 10/13/2021 CLINICAL DATA:  Chest pain EXAM: PORTABLE CHEST 1 VIEW COMPARISON:  10/12/2021 FINDINGS: The heart size and mediastinal contours are within normal limits. Both lungs are clear. The visualized skeletal structures are unremarkable. IMPRESSION: No active disease. Electronically Signed   By: Rolm Baptise M.D.   On: 10/13/2021 01:31   DG Chest 2 View  Result Date: 10/12/2021 CLINICAL DATA:  Chest pain.  Back pain radiating to the chest. EXAM: CHEST - 2 VIEW COMPARISON:  Radiograph and CTA 02/28/2021 FINDINGS: The cardiomediastinal contours are normal. The lungs are clear. Pulmonary vasculature is normal. No consolidation, pleural effusion, or pneumothorax. No acute osseous abnormalities are seen. IMPRESSION: Negative radiographs of the chest. Electronically Signed   By: Keith Rake M.D.   On: 10/12/2021 16:16    Procedures Procedures    Medications Ordered in ED Medications  labetalol (NORMODYNE) injection 10 mg (10 mg Intravenous Not Given 10/13/21 0200)  lidocaine (LIDODERM) 5 % 1 patch (1 patch Transdermal Patch Applied 10/13/21 0157)  cyclobenzaprine (FLEXERIL) tablet 10 mg (10 mg Oral Given 10/13/21 0157)  HYDROcodone-acetaminophen (NORCO/VICODIN) 5-325 MG per tablet 1 tablet (1 tablet Oral Given 10/13/21 0157)  iohexol (OMNIPAQUE) 350 MG/ML injection 80 mL (80 mLs Intravenous Contrast Given 10/13/21 0154)  ketorolac (TORADOL) 15 MG/ML injection 15 mg (15 mg Intravenous Given 10/13/21 [id number])    ED Course/ Medical Decision Making/ A&P                           Medical Decision Making Amount and/or Complexity of Data Reviewed Labs: ordered. Radiology: ordered.  Risk Prescription drug management.   Patient with history of hypertension and tobacco use here for  evaluation of chest pain.  She is significantly hypertensive on evaluation in the emergency department with symmetric pulses.  She is in no acute distress.  EKG is without acute ischemic changes.  Troponins are negative x2.  Her pain is better after treatments in the emergency department.  A CTA was obtained given her symptoms without acute abnormality.  Suspect that her pain is musculoskeletal in nature.  Patient's blood pressure improved after pain control.  We will refill her home blood pressure medication.  Discussed findings of studies.  Discussed treatment for musculoskeletal pain.  Discussed outpatient follow-up and return precautions.  No evidence of dissection.  Presentation is not consistent with ACS, PE, hypertensive urgency.        Final Clinical Impression(s) / ED Diagnoses Final diagnoses:  Chest wall pain  Hypertension, unspecified type    Rx / DC Orders ED Discharge Orders          Ordered    amLODipine (NORVASC) 10 MG tablet  Daily        10/13/21 0405    losartan (COZAAR) 50 MG tablet  Daily        10/13/21 0405              Tilden Fossa, MD 10/13/21 [phone]

## 2021-10-13 NOTE — ED Notes (Signed)
Patient transported to CT 

## 2021-10-13 NOTE — Discharge Instructions (Addendum)
You had a CT scan performed in the emergency department that showed some changes to the arteries in your abdomen.  It is very important for you to continue your blood pressure medications as prescribed and follow-up with your family doctor for further evaluation.

## 2021-10-26 ENCOUNTER — Ambulatory Visit (HOSPITAL_COMMUNITY)
Admission: EM | Admit: 2021-10-26 | Discharge: 2021-10-26 | Disposition: A | Discharge status: Home / Self Care | Payer: Commercial Managed Care - HMO | Attending: Emergency Medicine | Admitting: Emergency Medicine

## 2021-10-26 ENCOUNTER — Encounter (HOSPITAL_COMMUNITY): Payer: Self-pay

## 2021-10-26 DIAGNOSIS — K047 Periapical abscess without sinus: Secondary | ICD-10-CM | POA: Diagnosis not present

## 2021-10-26 DIAGNOSIS — K122 Cellulitis and abscess of mouth: Secondary | ICD-10-CM

## 2021-10-26 MED ORDER — CEFDINIR 300 MG PO CAPS: 300.0000 mg | ORAL_CAPSULE | Freq: Two times a day (BID) | ORAL | 0 refills | Status: AC

## 2021-10-26 NOTE — ED Provider Notes (Signed)
[facility]    CSN: [id number] Arrival date & time: 10/26/21  1744      History   Chief Complaint Chief Complaint  Patient presents with   Facial Swelling    HPI Rachel Fischer is a 51 y.o. female.  Presents with 2 day history of facial pain and swelling Reports area of left lower jaw is painful to touch, feels warm Pain with chewing Denies fever, drainage, dental pain or tenderness  Has not tried any interventions   Past Medical History:  Diagnosis Date   Anemia    Colon polyps    Decreased thyroid stimulating hormone (TSH) level    Depression    has not been diagnosed   Hx of trichomoniasis    Hyperlipidemia    Hypertension    not currently taking meds due to financial reasons   Lung nodule    Migraine headache    migraines; gets them 4/month   Tobacco use    Vitamin D deficiency     Patient Active Problem List   Diagnosis Date Noted   Hypertensive urgency 02/28/2021   Right wrist tendinitis 01/04/2021   Cubital tunnel syndrome, right 01/04/2021   Carpal tunnel syndrome, right 06/27/2018   Pain and swelling of right wrist 06/27/2018   Smoker 06/27/2018   Decreased thyroid stimulating hormone (TSH) level 06/27/2018   Ovarian cyst 03/17/2016   Family history of breast cancer 03/17/2016   Blurred vision    Cephalalgia    Vision changes    Secondary hypertension, unspecified    Headache 08/06/2015   Pulmonary nodule 06/07/2011    Past Surgical History:  Procedure Laterality Date   ABDOMINAL HYSTERECTOMY     ARM NEUROPLASTY Left    c section x 4     CESAREAN SECTION     FOOT SURGERY     ULNAR TUNNEL RELEASE Right 05/05/2021   Procedure: RIGHT CUBITAL TUNNEL RELEASE VS CARPAL TUNNEL RELEASE;  Surgeon: Marlyne Beards, MD;  Location: [facility];  Service: Orthopedics;  Laterality: Right;    OB History     Gravida  4   Para  4   Term  4   Preterm      AB      Living  4      SAB      IAB      Ectopic       Multiple      Live Births  4            Home Medications    Prior to Admission medications   Medication Sig Start Date End Date Taking? Authorizing Provider  cefdinir (OMNICEF) 300 MG capsule Take 1 capsule (300 mg total) by mouth 2 (two) times daily for 5 days. 10/26/21 10/31/21 Yes Remas Sobel, Lurena Joiner, PA-C  amLODipine (NORVASC) 10 MG tablet Take 1 tablet (10 mg total) by mouth daily. 10/13/21   Tilden Fossa, MD  cyclobenzaprine (FLEXERIL) 5 MG tablet Take 5 mg by mouth 3 (three) times daily as needed for muscle spasms.    [provider]  losartan (COZAAR) 50 MG tablet Take 1 tablet (50 mg total) by mouth daily. 10/13/21   Tilden Fossa, MD    Family History Family History  Problem Relation Age of Onset   Breast cancer Mother 35   Hypertension Mother    Hyperlipidemia Mother    CAD Mother    Hypertension Father     Social History Social History   Tobacco Use  Smoking status: Every Day    Packs/day: 0.50    Years: 19.00    Total pack years: 9.50    Types: Cigarettes   Smokeless tobacco: Never  Vaping Use   Vaping Use: Some days  Substance Use Topics   Alcohol use: No   Drug use: Yes    Types: Marijuana    Comment: daily use     Allergies   Ciprofloxacin, Morphine and related, and Penicillins   Review of Systems Review of Systems  Per HPI   Physical Exam Triage Vital Signs ED Triage Vitals  Enc Vitals Group     BP 10/26/21 1903 (!) 170/94     Pulse Rate 10/26/21 1903 (!) 104     Resp 10/26/21 1903 18     Temp 10/26/21 1903 98.7 F (37.1 C)     Temp Source 10/26/21 1903 Oral     SpO2 10/26/21 1903 97 %     Weight --      Height --      Head Circumference --      Peak Flow --      Pain Score 10/26/21 1904 7     Pain Loc --      Pain Edu? --      Excl. in GC? --    No data found.  Updated Vital Signs BP (!) 170/94 (BP Location: Left Arm)   Pulse (!) 104   Temp 98.7 F (37.1 C) (Oral)   Resp 18   SpO2 97%    Physical  Exam Vitals and nursing note reviewed.  Constitutional:      General: She is not in acute distress.    Appearance: Normal appearance.  HENT:     Head:      Comments: Tender, fluctuant area of left lower cheek    Mouth/Throat:     Mouth: Mucous membranes are moist.     Dentition: Abnormal dentition. Dental tenderness and dental caries present.     Tongue: No lesions.     Pharynx: Oropharynx is clear. No posterior oropharyngeal erythema.     Comments: Tender on left lower inner cheek as well Cardiovascular:     Rate and Rhythm: Normal rate and regular rhythm.     Pulses: Normal pulses.     Heart sounds: Normal heart sounds.  Pulmonary:     Effort: Pulmonary effort is normal.     Breath sounds: Normal breath sounds.  Skin:    General: Skin is warm and dry.  Neurological:     Mental Status: She is alert and oriented to person, place, and time.     UC Treatments / Results  Labs (all labs ordered are listed, but only abnormal results are displayed) Labs Reviewed - No data to display  EKG  Radiology No results found.  Procedures Procedures (including critical care time)  Medications Ordered in UC Medications - No data to display  Initial Impression / Assessment and Plan / UC Course  I have reviewed the triage vital signs and the nursing notes.  Pertinent labs & imaging results that were available during my care of the patient were reviewed by me and considered in my medical decision making (see chart for details).  Cover for dental abscess versus mouth infection.  Cefdinir twice a day for 5 days.  Recommend return urgent care if symptoms do not resolve with medicine.  Also recommending warm compress.  Follow-up with dentist, provided dental resources list.  Added to primary care assistance list.  Work note provided.  Return precaution discussed patient agrees with plan  Final Clinical Impressions(s) / UC Diagnoses   Final diagnoses:  Dental abscess  Mouth abscess      Discharge Instructions      Please take medication as prescribed. Take with food to avoid upset stomach. Apply warm compress to the area of swelling. You can return to urgent care if symptoms persist.   Please follow up with a primary care provider once you establish.  Please go to the emergency department if symptoms worsen.    ED Prescriptions     Medication Sig Dispense Auth. Provider   cefdinir (OMNICEF) 300 MG capsule Take 1 capsule (300 mg total) by mouth 2 (two) times daily for 5 days. 10 capsule Laresa Oshiro, Lurena Joiner, PA-C      PDMP not reviewed this encounter.   Kathrine Haddock 10/26/21 1925

## 2021-10-26 NOTE — ED Triage Notes (Signed)
Pt c/o Lt lower jaw swelling and pain x2 days. Denies toothache, states can't eat d/t pain. Took aleve with no relief. States needs a work note.

## 2021-10-26 NOTE — Discharge Instructions (Addendum)
Please take medication as prescribed. Take with food to avoid upset stomach. Apply warm compress to the area of swelling. You can return to urgent care if symptoms persist.   Please follow up with a primary care provider once you establish.  Please go to the emergency department if symptoms worsen.

## 2021-10-28 ENCOUNTER — Encounter: Payer: Self-pay | Admitting: Emergency Medicine

## 2021-12-02 ENCOUNTER — Ambulatory Visit: Payer: Self-pay | Admitting: *Deleted

## 2021-12-02 NOTE — Telephone Encounter (Signed)
Opened chart to answer question for a pharmacist regarding prescriptions given to pt from ED.   Pt is saying Dr. Redmond Pulling gave her the rx.however this pt has never been seen by Dr. Redmond Pulling (Primary [facility] at [facility]).    She has a new pt appt. Set up but has not been seen.     When agent went to transfer the pharmacist to me the pharmacist had hung up.   Not sure which pharmacy she was calling from.

## 2022-01-26 ENCOUNTER — Encounter: Payer: Self-pay | Admitting: Family Medicine

## 2022-01-26 ENCOUNTER — Ambulatory Visit (INDEPENDENT_AMBULATORY_CARE_PROVIDER_SITE_OTHER): Payer: Commercial Managed Care - HMO | Admitting: Family Medicine

## 2022-01-26 VITALS — BP 166/101 | HR 90 | Temp 98.1°F | Resp 16 | Ht 60.0 in | Wt 142.2 lb

## 2022-01-26 DIAGNOSIS — I10 Essential (primary) hypertension: Secondary | ICD-10-CM | POA: Diagnosis not present

## 2022-01-26 DIAGNOSIS — N951 Menopausal and female climacteric states: Secondary | ICD-10-CM | POA: Diagnosis not present

## 2022-01-26 DIAGNOSIS — G629 Polyneuropathy, unspecified: Secondary | ICD-10-CM | POA: Diagnosis not present

## 2022-01-26 DIAGNOSIS — F17210 Nicotine dependence, cigarettes, uncomplicated: Secondary | ICD-10-CM | POA: Diagnosis not present

## 2022-01-26 DIAGNOSIS — Z23 Encounter for immunization: Secondary | ICD-10-CM | POA: Diagnosis not present

## 2022-01-26 DIAGNOSIS — Z7689 Persons encountering health services in other specified circumstances: Secondary | ICD-10-CM

## 2022-01-26 MED ORDER — LOSARTAN POTASSIUM 100 MG PO TABS
100.0000 mg | ORAL_TABLET | Freq: Every day | ORAL | 0 refills | Status: AC
Start: 2022-01-26 — End: ?

## 2022-01-26 NOTE — Progress Notes (Signed)
Patient is here to established care with provider today. Patient has many health concern they would like to discuss with provider today  Care gaps discuss at appointment today  

## 2022-01-27 ENCOUNTER — Encounter: Payer: Self-pay | Admitting: Family Medicine

## 2022-01-27 NOTE — Progress Notes (Signed)
New Patient Office Visit  Subjective    Patient ID: Rachel Fischer, female    DOB: 1970-07-10  Age: 51 y.o. MRN: [id number]  CC:  Chief Complaint  Patient presents with   Establish Care    HPI Rachel Fischer presents to establish care and for follow up of chronic med issues. 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 She did have surgery on her right(dominant) side and would like to consider it for her left side. She also reports some difficulty with her moods 2/2 perimenopausal.    Outpatient Encounter Medications as of 01/26/2022  Medication Sig   amLODipine (NORVASC) 10 MG tablet Take 1 tablet (10 mg total) by mouth daily.   cyclobenzaprine (FLEXERIL) 5 MG tablet Take 5 mg by mouth 3 (three) times daily as needed for muscle spasms.   losartan (COZAAR) 100 MG tablet Take 1 tablet (100 mg total) by mouth daily.   losartan (COZAAR) 50 MG tablet Take 1 tablet (50 mg total) by mouth daily.   No facility-administered encounter medications on file as of 01/26/2022.    Past Medical History:  Diagnosis Date   Anemia    Colon polyps    Decreased thyroid stimulating hormone (TSH) level    Depression    has not been diagnosed   Hx of trichomoniasis    Hyperlipidemia    Hypertension    not currently taking meds due to financial reasons   Lung nodule    Migraine headache    migraines; gets them 4/month   Tobacco use    Vitamin D deficiency     Past Surgical History:  Procedure Laterality Date   ABDOMINAL HYSTERECTOMY     ARM NEUROPLASTY Left    c section x 4     CESAREAN SECTION     FOOT SURGERY     ULNAR TUNNEL RELEASE Right 05/05/2021   Procedure: RIGHT CUBITAL TUNNEL RELEASE VS CARPAL TUNNEL RELEASE;  Surgeon: Marlyne Beards, MD;  Location: [facility];  Service: Orthopedics;  Laterality: Right;    Family History  Problem Relation Age of Onset   Breast cancer Mother 69   Hypertension Mother    Hyperlipidemia Mother     CAD Mother    Hypertension Father     Social History   Socioeconomic History   Marital status: Single    Spouse name: Not on file   Number of children: 4   Years of education: Not on file   Highest education level: Not on file  Occupational History   Occupation: unemployed    Employer: UNEMPLOYED  Tobacco Use   Smoking status: Every Day    Packs/day: 0.50    Years: 19.00    Total pack years: 9.50    Types: Cigarettes   Smokeless tobacco: Never  Vaping Use   Vaping Use: Some days  Substance and Sexual Activity   Alcohol use: No   Drug use: Yes    Types: Marijuana    Comment: daily use   Sexual activity: Yes    Birth control/protection: Surgical  Other Topics Concern   Not on file  Social History Narrative   Children: 3 boys and 1 girl.    Right Handed    Lives in a two story home    Social Determinants of Health   Financial Resource Strain: Not on file  Food Insecurity: Not on file  Transportation Needs: Not on file  Physical Activity: Not on  file  Stress: Not on file  Social Connections: Not on file  Intimate Partner Violence: Not on file    Review of Systems  All other systems reviewed and are negative.       Objective    BP (!) 166/101   Pulse 90   Temp 98.1 F (36.7 C) (Oral)   Resp 16   Ht 5' (1.524 m)   Wt 142 lb 3.2 oz (64.5 kg)   SpO2 98%   BMI 27.77 kg/m   Physical Exam Vitals and nursing note reviewed.  Constitutional:      General: She is not in acute distress. Cardiovascular:     Rate and Rhythm: Normal rate and regular rhythm.  Pulmonary:     Effort: Pulmonary effort is normal.     Breath sounds: Normal breath sounds.  Abdominal:     Palpations: Abdomen is soft.     Tenderness: There is no abdominal tenderness.  Neurological:     General: No focal deficit present.     Mental Status: She is alert and oriented to person, place, and time.  Psychiatric:        Mood and Affect: Mood and affect normal.          Assessment & Plan:   1. Uncontrolled hypertension Elevated reading. Will increase losartan 100mg  daily and monitor  2. Neuropathy Patient defers further eval/mgt at this time  3. Perimenopausal Discussed dietary and activity and activity options. Patient defers med management at this time  4. Smoking 1/2 pack a day or less Discussed reduction/cessation  5. Encounter to establish care     Return in about 2 weeks (around 02/09/2022) for follow up.   Becky Sax, MD

## 2022-02-08 ENCOUNTER — Ambulatory Visit: Payer: Commercial Managed Care - HMO | Admitting: Family Medicine

## 2022-03-07 ENCOUNTER — Ambulatory Visit: Payer: Self-pay | Admitting: Family Medicine

## 2022-06-23 ENCOUNTER — Other Ambulatory Visit: Payer: Self-pay

## 2022-06-23 ENCOUNTER — Encounter: Payer: Self-pay | Admitting: Emergency Medicine

## 2022-06-23 ENCOUNTER — Ambulatory Visit
Admission: EM | Admit: 2022-06-23 | Discharge: 2022-06-23 | Disposition: A | Discharge status: Home / Self Care | Payer: BLUE CROSS/BLUE SHIELD | Attending: Family Medicine | Admitting: Family Medicine

## 2022-06-23 DIAGNOSIS — S60418A Abrasion of other finger, initial encounter: Secondary | ICD-10-CM

## 2022-06-23 MED ORDER — KETOROLAC TROMETHAMINE 30 MG/ML IJ SOLN
30.0000 mg | Freq: Once | INTRAMUSCULAR | Status: AC
  Administered 2022-06-23: 30 mg via INTRAMUSCULAR

## 2022-06-23 MED ORDER — HYDROCODONE-ACETAMINOPHEN 5-325 MG PO TABS: 1.0000 | ORAL_TABLET | Freq: Four times a day (QID) | ORAL | 0 refills | Status: DC | PRN

## 2022-06-23 NOTE — ED Triage Notes (Signed)
Pt here for laceration to right middle finger with meat slicer; bleeding controlled with bandage

## 2022-06-23 NOTE — Discharge Instructions (Addendum)
Hydrocodone 5 mg--1 tablet every 6 hours as needed for pain.  This is best taken with food.  It can cause sleepiness or dizziness  Come back tomorrow here for recheck and we can unwrap her dressing then.  If you see that it is hurting worse or if it is bleeding through the bandage that we have supplied today, and please present to the emergency room.

## 2022-06-23 NOTE — ED Provider Notes (Signed)
[facility]    CSN: [id number] Arrival date & time: 06/23/22  1332      History   Chief Complaint Chief Complaint  Patient presents with   Laceration    HPI Rachel Fischer is a 52 y.o. female.    Laceration  Here for pain in and bleeding from her right middle finger. She the end of her right fingernail finger on a meat slicer today and presents here for care   She is allergic to morphine, Cipro, and penicillin.  Last menstrual cycle was sometime ago she has had a hysterectomy  Past Medical History:  Diagnosis Date   Anemia    Colon polyps    Decreased thyroid stimulating hormone (TSH) level    Depression    has not been diagnosed   Hx of trichomoniasis    Hyperlipidemia    Hypertension    not currently taking meds due to financial reasons   Lung nodule    Migraine headache    migraines; gets them 4/month   Tobacco use    Vitamin D deficiency     Patient Active Problem List   Diagnosis Date Noted   Hypertensive urgency 02/28/2021   Right wrist tendinitis 01/04/2021   Cubital tunnel syndrome, right 01/04/2021   Carpal tunnel syndrome, right 06/27/2018   Pain and swelling of right wrist 06/27/2018   Smoker 06/27/2018   Decreased thyroid stimulating hormone (TSH) level 06/27/2018   Ovarian cyst 03/17/2016   Family history of breast cancer 03/17/2016   Blurred vision    Cephalalgia    Vision changes    Secondary hypertension, unspecified    Headache 08/06/2015   Pulmonary nodule 06/07/2011    Past Surgical History:  Procedure Laterality Date   ABDOMINAL HYSTERECTOMY     ARM NEUROPLASTY Left    c section x 4     CESAREAN SECTION     FOOT SURGERY     ULNAR TUNNEL RELEASE Right 05/05/2021   Procedure: RIGHT CUBITAL TUNNEL RELEASE VS CARPAL TUNNEL RELEASE;  Surgeon: Marlyne Beards, MD;  Location: [facility];  Service: Orthopedics;  Laterality: Right;    OB History     Gravida  4   Para  4   Term  4   Preterm       AB      Living  4      SAB      IAB      Ectopic      Multiple      Live Births  4            Home Medications    Prior to Admission medications   Medication Sig Start Date End Date Taking? Authorizing Provider  amLODipine (NORVASC) 10 MG tablet Take 1 tablet (10 mg total) by mouth daily. 10/13/21   Tilden Fossa, MD  cyclobenzaprine (FLEXERIL) 5 MG tablet Take 5 mg by mouth 3 (three) times daily as needed for muscle spasms.    [provider]  losartan (COZAAR) 100 MG tablet Take 1 tablet (100 mg total) by mouth daily. 01/26/22   Georganna Skeans, MD  losartan (COZAAR) 50 MG tablet Take 1 tablet (50 mg total) by mouth daily. 10/13/21   Tilden Fossa, MD    Family History Family History  Problem Relation Age of Onset   Breast cancer Mother 12   Hypertension Mother    Hyperlipidemia Mother    CAD Mother    Hypertension Father  Social History Social History   Tobacco Use   Smoking status: Every Day    Packs/day: 0.50    Years: 19.00    Additional pack years: 0.00    Total pack years: 9.50    Types: Cigarettes   Smokeless tobacco: Never  Vaping Use   Vaping Use: Some days  Substance Use Topics   Alcohol use: No   Drug use: Yes    Types: Marijuana    Comment: daily use     Allergies   Ciprofloxacin, Morphine and related, and Penicillins   Review of Systems Review of Systems   Physical Exam Triage Vital Signs ED Triage Vitals [06/23/22 1359]  Enc Vitals Group     BP (!) 157/89     Pulse Rate 97     Resp 18     Temp 97.8 F (36.6 C)     Temp Source Oral     SpO2 96 %     Weight      Height      Head Circumference      Peak Flow      Pain Score 6     Pain Loc      Pain Edu?      Excl. in GC?    No data found.  Updated Vital Signs BP (!) 157/89 (BP Location: Left Arm)   Pulse 97   Temp 97.8 F (36.6 C) (Oral)   Resp 18   SpO2 96%   Visual Acuity Right Eye Distance:   Left Eye Distance:   Bilateral  Distance:    Right Eye Near:   Left Eye Near:    Bilateral Near:     Physical Exam Vitals reviewed.  Constitutional:      General: She is not in acute distress.    Appearance: She is not ill-appearing, toxic-appearing or diaphoretic.  Skin:    Coloration: Skin is not pale.     Comments: Initially it is evident that there is a full-thickness abrasion of the dorsal surface of the right middle finger with about half of the fingernail being gone.  It is bleeding fairly briskly.  Neurological:     General: No focal deficit present.     Mental Status: She is alert and oriented to person, place, and time.  Psychiatric:        Behavior: Behavior normal.      UC Treatments / Results  Labs (all labs ordered are listed, but only abnormal results are displayed) Labs Reviewed - No data to display  EKG   Radiology No results found.  Procedures Procedures (including critical care time)  Medications Ordered in UC Medications  ketorolac (TORADOL) 30 MG/ML injection 30 mg (30 mg Intramuscular Given 06/23/22 1412)    Initial Impression / Assessment and Plan / UC Course  I have reviewed the triage vital signs and the nursing notes.  Pertinent labs & imaging results that were available during my care of the patient were reviewed by me and considered in my medical decision making (see chart for details).       Pressure dressing is applied.  Toradol injection is given After 30 minutes I was going to take off the pressure bandage and see if I can at least help with some silver nitrate cautery.  Turns out we do not have silver nitrate sticks here in this location's clinic.  Therefore the pressure bandage is not removed.  She will return tomorrow for recheck.  Hydrocodone is sent in for  pain relief.  Warnings given for the ER Final Clinical Impressions(s) / UC Diagnoses   Final diagnoses:  Abrasion of middle finger, initial encounter     Discharge Instructions      Hydrocodone 5  mg--1 tablet every 6 hours as needed for pain.  This is best taken with food.  It can cause sleepiness or dizziness  Come back tomorrow here for recheck and we can unwrap her dressing then.  If you see that it is hurting worse or if it is bleeding through the bandage that we have supplied today, and please present to the emergency room.      ED Prescriptions   None    PDMP not reviewed this encounter.   Zenia Resides, MD 06/23/22 1452

## 2022-06-24 ENCOUNTER — Ambulatory Visit
Admission: EM | Admit: 2022-06-24 | Discharge: 2022-06-24 | Disposition: A | Discharge status: Home / Self Care | Payer: BLUE CROSS/BLUE SHIELD | Attending: Internal Medicine | Admitting: Internal Medicine

## 2022-06-24 DIAGNOSIS — S61312D Laceration without foreign body of right middle finger with damage to nail, subsequent encounter: Secondary | ICD-10-CM | POA: Diagnosis not present

## 2022-06-24 NOTE — Discharge Instructions (Signed)
Change dressing daily.  Monitor for any increased redness, swelling, pus ,or limited range of motion.  Follow-up with urgent care or hand specialty if symptoms persist or worsen.

## 2022-06-24 NOTE — ED Triage Notes (Signed)
Pt reports to UC for a recheck of her right middle finger. It is very painful and throbbing.

## 2022-06-24 NOTE — ED Provider Notes (Signed)
[facility]    CSN: [id number] Arrival date & time: 06/24/22  1154      History   Chief Complaint Chief Complaint  Patient presents with   Laceration recheck    HPI Rachel Fischer is a 52 y.o. female.   Patient presents for reevaluation of laceration to the right middle finger today.  Patient was seen in urgent care yesterday but reports that they were not able to adequately evaluate laceration given that there was a lot of bleeding.  She was told to keep dressing on overnight and to follow-up today for recheck.  She reports it has been bleeding intermittently and she still has some persistent pain.  She was prescribed Norco for pain but reports that she has not taken this just yet.  Patient not reporting any numbness or tingling.  Reports limited range of motion due to pain but is able to bend finger.  States that she lacerated her finger while working at [employer].  Tetanus vaccine was in 2023.     Past Medical History:  Diagnosis Date   Anemia    Colon polyps    Decreased thyroid stimulating hormone (TSH) level    Depression    has not been diagnosed   Hx of trichomoniasis    Hyperlipidemia    Hypertension    not currently taking meds due to financial reasons   Lung nodule    Migraine headache    migraines; gets them 4/month   Tobacco use    Vitamin D deficiency     Patient Active Problem List   Diagnosis Date Noted   Hypertensive urgency 02/28/2021   Right wrist tendinitis 01/04/2021   Cubital tunnel syndrome, right 01/04/2021   Carpal tunnel syndrome, right 06/27/2018   Pain and swelling of right wrist 06/27/2018   Smoker 06/27/2018   Decreased thyroid stimulating hormone (TSH) level 06/27/2018   Ovarian cyst 03/17/2016   Family history of breast cancer 03/17/2016   Blurred vision    Cephalalgia    Vision changes    Secondary hypertension, unspecified    Headache 08/06/2015   Pulmonary nodule 06/07/2011    Past Surgical History:   Procedure Laterality Date   ABDOMINAL HYSTERECTOMY     ARM NEUROPLASTY Left    c section x 4     CESAREAN SECTION     FOOT SURGERY     ULNAR TUNNEL RELEASE Right 05/05/2021   Procedure: RIGHT CUBITAL TUNNEL RELEASE VS CARPAL TUNNEL RELEASE;  Surgeon: Marlyne Beards, MD;  Location: [facility];  Service: Orthopedics;  Laterality: Right;    OB History     Gravida  4   Para  4   Term  4   Preterm      AB      Living  4      SAB      IAB      Ectopic      Multiple      Live Births  4            Home Medications    Prior to Admission medications   Medication Sig Start Date End Date Taking? Authorizing Provider  amLODipine (NORVASC) 10 MG tablet Take 1 tablet (10 mg total) by mouth daily. 10/13/21   Tilden Fossa, MD  cyclobenzaprine (FLEXERIL) 5 MG tablet Take 5 mg by mouth 3 (three) times daily as needed for muscle spasms.    [provider]  HYDROcodone-acetaminophen (NORCO/VICODIN) 5-325 MG  tablet Take 1 tablet by mouth every 6 (six) hours as needed (pain). 06/23/22   Zenia Resides, MD  losartan (COZAAR) 100 MG tablet Take 1 tablet (100 mg total) by mouth daily. 01/26/22   Georganna Skeans, MD  losartan (COZAAR) 50 MG tablet Take 1 tablet (50 mg total) by mouth daily. 10/13/21   Tilden Fossa, MD    Family History Family History  Problem Relation Age of Onset   Breast cancer Mother 8   Hypertension Mother    Hyperlipidemia Mother    CAD Mother    Hypertension Father     Social History Social History   Tobacco Use   Smoking status: Every Day    Packs/day: 0.50    Years: 19.00    Additional pack years: 0.00    Total pack years: 9.50    Types: Cigarettes   Smokeless tobacco: Never  Vaping Use   Vaping Use: Some days  Substance Use Topics   Alcohol use: No   Drug use: Yes    Types: Marijuana    Comment: daily use     Allergies   Ciprofloxacin, Morphine and related, and Penicillins   Review of  Systems Review of Systems Per HPI  Physical Exam Triage Vital Signs ED Triage Vitals  Enc Vitals Group     BP 06/24/22 1306 (!) 157/96     Pulse Rate 06/24/22 1306 90     Resp 06/24/22 1306 18     Temp 06/24/22 1306 97.9 F (36.6 C)     Temp Source 06/24/22 1306 Oral     SpO2 06/24/22 1306 98 %     Weight --      Height --      Head Circumference --      Peak Flow --      Pain Score 06/24/22 1308 8     Pain Loc --      Pain Edu? --      Excl. in GC? --    No data found.  Updated Vital Signs BP (!) 157/96 (BP Location: Left Arm)   Pulse 90   Temp 97.9 F (36.6 C) (Oral)   Resp 18   SpO2 98%   Visual Acuity Right Eye Distance:   Left Eye Distance:   Bilateral Distance:    Right Eye Near:   Left Eye Near:    Bilateral Near:     Physical Exam Constitutional:      General: She is not in acute distress.    Appearance: Normal appearance. She is not toxic-appearing or diaphoretic.  HENT:     Head: Normocephalic and atraumatic.  Eyes:     Extraocular Movements: Extraocular movements intact.     Conjunctiva/sclera: Conjunctivae normal.  Pulmonary:     Effort: Pulmonary effort is normal.  Skin:    Comments: Patient has a curvilinear avulsion laceration present to the distal end of the right middle finger where the nail is removed and nailbed is visible.  No bleeding noted.  Bleeding controlled.  Capillary refill and pulses intact.  Minimal swelling noted.  Patient has full range of motion of finger.  Remainder of nail is intact to nailbed.  Neurological:     General: No focal deficit present.     Mental Status: She is alert and oriented to person, place, and time. Mental status is at baseline.  Psychiatric:        Mood and Affect: Mood normal.        Behavior: Behavior  normal.        Thought Content: Thought content normal.        Judgment: Judgment normal.      UC Treatments / Results  Labs (all labs ordered are listed, but only abnormal results are  displayed) Labs Reviewed - No data to display  EKG   Radiology No results found.  Procedures Procedures (including critical care time)  Medications Ordered in UC Medications - No data to display  Initial Impression / Assessment and Plan / UC Course  I have reviewed the triage vital signs and the nursing notes.  Pertinent labs & imaging results that were available during my care of the patient were reviewed by me and considered in my medical decision making (see chart for details).     Patient has an avulsion laceration present to the distal right middle finger and nail.  Remainder of nail is still intact to the nailbed so no need for fingernail removal.  No need for sutures either given it is an avulsion laceration.  Suggested to patient cleaning of wound prior to discharge but she declined this as she was afraid that the agitation would be painful and caused the bleeding to restart.  Encouraged patient to clean wound at home and change dressing daily.  Dressing applied by clinical staff prior to discharge.  Tetanus vaccine is up-to-date.  Advised following with urgent care or hand specialty at provided contact information if symptoms persist or worsen.  Do not think imaging is necessary given patient has full range of motion of finger.  Patient verbalized understanding and was agreeable with plan. Final Clinical Impressions(s) / UC Diagnoses   Final diagnoses:  Laceration of right middle finger without foreign body with damage to nail, subsequent encounter     Discharge Instructions      Change dressing daily.  Monitor for any increased redness, swelling, pus ,or limited range of motion.  Follow-up with urgent care or hand specialty if symptoms persist or worsen.    ED Prescriptions   None    PDMP not reviewed this encounter.   Gustavus Bryant, [place] 06/24/22 1416

## 2022-08-02 ENCOUNTER — Encounter: Payer: Self-pay | Admitting: Cardiovascular Disease

## 2022-08-02 ENCOUNTER — Ambulatory Visit: Payer: BLUE CROSS/BLUE SHIELD | Attending: Cardiovascular Disease | Admitting: Cardiovascular Disease

## 2022-08-02 VITALS — BP 145/95 | HR 76 | Ht 60.0 in | Wt 142.8 lb

## 2022-08-02 DIAGNOSIS — Z79899 Other long term (current) drug therapy: Secondary | ICD-10-CM | POA: Diagnosis not present

## 2022-08-02 DIAGNOSIS — I10 Essential (primary) hypertension: Secondary | ICD-10-CM

## 2022-08-02 MED ORDER — SPIRONOLACTONE 25 MG PO TABS
25.0000 mg | ORAL_TABLET | Freq: Every day | ORAL | 3 refills | Status: DC
Start: 2022-08-02 — End: 2022-12-07

## 2022-08-02 NOTE — Progress Notes (Signed)
Cardiology Office Note:    Date:  08/02/2022   ID:  Rachel Fischer, DOB 06-Nov-1970, MRN [id number]  PCP:  [facility]   [facility] Providers Cardiologist:  Kristeen Miss, MD {   Referring MD: Quita Skye, PA-C   Chief Complaint  Patient presents with   Hypertension         History of Present Illness:    Rachel Fischer is a 52 y.o. female with a hx of HTN.   (DA-Lena) is seen today for evaluation of her HTN BP is normal today  Has had HTN since 2007 + family hx of HTN  BP is normal today - she forgot her BP meds  Has not changed her diet, exercise ,   Avoids fast foods  She works at [employer])  Knows that she needs to avoid salt   Still smokes - used to smoke 1 ppd Now smokes 7 cigarettes a day    Difficult to stop    No CP , no dyspnea  No yardwork   She has a strong family history of hypertension.  Is back through her records she has had very low potassium levels for years, even before she started on hydrochlorothiazide.    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  I see that she is on the hydrochlorothiazide.  I think she will do much better on spironolactone Past Medical History:  Diagnosis Date   Anemia    Colon polyps    Decreased thyroid stimulating hormone (TSH) level    Depression    has not been diagnosed   Hx of trichomoniasis    Hyperlipidemia    Hypertension    not currently taking meds due to financial reasons   Lung nodule    Migraine headache    migraines; gets them 4/month   Tobacco use    Vitamin D deficiency     Past Surgical History:  Procedure Laterality Date   ABDOMINAL HYSTERECTOMY     ARM NEUROPLASTY Left    c section x 4     CESAREAN SECTION     FOOT SURGERY     ULNAR TUNNEL RELEASE Right 05/05/2021   Procedure: RIGHT CUBITAL TUNNEL RELEASE VS CARPAL TUNNEL RELEASE;  Surgeon: Marlyne Beards, MD;  Location: [facility];  Service: Orthopedics;   Laterality: Right;    Current Medications: Current Meds  Medication Sig   amLODipine (NORVASC) 10 MG tablet Take 1 tablet (10 mg total) by mouth daily.   losartan (COZAAR) 100 MG tablet Take 1 tablet (100 mg total) by mouth daily.   spironolactone (ALDACTONE) 25 MG tablet Take 1 tablet (25 mg total) by mouth daily.   [DISCONTINUED] hydrochlorothiazide (HYDRODIURIL) 12.5 MG tablet Take 1 tablet by mouth daily.     Allergies:   Ciprofloxacin, Morphine and codeine, and Penicillins   Social History   Socioeconomic History   Marital status: Single    Spouse name: Not on file   Number of children: 4   Years of education: Not on file   Highest education level: Not on file  Occupational History   Occupation: unemployed    Employer: UNEMPLOYED  Tobacco Use   Smoking status: Every Day    Packs/day: 0.50    Years: 19.00    Additional pack years: 0.00    Total pack years: 9.50    Types: Cigarettes   Smokeless tobacco: Never  Vaping Use   Vaping Use: Some  days  Substance and Sexual Activity   Alcohol use: No   Drug use: Yes    Types: Marijuana    Comment: daily use   Sexual activity: Yes    Birth control/protection: Surgical  Other Topics Concern   Not on file  Social History Narrative   Children: 3 boys and 1 girl.    Right Handed    Lives in a two story home    Social Determinants of Health   Financial Resource Strain: Not on file  Food Insecurity: Not on file  Transportation Needs: Not on file  Physical Activity: Not on file  Stress: Not on file  Social Connections: Not on file     Family History: The patient's family history includes Breast cancer (age of onset: 25) in her mother; CAD in her mother; Hyperlipidemia in her mother; Hypertension in her father and mother.  ROS:   Please see the history of present illness.     All other systems reviewed and are negative.  EKGs/Labs/Other Studies Reviewed:    The following studies were reviewed today:   EKG:   August 02, 2022:  NSR at 23.   Normal ECG   Recent Labs: 10/12/2021: BUN 5; Creatinine, Ser 0.78; Hemoglobin 12.8; Platelets 358; Potassium 3.3; Sodium 142 10/13/2021: ALT 10  Recent Lipid Panel    Component Value Date/Time   CHOL 200 (H) 05/18/2018 1408   TRIG 151 (H) 05/18/2018 1408   HDL 43 05/18/2018 1408   CHOLHDL 4.7 (H) 05/18/2018 1408   CHOLHDL 3.6 08/06/2015 1955   VLDL 10 08/06/2015 1955   LDLCALC 127 (H) 05/18/2018 1408     Risk Assessment/Calculations:      HYPERTENSION CONTROL Vitals:   08/02/22 0953 08/02/22 1008  BP: 132/82 (!) 145/95    The patient's blood pressure is elevated above target today.  In order to address the patient's elevated BP: A new medication was prescribed today.            Physical Exam:    VS:  BP (!) 145/95   Pulse 76   Ht 5' (1.524 m)   Wt 142 lb 12.8 oz (64.8 kg)   SpO2 99%   BMI 27.89 kg/m     Wt Readings from Last 3 Encounters:  08/02/22 142 lb 12.8 oz (64.8 kg)  01/26/22 142 lb 3.2 oz (64.5 kg)  05/05/21 152 lb 1.9 oz (69 kg)     GEN:  Well nourished, well developed in no acute distress HEENT: Normal NECK: No JVD; No carotid bruits LYMPHATICS: No lymphadenopathy CARDIAC: RRR, no murmurs, rubs, gallops RESPIRATORY:  Clear to auscultation without rales, wheezing or rhonchi  ABDOMEN: Soft, non-tender, non-distended MUSCULOSKELETAL:  No edema; No deformity  SKIN: Warm and dry NEUROLOGIC:  Alert and oriented x 3 PSYCHIATRIC:  Normal affect   ASSESSMENT:    1. Primary hypertension   2. Medication management    PLAN:      HTN:  Is back through her records she has had very low potassium levels for years, even before she started on hydrochlorothiazide.    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  I see that she is on the hydrochlorothiazide.  I think she will do much better on spironolactone 25 mg a day .  She will keep a BP log and will bring it to her next appt.   She will work on cigarette smoking ,   Increasing her exercise  Medication Adjustments/Labs and Tests Ordered: Current medicines are reviewed at length with the patient today.  Concerns regarding medicines are outlined above.  Orders Placed This Encounter  Procedures   Basic metabolic panel   EKG 12-Lead   Meds ordered this encounter  Medications   spironolactone (ALDACTONE) 25 MG tablet    Sig: Take 1 tablet (25 mg total) by mouth daily.    Dispense:  90 tablet    Refill:  3    Patient Instructions  Medication Instructions:  STOP Hydrochlorothiazide START Spironolactone 25mg  daily *If you need a refill on your cardiac medications before your next appointment, please call your pharmacy*   Lab Work: BMET in 1-2 weeks If you have labs (blood work) drawn today and your tests are completely normal, you will receive your results only by: MyChart Message (if you have MyChart) OR A paper copy in the mail If you have any lab test that is abnormal or we need to change your treatment, we will call you to review the results.   Testing/Procedures: NONE   Follow-Up: At [facility], you and your health needs are our priority.  As part of our continuing mission to provide you with exceptional heart care, we have created designated Provider Care Teams.  These Care Teams include your primary Cardiologist (physician) and Advanced Practice Providers (APPs -  Physician Assistants and Nurse Practitioners) who all work together to provide you with the care you need, when you need it.  We recommend signing up for the patient portal called "MyChart".  Sign up information is provided on this After Visit Summary.  MyChart is used to connect with patients for Virtual Visits (Telemedicine).  Patients are able to view lab/test results, encounter notes, upcoming appointments, etc.  Non-urgent messages can be sent to your provider as well.   To learn more about what you can do with MyChart, go to  [email or web address].    Your next appointment:   2 month(s)  Provider:   Kristeen Miss, MD     Signed, Kristeen Miss, MD  08/02/2022 10:32 AM    [facility]

## 2022-08-02 NOTE — Patient Instructions (Signed)
Medication Instructions:  STOP Hydrochlorothiazide START Spironolactone 25mg  daily *If you need a refill on your cardiac medications before your next appointment, please call your pharmacy*   Lab Work: BMET in 1-2 weeks If you have labs (blood work) drawn today and your tests are completely normal, you will receive your results only by: MyChart Message (if you have MyChart) OR A paper copy in the mail If you have any lab test that is abnormal or we need to change your treatment, we will call you to review the results.   Testing/Procedures: NONE   Follow-Up: At [facility], you and your health needs are our priority.  As part of our continuing mission to provide you with exceptional heart care, we have created designated Provider Care Teams.  These Care Teams include your primary Cardiologist (physician) and Advanced Practice Providers (APPs -  Physician Assistants and Nurse Practitioners) who all work together to provide you with the care you need, when you need it.  We recommend signing up for the patient portal called "MyChart".  Sign up information is provided on this After Visit Summary.  MyChart is used to connect with patients for Virtual Visits (Telemedicine).  Patients are able to view lab/test results, encounter notes, upcoming appointments, etc.  Non-urgent messages can be sent to your provider as well.   To learn more about what you can do with MyChart, go to [email or web address].    Your next appointment:   2 month(s)  Provider:   Kristeen Miss, MD

## 2022-08-16 ENCOUNTER — Ambulatory Visit: Payer: BLUE CROSS/BLUE SHIELD | Attending: Cardiovascular Disease

## 2022-08-16 DIAGNOSIS — Z79899 Other long term (current) drug therapy: Secondary | ICD-10-CM

## 2022-08-16 DIAGNOSIS — I10 Essential (primary) hypertension: Secondary | ICD-10-CM

## 2022-08-17 LAB — BASIC METABOLIC PANEL
BUN/Creatinine Ratio: 10 (ref 9–23)
BUN: 8 mg/dL (ref 6–24)
CO2: 26 mmol/L (ref 20–29)
Calcium: 9.8 mg/dL (ref 8.7–10.2)
Chloride: 103 mmol/L (ref 96–106)
Creatinine, Ser: 0.79 mg/dL (ref 0.57–1.00)
Glucose: 93 mg/dL (ref 70–99)
Potassium: 3.7 mmol/L (ref 3.5–5.2)
Sodium: 142 mmol/L (ref 134–144)
eGFR: 91 mL/min/{1.73_m2} (ref >59)

## 2022-10-04 ENCOUNTER — Ambulatory Visit: Payer: BLUE CROSS/BLUE SHIELD | Admitting: Cardiovascular Disease

## 2022-11-08 ENCOUNTER — Other Ambulatory Visit: Payer: Self-pay

## 2022-11-08 DIAGNOSIS — R7989 Other specified abnormal findings of blood chemistry: Secondary | ICD-10-CM

## 2022-11-22 ENCOUNTER — Other Ambulatory Visit: Payer: Self-pay | Admitting: Physician Assistant

## 2022-11-22 ENCOUNTER — Other Ambulatory Visit (INDEPENDENT_AMBULATORY_CARE_PROVIDER_SITE_OTHER): Payer: BLUE CROSS/BLUE SHIELD

## 2022-11-22 DIAGNOSIS — R7989 Other specified abnormal findings of blood chemistry: Secondary | ICD-10-CM

## 2022-11-22 DIAGNOSIS — Z720 Tobacco use: Secondary | ICD-10-CM

## 2022-11-22 LAB — T4, FREE: Free T4: 0.67 ng/dL (ref 0.60–1.60)

## 2022-11-22 LAB — TSH: TSH: 0.69 u[IU]/mL (ref 0.35–5.50)

## 2022-11-24 ENCOUNTER — Encounter: Payer: Self-pay | Admitting: Physician Assistant

## 2022-11-24 DIAGNOSIS — Z1231 Encounter for screening mammogram for malignant neoplasm of breast: Secondary | ICD-10-CM

## 2022-11-25 ENCOUNTER — Ambulatory Visit (INDEPENDENT_AMBULATORY_CARE_PROVIDER_SITE_OTHER): Payer: BLUE CROSS/BLUE SHIELD | Admitting: "Endocrinology

## 2022-11-25 ENCOUNTER — Encounter: Payer: Self-pay | Admitting: "Endocrinology

## 2022-11-25 VITALS — BP 133/90 | HR 99 | Ht 60.0 in | Wt 138.2 lb

## 2022-11-25 DIAGNOSIS — I159 Secondary hypertension, unspecified: Secondary | ICD-10-CM | POA: Diagnosis not present

## 2022-11-25 DIAGNOSIS — N951 Menopausal and female climacteric states: Secondary | ICD-10-CM | POA: Diagnosis not present

## 2022-11-25 DIAGNOSIS — R7989 Other specified abnormal findings of blood chemistry: Secondary | ICD-10-CM

## 2022-11-25 LAB — COMPREHENSIVE METABOLIC PANEL
ALT: 10 U/L (ref 0–35)
AST: 19 U/L (ref 0–37)
Albumin: 4.5 g/dL (ref 3.5–5.2)
Alkaline Phosphatase: 85 U/L (ref 39–117)
BUN: 5 mg/dL — ABNORMAL LOW (ref 6–23)
CO2: 31 meq/L (ref 19–32)
Calcium: 9.7 mg/dL (ref 8.4–10.5)
Chloride: 104 meq/L (ref 96–112)
Creatinine, Ser: 0.71 mg/dL (ref 0.40–1.20)
GFR: 99.24 mL/min (ref >60.00)
Glucose, Bld: 94 mg/dL (ref 70–99)
Potassium: 3.9 meq/L (ref 3.5–5.1)
Sodium: 141 meq/L (ref 135–145)
Total Bilirubin: 0.2 mg/dL (ref 0.2–1.2)
Total Protein: 7.3 g/dL (ref 6.0–8.3)

## 2022-11-25 MED ORDER — PAROXETINE HCL 10 MG PO TABS
10.0000 mg | ORAL_TABLET | Freq: Every day | ORAL | 4 refills | Status: DC
Start: 2022-11-25 — End: 2022-12-29

## 2022-11-25 NOTE — Progress Notes (Signed)
Outpatient Endocrinology Note Rachel Shepherd, MD  11/25/22   Rachel Fischer January 03, 1971 [id number]  Referring Provider: Quita Skye, PA-C Primary Care Provider: [facility] Subjective  No chief complaint on file.   Assessment & Plan  Diagnoses and all orders for this visit:  Low TSH level -     Thyroid stimulating immunoglobulin; Future -     TRAb (TSH Receptor Binding Antibody); Future -     TRAb (TSH Receptor Binding Antibody) -     Thyroid stimulating immunoglobulin  Hot flashes due to menopause  Secondary hypertension -     Aldosterone + renin activity w/ ratio; Future -     Comprehensive metabolic panel; Future -     Comprehensive metabolic panel -     Aldosterone + renin activity w/ ratio  Other orders -     PARoxetine (PAXIL) 10 MG tablet; Take 1 tablet (10 mg total) by mouth daily.    Rachel Fischer is currently not taking any thyroid medication, nor took it in the past. 06/08/2022 TSH was found to mildly low at 0.38, with normal free T4 at 1.3.  Repeat labs are normal, and has been normal previous to the 05/2022 lab. Patient is currently biochemically euthyroid.  Educated on thyroid axis.  Recommend the following: Annual TSH, repeat lab before next visit or sooner if symptoms of hyperthyroidism or hypothyroidism develop.   C/o extreme hot flashes, at least 20 min a day, each lasting 10 min, barely abel to sleep Wakes up drenched in sweat, wakes up 6 times in night  Discussed and started with paroxetine 10 mg daily  Patient referred for secondary hypertension, patient reports being hypertensive for many years Currently taking amlodipine 10 mg daily, losartan 100 mg daily, has not started spironolactone 25 mg daily which was her cardiologist Current blood pressure is 133/90, diastolic uncontrolled, patient reports not checking blood pressure at home Since patient has not started spironolactone, ordered aldosterone and renin  with ratio to rule out primary hyperaldosteronism   I have reviewed current medications, nurse's notes, allergies, vital signs, past medical and surgical history, family medical history, and social history for this encounter. Counseled patient on symptoms, examination findings, lab findings, imaging results, treatment decisions and monitoring and prognosis. The patient understood the recommendations and agrees with the treatment plan. All questions regarding treatment plan were fully answered.   Return in about 10 weeks (around 02/03/2023) for visit, labs today.   Rachel Avella, MD  11/25/22   I have reviewed current medications, nurse's notes, allergies, vital signs, past medical and surgical history, family medical history, and social history for this encounter. Counseled patient on symptoms, examination findings, lab findings, imaging results, treatment decisions and monitoring and prognosis. The patient understood the recommendations and agrees with the treatment plan. All questions regarding treatment plan were fully answered.   History of Present Illness Rachel Fischer is a 52 y.o. year old female who presents to our clinic to address low TSH, history of uncontrolled hypertension.  Patient also complains of hot flashes.  Symptoms suggestive of HYPOTHYROIDISM:  fatigue No weight gain No cold intolerance  No constipation  No  Symptoms suggestive of HYPERTHYROIDISM:  weight loss  No heat intolerance Yes, during hot flashes  hyperdefecation  No palpitations  Yes, with dizziness when she gets hot flash  Compressive symptoms:  dysphagia  No dysphonia  No positional dyspnea (especially with simultaneous arms elevation)  No  Smokes  Yes  On biotin  No Personal history of head/neck surgery/irradiation  No  Never been on thyroid medication.  C/o extreme hot flashes, at least 20 min a day, each lasting 10 min, barely abel to sleep Wakes up drenched in sweat, wakes up 6 times in  night   Physical Exam  BP (!) 133/90   Pulse 99   Ht 5' (1.524 m)   Wt 138 lb 3.2 oz (62.7 kg)   SpO2 99%   BMI 26.99 kg/m  Constitutional: well developed, well nourished Head: normocephalic, atraumatic, no exophthalmos Eyes: sclera anicteric, no redness Neck: no thyromegaly, no thyroid tenderness; no nodules palpated Lungs: normal respiratory effort Neurology: alert and oriented, no fine hand tremor Skin: dry, no appreciable rashes Musculoskeletal: no appreciable defects Psychiatric: normal mood and affect  Allergies Allergies  Allergen Reactions   Ciprofloxacin Other (See Comments)    Generalized muscle and joint pain.   Morphine And Codeine Itching and Nausea And Vomiting   Penicillins Hives    Has patient had a PCN reaction causing immediate rash, facial/tongue/throat swelling, SOB or lightheadedness with hypotension: No Has patient had a PCN reaction causing severe rash involving mucus membranes or skin necrosis: No Has patient had a PCN reaction that required hospitalization No Has patient had a PCN reaction occurring within the last 10 years: No If all of the above answers are "NO", then may proceed with Cephalosporin use.     Current Medications Patient's Medications  New Prescriptions   PAROXETINE (PAXIL) 10 MG TABLET    Take 1 tablet (10 mg total) by mouth daily.  Previous Medications   AMLODIPINE (NORVASC) 10 MG TABLET    Take 1 tablet (10 mg total) by mouth daily.   CYCLOBENZAPRINE (FLEXERIL) 5 MG TABLET    Take 5 mg by mouth 3 (three) times daily as needed for muscle spasms.   LOSARTAN (COZAAR) 100 MG TABLET    Take 1 tablet (100 mg total) by mouth daily.   SPIRONOLACTONE (ALDACTONE) 25 MG TABLET    Take 1 tablet (25 mg total) by mouth daily.  Modified Medications   No medications on file  Discontinued Medications   No medications on file    Past Medical History Past Medical History:  Diagnosis Date   Anemia    Colon polyps    Decreased thyroid  stimulating hormone (TSH) level    Depression    has not been diagnosed   Hx of trichomoniasis    Hyperlipidemia    Hypertension    not currently taking meds due to financial reasons   Lung nodule    Migraine headache    migraines; gets them 4/month   Tobacco use    Vitamin D deficiency     Past Surgical History Past Surgical History:  Procedure Laterality Date   ABDOMINAL HYSTERECTOMY     ARM NEUROPLASTY Left    c section x 4     CESAREAN SECTION     FOOT SURGERY     ULNAR TUNNEL RELEASE Right 05/05/2021   Procedure: RIGHT CUBITAL TUNNEL RELEASE VS CARPAL TUNNEL RELEASE;  Surgeon: Marlyne Beards, MD;  Location: [facility];  Service: Orthopedics;  Laterality: Right;    Family History family history includes Breast cancer (age of onset: 17) in her mother; CAD in her mother; Hyperlipidemia in her mother; Hypertension in her father and mother.  Social History Social History   Socioeconomic History   Marital status: Single    Spouse name: Not on file  Number of children: 4   Years of education: Not on file   Highest education level: Not on file  Occupational History   Occupation: unemployed    Employer: UNEMPLOYED  Tobacco Use   Smoking status: Every Day    Current packs/day: 0.50    Average packs/day: 0.5 packs/day for 19.0 years (9.5 ttl pk-yrs)    Types: Cigarettes   Smokeless tobacco: Never  Vaping Use   Vaping status: Some Days  Substance and Sexual Activity   Alcohol use: No   Drug use: Yes    Types: Marijuana    Comment: daily use   Sexual activity: Yes    Birth control/protection: Surgical  Other Topics Concern   Not on file  Social History Narrative   Children: 3 boys and 1 girl.    Right Handed    Lives in a two story home    Social Determinants of Health   Financial Resource Strain: Not on File (05/19/2022)   Received from [place], [place]   Financial Resource Strain    Financial Resource Strain: 0  Food Insecurity: Not on File  (11/17/2022)   Received from [facility] Insecurity    Food: 0  Transportation Needs: Not on File (05/19/2022)   Received from [place], [employer] Needs    Transportation: 0  Physical Activity: Not on File (05/19/2022)   Received from [place], [place]   Physical Activity    Physical Activity: 0  Stress: Not on File (05/19/2022)   Received from [place], [place]   Stress    Stress: 0  Social Connections: Not on File (11/06/2022)   Received from [employer]   Social Connections    Connectedness: 0  Intimate Partner Violence: Not on file    Laboratory Investigations Lab Results  Component Value Date   TSH 0.69 11/22/2022   TSH 0.823 03/01/2021   TSH 0.510 04/02/2019   FREET4 0.67 11/22/2022     No results found for: "TSI"   No components found for: "TRAB"   Lab Results  Component Value Date   CHOL 200 (H) 05/18/2018   Lab Results  Component Value Date   HDL 43 05/18/2018   Lab Results  Component Value Date   LDLCALC 127 (H) 05/18/2018   Lab Results  Component Value Date   TRIG 151 (H) 05/18/2018   Lab Results  Component Value Date   CHOLHDL 4.7 (H) 05/18/2018   Lab Results  Component Value Date   CREATININE 0.79 08/16/2022   No results found for: "GFR"    Component Value Date/Time   NA 142 08/16/2022 1133   K 3.7 08/16/2022 1133   CL 103 08/16/2022 1133   CO2 26 08/16/2022 1133   GLUCOSE 93 08/16/2022 1133   GLUCOSE 104 (H) 10/12/2021 1907   BUN 8 08/16/2022 1133   CREATININE 0.79 08/16/2022 1133   CALCIUM 9.8 08/16/2022 1133   PROT 6.3 (L) 10/13/2021 0116   PROT 6.8 05/18/2018 1408   ALBUMIN 3.8 10/13/2021 0116   ALBUMIN 4.3 05/18/2018 1408   AST 17 10/13/2021 0116   ALT 10 10/13/2021 0116   ALKPHOS 78 10/13/2021 0116   BILITOT <0.1 (L) 10/13/2021 0116   BILITOT <0.2 05/18/2018 1408   GFRNONAA >60 10/12/2021 1907   GFRAA 106 05/18/2018 1408      Latest Ref Rng & Units 08/16/2022   11:33 AM 10/12/2021    7:07 PM 03/02/2021   12:58 AM  BMP   Glucose 70 - 99 mg/dL  93  104  96   BUN 6 - 24 mg/dL 8  5  12    Creatinine 0.57 - 1.00 mg/dL 1.61  0.96  0.45   BUN/Creat Ratio 9 - 23 10     Sodium 134 - 144 mmol/L 142  142  137   Potassium 3.5 - 5.2 mmol/L 3.7  3.3  3.7   Chloride 96 - 106 mmol/L 103  105  102   CO2 20 - 29 mmol/L 26  25  23    Calcium 8.7 - 10.2 mg/dL 9.8  9.6  9.4        Component Value Date/Time   WBC 6.2 10/12/2021 1907   RBC 4.62 10/12/2021 1907   HGB 12.8 10/12/2021 1907   HCT 38.9 10/12/2021 1907   PLT 358 10/12/2021 1907   MCV 84.2 10/12/2021 1907   MCH 27.7 10/12/2021 1907   MCHC 32.9 10/12/2021 1907   RDW 13.1 10/12/2021 1907   LYMPHSABS 3.0 10/12/2021 1907   MONOABS 0.4 10/12/2021 1907   EOSABS 0.1 10/12/2021 1907   BASOSABS 0.0 10/12/2021 1907      Parts of this note may have been dictated using voice recognition software. There may be variances in spelling and vocabulary which are unintentional. Not all errors are proofread. Please notify the Thereasa Parkin if any discrepancies are noted or if the meaning of any statement is not clear.

## 2022-12-01 LAB — THYROID STIMULATING IMMUNOGLOBULIN: TSI: <89 % baseline (ref <140)

## 2022-12-01 LAB — EXTRA SPECIMEN

## 2022-12-01 LAB — ALDOSTERONE + RENIN ACTIVITY W/ RATIO
ALDO / PRA Ratio: 66.7 {ratio} — ABNORMAL HIGH (ref 0.9–28.9)
Aldosterone: 4 ng/dL
Renin Activity: 0.06 ng/mL/h — ABNORMAL LOW (ref 0.25–5.82)

## 2022-12-01 LAB — TRAB (TSH RECEPTOR BINDING ANTIBODY): TRAB: <1 [IU]/L (ref <=2.00)

## 2022-12-06 ENCOUNTER — Emergency Department (HOSPITAL_COMMUNITY)
Admission: EM | Admit: 2022-12-06 | Discharge: 2022-12-07 | Disposition: A | Discharge status: Home / Self Care | Payer: BLUE CROSS/BLUE SHIELD | Attending: Emergency Medicine | Admitting: Emergency Medicine

## 2022-12-06 ENCOUNTER — Emergency Department (HOSPITAL_COMMUNITY): Payer: BLUE CROSS/BLUE SHIELD

## 2022-12-06 ENCOUNTER — Other Ambulatory Visit: Payer: Self-pay

## 2022-12-06 ENCOUNTER — Other Ambulatory Visit: Payer: BLUE CROSS/BLUE SHIELD

## 2022-12-06 DIAGNOSIS — Z79899 Other long term (current) drug therapy: Secondary | ICD-10-CM | POA: Insufficient documentation

## 2022-12-06 DIAGNOSIS — M7918 Myalgia, other site: Secondary | ICD-10-CM | POA: Insufficient documentation

## 2022-12-06 DIAGNOSIS — I10 Essential (primary) hypertension: Secondary | ICD-10-CM | POA: Insufficient documentation

## 2022-12-06 DIAGNOSIS — Y92410 Unspecified street and highway as the place of occurrence of the external cause: Secondary | ICD-10-CM | POA: Insufficient documentation

## 2022-12-06 DIAGNOSIS — M25551 Pain in right hip: Secondary | ICD-10-CM | POA: Insufficient documentation

## 2022-12-06 MED ORDER — IOHEXOL 350 MG/ML SOLN
75.0000 mL | Freq: Once | INTRAVENOUS | Status: AC | PRN
  Administered 2022-12-06: 75 mL via INTRAVENOUS

## 2022-12-06 MED ORDER — KETOROLAC TROMETHAMINE 30 MG/ML IJ SOLN
30.0000 mg | Freq: Once | INTRAMUSCULAR | Status: AC
  Administered 2022-12-06: 30 mg via INTRAVENOUS
  Filled 2022-12-06: qty 1
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
## 2022-12-06 NOTE — ED Notes (Signed)
Patient transported to CT 
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
## 2022-12-06 NOTE — ED Notes (Signed)
Pt transported to CT ?
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
## 2022-12-06 NOTE — ED Notes (Signed)
Pt ambulated to the bathroom

## 2022-12-07 MED ORDER — MELOXICAM 15 MG PO TABS: 15.0000 mg | ORAL_TABLET | Freq: Every day | ORAL | 0 refills | Status: AC

## 2022-12-07 MED ORDER — DIAZEPAM 2 MG PO TABS
2.0000 mg | ORAL_TABLET | Freq: Once | ORAL | Status: AC
  Administered 2022-12-07: 2 mg via ORAL
  Filled 2022-12-07: qty 1

## 2022-12-07 MED ORDER — CYCLOBENZAPRINE HCL 10 MG PO TABS: 10.0000 mg | ORAL_TABLET | Freq: Two times a day (BID) | ORAL | 0 refills | Status: DC | PRN
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
## 2022-12-07 NOTE — ED Notes (Signed)
C-Collar removed

## 2022-12-29 ENCOUNTER — Other Ambulatory Visit: Payer: Self-pay | Admitting: "Endocrinology

## 2022-12-29 DIAGNOSIS — E1165 Type 2 diabetes mellitus with hyperglycemia: Secondary | ICD-10-CM

## 2022-12-29 MED ORDER — LYUMJEV KWIKPEN 100 UNIT/ML ~~LOC~~ SOPN
30.0000 [IU] | PEN_INJECTOR | Freq: Three times a day (TID) | SUBCUTANEOUS | 0 refills | Status: DC
Start: 2022-12-29 — End: 2023-05-17

## 2023-01-31 ENCOUNTER — Ambulatory Visit: Payer: BLUE CROSS/BLUE SHIELD | Admitting: "Endocrinology

## 2023-05-17 ENCOUNTER — Ambulatory Visit (HOSPITAL_COMMUNITY)
Admission: EM | Admit: 2023-05-17 | Discharge: 2023-05-17 | Disposition: A | Discharge status: Home / Self Care | Attending: Sports Medicine | Admitting: Sports Medicine

## 2023-05-17 ENCOUNTER — Encounter (HOSPITAL_COMMUNITY): Payer: Self-pay

## 2023-05-17 DIAGNOSIS — M5432 Sciatica, left side: Secondary | ICD-10-CM

## 2023-05-17 HISTORY — DX: Sciatica, unspecified side: M54.30

## 2023-05-17 MED ORDER — PREDNISONE 10 MG (21) PO TBPK: ORAL_TABLET | Freq: Every day | ORAL | 0 refills | Status: DC

## 2023-05-17 MED ORDER — DEXAMETHASONE SODIUM PHOSPHATE 10 MG/ML IJ SOLN
10.0000 mg | Freq: Once | INTRAMUSCULAR | Status: AC
  Administered 2023-05-17: 10 mg via INTRAMUSCULAR

## 2023-05-17 MED ORDER — DEXAMETHASONE SODIUM PHOSPHATE 10 MG/ML IJ SOLN
INTRAMUSCULAR | Status: AC
  Filled 2023-05-17: qty 1

## 2023-05-17 NOTE — Discharge Instructions (Addendum)
 We have treated you in clinic with a intramuscular steroid injection to help with your sciatica.  Start the steroid Dosepak and take this as prescribed as well.  Consider doing daily stretches to help prevent sciatica flareups.  If this occurs again please follow-up with an orthopedic for further evaluation.  Return to clinic for any new or urgent symptoms.

## 2023-05-17 NOTE — ED Provider Notes (Signed)
 [facility]    CSN: [id number] Arrival date & time: 05/17/23  1013      History   Chief Complaint No chief complaint on file.   HPI Rachel Fischer is a 53 y.o. female.   Patient presents to clinic over concern of left-sided lower back pain that radiates down her left leg into her knee.  She has had issues with sciatica in the past that have been treated successfully with steroids.  This started over the past few days but got much worse last night where she was unable to move around in the bed.  Endorses pain triggered by left leg movement.  Denies any trauma, injuries or falls.  Denies incontinence or inner leg numbness.  Denies urinary symptoms such as urgency, frequency or dysuria.  Has been taking Aleve and using a massage gun as well as stretching with only temporary relief.   The history is provided by the patient and medical records.    Past Medical History:  Diagnosis Date   Anemia    Colon polyps    Decreased thyroid stimulating hormone (TSH) level    Depression    has not been diagnosed   Hx of trichomoniasis    Hyperlipidemia    Hypertension    not currently taking meds due to financial reasons   Lung nodule    Migraine headache    migraines; gets them 4/month   Sciatica    Tobacco use    Vitamin D deficiency     Patient Active Problem List   Diagnosis Date Noted   Hypertensive urgency 02/28/2021   Right wrist tendinitis 01/04/2021   Cubital tunnel syndrome, right 01/04/2021   Carpal tunnel syndrome, right 06/27/2018   Pain and swelling of right wrist 06/27/2018   Smoker 06/27/2018   Decreased thyroid stimulating hormone (TSH) level 06/27/2018   Ovarian cyst 03/17/2016   Family history of breast cancer 03/17/2016   Blurred vision    Cephalalgia    Vision changes    Secondary hypertension, unspecified    Headache 08/06/2015   Pulmonary nodule 06/07/2011    Past Surgical History:  Procedure Laterality Date   ABDOMINAL HYSTERECTOMY      ARM NEUROPLASTY Left    c section x 4     CESAREAN SECTION     FOOT SURGERY     ULNAR TUNNEL RELEASE Right 05/05/2021   Procedure: RIGHT CUBITAL TUNNEL RELEASE VS CARPAL TUNNEL RELEASE;  Surgeon: Marlyne Beards, MD;  Location: [facility];  Service: Orthopedics;  Laterality: Right;    OB History     Gravida  4   Para  4   Term  4   Preterm      AB      Living  4      SAB      IAB      Ectopic      Multiple      Live Births  4            Home Medications    Prior to Admission medications   Medication Sig Start Date End Date Taking? Authorizing Provider  predniSONE (STERAPRED UNI-PAK 21 TAB) 10 MG (21) TBPK tablet Take by mouth daily. Take as prescribed. 05/17/23  Yes Rinaldo Ratel, [place] N, FNP  amLODipine (NORVASC) 10 MG tablet Take 1 tablet (10 mg total) by mouth daily. 10/13/21   Tilden Fossa, MD  losartan (COZAAR) 100 MG tablet Take 1 tablet (100 mg total) by mouth  daily. 01/26/22   Georganna Skeans, MD    Family History Family History  Problem Relation Age of Onset   Breast cancer Mother 83   Hypertension Mother    Hyperlipidemia Mother    CAD Mother    Hypertension Father     Social History Social History   Tobacco Use   Smoking status: Every Day    Current packs/day: 0.50    Average packs/day: 0.5 packs/day for 19.0 years (9.5 ttl pk-yrs)    Types: Cigarettes   Smokeless tobacco: Never  Vaping Use   Vaping status: Former  Substance Use Topics   Alcohol use: No   Drug use: Yes    Types: Marijuana    Comment: daily use     Allergies   Ciprofloxacin, Morphine and codeine, and Penicillins   Review of Systems Review of Systems  Per HPI  Physical Exam Triage Vital Signs ED Triage Vitals [05/17/23 1058]  Encounter Vitals Group     BP (!) 151/95     Systolic BP Percentile      Diastolic BP Percentile      Pulse Rate 97     Resp 16     Temp 98.6 F (37 C)     Temp Source Oral     SpO2 98 %     Weight       Height      Head Circumference      Peak Flow      Pain Score 10     Pain Loc      Pain Education      Exclude from Growth Chart    No data found.  Updated Vital Signs BP (!) 151/95 (BP Location: Right Arm)   Pulse 97   Temp 98.6 F (37 C) (Oral)   Resp 16   SpO2 98%   Visual Acuity Right Eye Distance:   Left Eye Distance:   Bilateral Distance:    Right Eye Near:   Left Eye Near:    Bilateral Near:     Physical Exam Vitals and nursing note reviewed.  Constitutional:      Appearance: Normal appearance.  HENT:     Head: Normocephalic and atraumatic.     Right Ear: External ear normal.     Left Ear: External ear normal.     Nose: Nose normal.     Mouth/Throat:     Mouth: Mucous membranes are moist.  Eyes:     Conjunctiva/sclera: Conjunctivae normal.  Cardiovascular:     Rate and Rhythm: Normal rate.  Pulmonary:     Effort: Pulmonary effort is normal. No respiratory distress.  Musculoskeletal:        General: Tenderness present.     Lumbar back: Tenderness present. Positive left straight leg raise test.       Back:     Comments: Tenderness over the left glute with a positive left-sided straight leg raise.  Spine without step-off or deformity.  Skin:    General: Skin is warm and dry.  Neurological:     General: No focal deficit present.     Mental Status: She is alert.  Psychiatric:        Mood and Affect: Mood normal.        Behavior: Behavior is cooperative.      UC Treatments / Results  Labs (all labs ordered are listed, but only abnormal results are displayed) Labs Reviewed - No data to display  EKG   Radiology No results  found.  Procedures Procedures (including critical care time)  Medications Ordered in UC Medications  dexamethasone (DECADRON) injection 10 mg (has no administration in time range)    Initial Impression / Assessment and Plan / UC Course  I have reviewed the triage vital signs and the nursing notes.  Pertinent labs &  imaging results that were available during my care of the patient were reviewed by me and considered in my medical decision making (see chart for details).  Vitals in triage reviewed, patient is hemodynamically stable.  Left gluteal tenderness without rashes, radiates down the back of the left leg.  History of sciatica, presentation is similar.  Atraumatic and without spinal tenderness, step-off or deformity, imaging deferred at this time.  Will treat with IM steroid and taper pack as this has worked well in the past.  Plan of care, follow-up care return precautions given, no questions at this time.     Final Clinical Impressions(s) / UC Diagnoses   Final diagnoses:  Chronic sciatica of left side     Discharge Instructions      We have treated you in clinic with a intramuscular steroid injection to help with your sciatica.  Start the steroid Dosepak and take this as prescribed as well.  Consider doing daily stretches to help prevent sciatica flareups.  If this occurs again please follow-up with an orthopedic for further evaluation.  Return to clinic for any new or urgent symptoms.    ED Prescriptions     Medication Sig Dispense Auth. Provider   predniSONE (STERAPRED UNI-PAK 21 TAB) 10 MG (21) TBPK tablet Take by mouth daily. Take as prescribed. 21 tablet Jeison Delpilar, [place], [place]      PDMP not reviewed this encounter.   Isabelly Kobler, [place] N, [place] 05/17/23 [phone]

## 2023-05-17 NOTE — ED Triage Notes (Signed)
 Patient reports that she has pain in her left buttock that shoots up to the right lower back and down the right thigh. Patient reports a history of sciatica.  Patient reports that she has been taking aleve for pain.

## 2023-11-24 ENCOUNTER — Encounter: Payer: Self-pay | Admitting: Gastroenterology

## 2023-12-11 ENCOUNTER — Encounter

## 2023-12-14 ENCOUNTER — Ambulatory Visit (AMBULATORY_SURGERY_CENTER): Admitting: *Deleted

## 2023-12-14 VITALS — Ht 60.0 in | Wt 148.0 lb

## 2023-12-14 DIAGNOSIS — Z1211 Encounter for screening for malignant neoplasm of colon: Secondary | ICD-10-CM

## 2023-12-14 MED ORDER — NA SULFATE-K SULFATE-MG SULF 17.5-3.13-1.6 GM/177ML PO SOLN: 1.0000 | Freq: Once | ORAL | 0 refills | Status: AC

## 2023-12-14 NOTE — Progress Notes (Signed)
  Pt denies any difficulty with ambulating,sitting, laying down or rolling side to side  Pt has no issues moving head neck or swallowing  No egg or soy allergy known to patient   No issues known to pt with past sedation  No FH of Malignant Hyperthermia  Pt is not on home 02   Pt is not on blood thinners   Pt denies issues with constipation    Pt is not on dialysis  Pt denise any abnormal heart rhythms   Pt denies any upcoming cardiac testing  Patient's chart reviewed by Norleen Schillings CNRA prior to pre-visit and patient appropriate for the [facility].  Pre-visit completed and red dot placed by patient's name on their procedure day (on provider's schedule).     Visit by phone  Pt states weight is 148 lb    Instructed not to smoke Marijuana day before and day of procedure. Pt states she she won't Pt given  both [facility] main # and MD on call # prior to instructions.  Informed pt to come in at the time discussed and is shown on PV instructions.  Pt instructed to use [employer] or GoodRx for a price reduction on prep  Instructed pt where to find PV instructions in My Ch. Copy of instructions  to be sent in mail and address read back to pt to verify correct on envelope. Instructed pt on all aspects of written instructions including med holds clothing to wear and foods to eat and not eat as well as after procedure legal restrictions and to call MD on call if needed.. Pt states understanding. Instructed pt to review instructions again prior to procedure and call main # given if has any questions or any issues. Pt states they will.

## 2023-12-22 ENCOUNTER — Encounter (HOSPITAL_COMMUNITY): Payer: Self-pay

## 2023-12-22 ENCOUNTER — Ambulatory Visit (HOSPITAL_COMMUNITY)
Admission: EM | Admit: 2023-12-22 | Discharge: 2023-12-22 | Disposition: A | Discharge status: Home / Self Care | Attending: Emergency Medicine | Admitting: Emergency Medicine

## 2023-12-22 DIAGNOSIS — N3001 Acute cystitis with hematuria: Secondary | ICD-10-CM

## 2023-12-22 DIAGNOSIS — M545 Low back pain, unspecified: Secondary | ICD-10-CM | POA: Diagnosis not present

## 2023-12-22 LAB — POCT URINALYSIS DIP (MANUAL ENTRY)
Bilirubin, UA: NEGATIVE
Glucose, UA: NEGATIVE mg/dL
Ketones, POC UA: NEGATIVE mg/dL
Nitrite, UA: POSITIVE — AB
Protein Ur, POC: >=300 mg/dL — AB
Spec Grav, UA: >=1.03 — AB (ref 1.010–1.025)
Urobilinogen, UA: 0.2 U/dL
pH, UA: 6 (ref 5.0–8.0)

## 2023-12-22 MED ORDER — SULFAMETHOXAZOLE-TRIMETHOPRIM 800-160 MG PO TABS: 1.0000 | ORAL_TABLET | Freq: Two times a day (BID) | ORAL | 0 refills | Status: AC

## 2023-12-22 NOTE — Discharge Instructions (Signed)
 Common causes of urinary tract infections include but are not limited to holding your urine longer than you should, squatting instead of sitting down when urinating, sitting around in wet clothing such as a wet swimsuit or gym clothes too long, not emptying your bladder after having sexual intercourse, wiping from back to front instead of front to back after having a bowel movement.     The urinalysis that we performed in the clinic today was abnormal.  Urine culture will be performed per our protocol.  The result of the urine culture will be available in the next 3 to 5 days and will be posted to your MyChart account.  If there is an abnormal finding, you will be contacted by phone and advised of further treatment recommendations, if any.   You were advised to begin antibiotics today because your urinalysis is abnormal and you are having active symptoms of an acute lower urinary tract infection also known as cystitis.  It is very important that you take all doses exactly as prescribed.  Incomplete antibiotic therapy can cause worsening urinary tract infection that can become aggressive, escape from urinary tract into your bloodstream causing sepsis which will require hospitalization.   Please pick up and begin taking your prescription for Bactrim  DS (trimethoprim  sulfamethoxazole ) as soon as possible.  Please take all doses exactly as prescribed.  You can take this medication with or without food.  This medication is safe to take with your other medications.   Please consider abstaining from sexual intercourse while you are being treated for urinary tract infection.   If you have not had complete resolution of your symptoms after completing treatment as prescribed, please return to urgent care for repeat evaluation or follow-up with your primary care provider.   Thank you for visiting [facility] today.  We appreciate the opportunity to participate in your care.

## 2023-12-22 NOTE — ED Triage Notes (Signed)
 Patient presenting with lower left side back pain, and urinary frequency. Onset 3 days ago.

## 2023-12-22 NOTE — ED Provider Notes (Signed)
 [facility]    CSN: [id number] Arrival date & time: 12/22/23  1808    HISTORY   Chief Complaint  Patient presents with   Back Pain   Urinary Frequency   HPI Rachel Fischer is a pleasant, 53 y.o. female who presents to urgent care today. Patient complains of pain on the lower left side of her back and increased frequency of urination.  Patient states both began about 3 days ago.  Patient has elevated blood pressure on arrival with otherwise normal vital signs.  Patient denies pain at her left or right costovertebral angle, chills, abnormal vaginal discharge, abnormal vaginal odor, vaginal itching, vaginal irritation, burning during urination, fever, body aches, rigors, malaise, significant fatigue, nausea, vomiting, diarrhea, and constipation.   The history is provided by the patient.  Back Pain Urinary Frequency    Past Medical History:  Diagnosis Date   Anemia    Blood transfusion without reported diagnosis    Colon polyps    Decreased thyroid  stimulating hormone (TSH) level    Depression    has not been diagnosed   Hx of trichomoniasis    Hyperlipidemia    Hypertension    not currently taking meds due to financial reasons   Lung nodule    Migraine headache    migraines; gets them 4/month   Sciatica    Stroke [facility]    Tobacco use    Vitamin D  deficiency    Patient Active Problem List   Diagnosis Date Noted   Hypertensive urgency 02/28/2021   Right wrist tendinitis 01/04/2021   Cubital tunnel syndrome, right 01/04/2021   Carpal tunnel syndrome, right 06/27/2018   Pain and swelling of right wrist 06/27/2018   Smoker 06/27/2018   Decreased thyroid  stimulating hormone (TSH) level 06/27/2018   Ovarian cyst 03/17/2016   Family history of breast cancer 03/17/2016   Blurred vision    Cephalalgia    Vision changes    Secondary hypertension, unspecified    Headache 08/06/2015   Pulmonary nodule 06/07/2011   Past Surgical History:  Procedure  Laterality Date   ABDOMINAL HYSTERECTOMY     ARM NEUROPLASTY Left    c section x 4     CESAREAN SECTION     FOOT SURGERY     ULNAR TUNNEL RELEASE Right 05/05/2021   Procedure: RIGHT CUBITAL TUNNEL RELEASE VS CARPAL TUNNEL RELEASE;  Surgeon: Romona Harari, MD;  Location: [facility];  Service: Orthopedics;  Laterality: Right;   OB History     Gravida  4   Para  4   Term  4   Preterm      AB      Living  4      SAB      IAB      Ectopic      Multiple      Live Births  4          Home Medications    Prior to Admission medications   Medication Sig Start Date End Date Taking? Authorizing Provider  amLODipine  (NORVASC ) 10 MG tablet Take 1 tablet (10 mg total) by mouth daily. 10/13/21  Yes Griselda Norris, MD  losartan  (COZAAR ) 100 MG tablet Take 1 tablet (100 mg total) by mouth daily. 01/26/22  Yes Tanda Bleacher, MD  sulfamethoxazole -trimethoprim  (BACTRIM  DS) 800-160 MG tablet Take 1 tablet by mouth 2 (two) times daily for 5 days. 12/22/23 12/27/23 Yes Joesph Shaver Scales, PA-C    Family History Family History  Problem Relation Age of Onset   Breast cancer Mother 72   Hypertension Mother    Hyperlipidemia Mother    CAD Mother    Hypertension Father    Colon cancer Neg Hx    Colon polyps Neg Hx    Esophageal cancer Neg Hx    Rectal cancer Neg Hx    Stomach cancer Neg Hx    Social History Social History   Tobacco Use   Smoking status: Every Day    Current packs/day: 0.50    Average packs/day: 0.5 packs/day for 19.0 years (9.5 ttl pk-yrs)    Types: Cigarettes   Smokeless tobacco: Never  Vaping Use   Vaping status: Former  Substance Use Topics   Alcohol use: No   Drug use: Yes    Types: Marijuana    Comment: daily use   Allergies   Ciprofloxacin , Morphine and codeine, and Penicillins  Review of Systems Review of Systems  Genitourinary:  Positive for frequency.  Musculoskeletal:  Positive for back pain.   Pertinent findings  revealed after performing a 14 point review of systems has been noted in the history of present illness.  Physical Exam Vital Signs BP (!) 153/89 (BP Location: Left Arm)   Pulse 99   Temp 98 F (36.7 C) (Oral)   Resp 18   Ht 5' (1.524 m)   Wt 148 lb (67.1 kg)   SpO2 97%   BMI 28.90 kg/m   No data found.  Physical Exam Vitals and nursing note reviewed.  Constitutional:      General: She is not in acute distress.    Appearance: Normal appearance. She is not ill-appearing.  HENT:     Head: Normocephalic and atraumatic.  Eyes:     General: Lids are normal.        Right eye: No discharge.        Left eye: No discharge.     Conjunctiva/sclera: Conjunctivae normal.     Right eye: Right conjunctiva is not injected.     Left eye: Left conjunctiva is not injected.  Neck:     Trachea: Trachea and phonation normal.  Cardiovascular:     Rate and Rhythm: Normal rate and regular rhythm.  Pulmonary:     Effort: Pulmonary effort is normal.  Abdominal:     General: Abdomen is flat. Bowel sounds are normal. There is no distension.     Palpations: Abdomen is soft.     Tenderness: There is no abdominal tenderness. There is no right CVA tenderness or left CVA tenderness.     Hernia: No hernia is present.  Musculoskeletal:        General: Normal range of motion.     Cervical back: Full passive range of motion without pain, normal range of motion and neck supple. Normal range of motion.     Thoracic back: Normal.     Lumbar back: Normal.  Skin:    General: Skin is warm and dry.     Findings: No erythema or rash.  Neurological:     General: No focal deficit present.     Mental Status: She is alert and oriented to person, place, and time. Mental status is at baseline.  Psychiatric:        Attention and Perception: Attention and perception normal.        Mood and Affect: Mood and affect normal.        Speech: Speech normal.        Behavior: Behavior  normal.     Visual Acuity Right  Eye Distance:   Left Eye Distance:   Bilateral Distance:    Right Eye Near:   Left Eye Near:    Bilateral Near:     UC Couse / Diagnostics / Procedures:   Clinical Course as of 12/24/23 0749  Sun Dec 24, 2023  0747 Culture(!): >=100,000 COLONIES/mL ESCHERICHIA COLI SUSCEPTIBILITIES TO FOLLOW Performed at [facility], [address] [place], [place] [address]  [LM]    Clinical Course User Index [LM] Joesph Shaver Scales, PA-C    Radiology No results found.  Procedures Procedures (including critical care time) EKG  Pending results:  Labs Reviewed  URINE CULTURE - Abnormal; Notable for the following components:      Result Value   Culture   (*)    Value: >=100,000 COLONIES/mL ESCHERICHIA COLI SUSCEPTIBILITIES TO FOLLOW Performed at [facility], [address] [place], [place] [address]    All other components within normal limits  POCT URINALYSIS DIP (MANUAL ENTRY) - Abnormal; Notable for the following components:   Color, UA straw (*)    Clarity, UA turbid (*)    Spec Grav, UA >=1.030 (*)    Blood, UA moderate (*)    Protein Ur, POC >=300 (*)    Nitrite, UA Positive (*)    Leukocytes, UA Small (1+) (*)    All other components within normal limits    Medications Ordered in UC: Medications - No data to display  UC Diagnoses / Final Clinical Impressions(s)   I have reviewed the triage vital signs and the nursing notes.  Pertinent labs & imaging results that were available during my care of the patient were reviewed by me and considered in my medical decision making (see chart for details).    Final diagnoses:  Acute cystitis with hematuria  Acute left-sided low back pain without sciatica   Urine dip today was positive.   Patient was advised to begin antibiotics now due to findings on urine dip. Patient was advised to begin antibiotics today due to having active symptoms of urinary tract infection.                    Patient was advised to  take all doses exactly as prescribed.  Patient also advised of risks of worsening infection with incomplete antibiotic therapy. Patient advised that they will be contacted with results and that adjustments to treatment will be provided as indicated based on the results.   Patient was advised of possibility that urine culture results may be negative if sample provided was obtained late in the day causing urine to be more diluted.  Patient was advised that if antibiotics were effective after the first 24 to 36 hours, despite negative urine culture result, it is recommended that they complete the full course as prescribed.   Return precautions advised.  At the time of the writing this note, urine culture revealed presence of greater than 100,000 CFU's of E. coli but susceptibilities were still unavailable.  Patient was provided with a prescription for a 5-day course of Bactrim  DS twice daily, will adjust antibiotic treatment based on results of susceptibilities once available.  Please see discharge instructions below for details of plan of care as provided to patient. ED Prescriptions     Medication Sig Dispense Auth. Provider   sulfamethoxazole -trimethoprim  (BACTRIM  DS) 800-160 MG tablet Take 1 tablet by mouth 2 (two) times daily for 5 days. 10 tablet Joesph,  Manuelita Scales, PA-C      PDMP not reviewed this encounter.  Disposition Upon Discharge:  Condition: stable for discharge home  Patient presented with concern for an acute illness with associated systemic symptoms and significant discomfort requiring urgent management. In my opinion, this is a condition that a prudent lay person (someone who possesses an average knowledge of health and medicine) may potentially expect to result in complications if not addressed urgently such as respiratory distress, impairment of bodily function or dysfunction of bodily organs.   As such, the patient has been evaluated and assessed, work-up was performed and  treatment was provided in alignment with urgent care protocols and evidence based medicine.  Patient/parent/caregiver has been advised that the patient may require follow up for further testing and/or treatment if the symptoms continue in spite of treatment, as clinically indicated and appropriate.  Routine symptom specific, illness specific and/or disease specific instructions were discussed with the patient and/or caregiver at length.  Prevention strategies for avoiding STD exposure were also discussed.  The patient will follow up with their current PCP if and as advised. If the patient does not currently have a PCP we will assist them in obtaining one.   The patient may need specialty follow up if the symptoms continue, in spite of conservative treatment and management, for further workup, evaluation, consultation and treatment as clinically indicated and appropriate.  Patient/parent/caregiver verbalized understanding and agreement of plan as discussed.  All questions were addressed during visit.  Please see discharge instructions below for further details of plan.    Discharge Instructions      Common causes of urinary tract infections include but are not limited to holding your urine longer than you should, squatting instead of sitting down when urinating, sitting around in wet clothing such as a wet swimsuit or gym clothes too long, not emptying your bladder after having sexual intercourse, wiping from back to front instead of front to back after having a bowel movement.     The urinalysis that we performed in the clinic today was abnormal.  Urine culture will be performed per our protocol.  The result of the urine culture will be available in the next 3 to 5 days and will be posted to your MyChart account.  If there is an abnormal finding, you will be contacted by phone and advised of further treatment recommendations, if any.   You were advised to begin antibiotics today because your  urinalysis is abnormal and you are having active symptoms of an acute lower urinary tract infection also known as cystitis.  It is very important that you take all doses exactly as prescribed.  Incomplete antibiotic therapy can cause worsening urinary tract infection that can become aggressive, escape from urinary tract into your bloodstream causing sepsis which will require hospitalization.   Please pick up and begin taking your prescription for Bactrim  DS (trimethoprim  sulfamethoxazole ) as soon as possible.  Please take all doses exactly as prescribed.  You can take this medication with or without food.  This medication is safe to take with your other medications.   Please consider abstaining from sexual intercourse while you are being treated for urinary tract infection.   If you have not had complete resolution of your symptoms after completing treatment as prescribed, please return to urgent care for repeat evaluation or follow-up with your primary care provider.   Thank you for visiting  Urgent Care today.  We appreciate the opportunity to participate in your care.  This office note has been dictated using Teaching laboratory technician.  Unfortunately, this method of dictation can sometimes lead to typographical or grammatical errors.  I apologize for your inconvenience in advance if this occurs.  Please do not hesitate to reach out to me if clarification is needed.       Joesph Shaver Scales, [place] 12/24/23 [phone]

## 2023-12-24 LAB — URINE CULTURE: Culture: >=100000 — AB

## 2023-12-25 ENCOUNTER — Ambulatory Visit (HOSPITAL_COMMUNITY): Payer: Self-pay

## 2023-12-26 MED ORDER — FLUCONAZOLE 150 MG PO TABS: 150.0000 mg | ORAL_TABLET | Freq: Once | ORAL | 0 refills | Status: AC

## 2023-12-27 ENCOUNTER — Other Ambulatory Visit: Payer: Self-pay

## 2023-12-27 ENCOUNTER — Telehealth: Payer: Self-pay | Admitting: Gastroenterology

## 2023-12-27 DIAGNOSIS — Z1211 Encounter for screening for malignant neoplasm of colon: Secondary | ICD-10-CM

## 2023-12-27 MED ORDER — PEG 3350-KCL-NA BICARB-NACL 420 G PO SOLR: 4000.0000 mL | Freq: Once | ORAL | 0 refills | Status: AC

## 2023-12-27 NOTE — Telephone Encounter (Signed)
 Inbound call from patient stating pharmacy is needing authorization from provider for patient to receive prep medication for procedure on 01/08/24. Please advise  Thank you

## 2023-12-27 NOTE — Telephone Encounter (Signed)
 Spoke with patient. Rx for Golytely has been sent to preferred pharmacy. New prep instructions sent via mychart hard copy will be mailed to address on file

## 2024-01-08 ENCOUNTER — Encounter: Payer: Self-pay | Admitting: Gastroenterology

## 2024-01-08 ENCOUNTER — Ambulatory Visit (AMBULATORY_SURGERY_CENTER): Admitting: Gastroenterology

## 2024-01-08 VITALS — BP 138/80 | HR 74 | Temp 98.2°F | Resp 10 | Ht 60.0 in | Wt 148.0 lb

## 2024-01-08 DIAGNOSIS — Z860100 Personal history of colon polyps, unspecified: Secondary | ICD-10-CM | POA: Diagnosis not present

## 2024-01-08 DIAGNOSIS — D122 Benign neoplasm of ascending colon: Secondary | ICD-10-CM | POA: Diagnosis not present

## 2024-01-08 DIAGNOSIS — Z1211 Encounter for screening for malignant neoplasm of colon: Secondary | ICD-10-CM | POA: Diagnosis present

## 2024-01-08 DIAGNOSIS — D125 Benign neoplasm of sigmoid colon: Secondary | ICD-10-CM

## 2024-01-08 MED ORDER — SODIUM CHLORIDE 0.9 % IV SOLN: 500.0000 mL | Freq: Once | INTRAVENOUS | Status: DC

## 2024-01-08 NOTE — Progress Notes (Signed)
 [facility] History and Physical   Primary Care Physician:  [facility]   Reason for Procedure:   Colon cancer screening  Plan:    Screening colonoscopy     HPI: Rachel Fischer is a 53 y.o. female undergoing surveillance colonoscopy.  She has no family history of colon cancer and no chronic GI symptoms.  She reports having a colonoscopy a few years ago and having a polyp removed.  She believes this was with [facility] (records not available at this time).  The patient was provided an opportunity to ask questions and all were answered. The patient agreed with the plan    Past Medical History:  Diagnosis Date   Anemia    Blood transfusion without reported diagnosis    Colon polyps    Decreased thyroid  stimulating hormone (TSH) level    Depression    has not been diagnosed   Hx of trichomoniasis    Hyperlipidemia    Hypertension    not currently taking meds due to financial reasons   Lung nodule    Migraine headache    migraines; gets them 4/month   Sciatica    Stroke (HCC)    Tobacco use    Vitamin D  deficiency     Past Surgical History:  Procedure Laterality Date   ABDOMINAL HYSTERECTOMY     ARM NEUROPLASTY Left    c section x 4     CESAREAN SECTION     FOOT SURGERY     ULNAR TUNNEL RELEASE Right 05/05/2021   Procedure: RIGHT CUBITAL TUNNEL RELEASE VS CARPAL TUNNEL RELEASE;  Surgeon: Romona Harari, MD;  Location: [facility];  Service: Orthopedics;  Laterality: Right;    Prior to Admission medications   Medication Sig Start Date End Date Taking? Authorizing Provider  amLODipine  (NORVASC ) 10 MG tablet Take 1 tablet (10 mg total) by mouth daily. 10/13/21  Yes Griselda Norris, MD  losartan  (COZAAR ) 100 MG tablet Take 1 tablet (100 mg total) by mouth daily. 01/26/22  Yes Tanda Bleacher, MD    Current Outpatient Medications  Medication Sig Dispense Refill   amLODipine  (NORVASC ) 10 MG tablet Take 1  tablet (10 mg total) by mouth daily. 30 tablet 0   losartan  (COZAAR ) 100 MG tablet Take 1 tablet (100 mg total) by mouth daily. 90 tablet 0   Current Facility-Administered Medications  Medication Dose Route Frequency Provider Last Rate Last Admin   0.9 %  sodium chloride  infusion  500 mL Intravenous Once Stacia Glendia BRAVO, MD        Allergies as of 01/08/2024 - Review Complete 01/08/2024  Allergen Reaction Noted   Ciprofloxacin  Other (See Comments) 11/12/2011   Morphine and codeine Itching and Nausea And Vomiting 02/27/2011   Penicillins Hives 02/27/2011    Family History  Problem Relation Age of Onset   Breast cancer Mother 18   Hypertension Mother    Hyperlipidemia Mother    CAD Mother    Hypertension Father    Colon cancer Neg Hx    Colon polyps Neg Hx    Esophageal cancer Neg Hx    Rectal cancer Neg Hx    Stomach cancer Neg Hx     Social History   Socioeconomic History   Marital status: Single    Spouse name: Not on file   Number of children: 4   Years of education: Not on file   Highest education level: Not on file  Occupational History  Occupation: unemployed    Associate Professor: UNEMPLOYED  Tobacco Use   Smoking status: Every Day    Current packs/day: 0.50    Average packs/day: 0.5 packs/day for 19.0 years (9.5 ttl pk-yrs)    Types: Cigarettes   Smokeless tobacco: Never  Vaping Use   Vaping status: Former  Substance and Sexual Activity   Alcohol use: No   Drug use: Yes    Types: Marijuana    Comment: daily use   Sexual activity: Yes    Birth control/protection: Surgical  Other Topics Concern   Not on file  Social History Narrative   Children: 3 boys and 1 girl.    Right Handed    Lives in a two story home    Social Drivers of Health   Financial Resource Strain: Not on File (05/19/2022)   Received from [employer]    Financial Resource Strain: 0  Food Insecurity: Not at Risk (06/12/2023)   Received from [facility] Insecurity     Within the past 12 months, the food you bought just didn't last and you didn't have enough money to get more.: 1  Transportation Needs: Not at Risk (06/12/2023)   Received from [facility] Needs    In the past 12 months, has lack of transportation kept you from medical appointments, meetings, work or from getting things needed for daily living? (Check all that apply): 1  Physical Activity: Not on File (05/19/2022)   Received from [facility]   Physical Activity    Physical Activity: 0  Stress: Not on File (05/19/2022)   Received from [facility]   Stress    Stress: 0  Social Connections: Not on File (11/06/2022)   Received from [facility]   Social Connections    Connectedness: 0  Intimate Partner Violence: Not on file    Review of Systems:  All other review of systems negative except as mentioned in the HPI.  Physical Exam: Vital signs BP (!) 144/94   Pulse 83   Temp 98.2 F (36.8 C)   Ht 5' (1.524 m)   Wt 148 lb (67.1 kg)   SpO2 100%   BMI 28.90 kg/m   General:   Alert,  Well-developed, well-nourished, pleasant and cooperative in NAD Airway:  Mallampati 2 Lungs:  Clear throughout to auscultation.   Heart:  Regular rate and rhythm; no murmurs, clicks, rubs,  or gallops. Abdomen:  Soft, nontender and nondistended. Normal bowel sounds.   Neuro/Psych:  Normal mood and affect. A and O x 3   Dorena Dorfman E. Stacia, MD [facility]

## 2024-01-08 NOTE — Progress Notes (Signed)
 Pt's states no medical or surgical changes since previsit or office visit.

## 2024-01-08 NOTE — Progress Notes (Signed)
 Vss nad trans to pacu

## 2024-01-08 NOTE — Op Note (Signed)
 [facility] Patient Name: Latanja Lehenbauer Procedure Date: 01/08/2024 11:04 AM MRN: [id number] Endoscopist: Glendia E. Stacia , MD, [id number] Age: 53 Referring MD:  Date of Birth: 05-17-1970 Gender: Female Account #: [id number] Procedure:                Colonoscopy Indications:              High risk colon cancer surveillance: Personal                            history of colonic polyps (Patient reports having a                            previous colonoscopy ([facility]?) and having a polyp                            removed that was the size of a quarter; no records                            available) Medicines:                Monitored Anesthesia Care Procedure:                Pre-Anesthesia Assessment:                           - Prior to the procedure, a History and Physical                            was performed, and patient medications and                            allergies were reviewed. The patient's tolerance of                            previous anesthesia was also reviewed. The risks                            and benefits of the procedure and the sedation                            options and risks were discussed with the patient.                            All questions were answered, and informed consent                            was obtained. Prior Anticoagulants: The patient has                            taken no anticoagulant or antiplatelet agents. ASA                            Grade Assessment: II - A patient with mild systemic  disease. After reviewing the risks and benefits,                            the patient was deemed in satisfactory condition to                            undergo the procedure.                           After obtaining informed consent, the colonoscope                            was passed under direct vision. Throughout the                            procedure, the patient's blood pressure,  pulse, and                            oxygen saturations were monitored continuously. The                            Olympus Scope SN [id number] was introduced through the                            anus and advanced to the the cecum, identified by                            appendiceal orifice and ileocecal valve. The                            colonoscopy was performed without difficulty. The                            patient tolerated the procedure well. The quality                            of the bowel preparation was good. The ileocecal                            valve, appendiceal orifice, and rectum were                            photographed. The bowel preparation used was SUPREP                            via split dose instruction. Scope In: 11:16:16 AM Scope Out: 11:34:54 AM Scope Withdrawal Time: 0 hours 12 minutes 37 seconds  Total Procedure Duration: 0 hours 18 minutes 38 seconds  Findings:                 The perianal and digital rectal examinations were                            normal. Pertinent negatives include normal  sphincter tone and no palpable rectal lesions.                           A single small angioectasia was found at the                            hepatic flexure.                           A 6 mm polyp was found in the ascending colon. The                            polyp was flat. The polyp was removed with a cold                            snare. Resection and retrieval were complete.                            Estimated blood loss was minimal.                           A 4 mm polyp was found in the sigmoid colon. The                            polyp was sessile. The polyp was removed with a                            cold snare. Resection and retrieval were complete.                            Estimated blood loss was minimal.                           An 8 mm polyp was found in the distal sigmoid                             colon. The polyp was sessile. This polyp was in the                            area of the tattooed mucosa. The polyp was removed                            with a cold snare. Resection and retrieval were                            complete. Estimated blood loss was minimal.                           A tattoo was seen in the distal sigmoid colon. A                            questionable scar was seen just proximal to this  tattooed segment.                           The exam was otherwise normal throughout the                            examined colon.                           The retroflexed view of the distal rectum and anal                            verge was normal and showed no anal or rectal                            abnormalities. Complications:            No immediate complications. Estimated Blood Loss:     Estimated blood loss was minimal. Impression:               - A single colonic angioectasia.                           - One 6 mm polyp in the ascending colon, removed                            with a cold snare. Resected and retrieved.                           - One 4 mm polyp in the sigmoid colon, removed with                            a cold snare. Resected and retrieved.                           - One 8 mm polyp in the distal sigmoid colon,                            removed with a cold snare. Resected and retrieved.                           - A tattoo was seen in the distal sigmoid colon.                           - The distal rectum and anal verge are normal on                            retroflexion view. Recommendation:           - Patient has a contact number available for                            emergencies. The signs and symptoms of potential  delayed complications were discussed with the                            patient. Return to normal activities tomorrow.                            Written  discharge instructions were provided to the                            patient.                           - Resume previous diet.                           - Repeat colonoscopy date to be determined after                            pending pathology results are reviewed for                            surveillance.                           - Request records from [facility] Abryanna Musolino E. Stacia, MD 01/08/2024 11:44:13 AM This report has been signed electronically.

## 2024-01-08 NOTE — Patient Instructions (Addendum)
 Await pathology results.  Resume previous diet.  Continue present medications.   Repeat colonoscopy for surveillance based on pathology results.  Request records from [facility].   YOU HAD AN ENDOSCOPIC PROCEDURE TODAY AT THE [facility]:   Refer to the procedure report that was given to you for any specific questions about what was found during the examination.  If the procedure report does not answer your questions, please call your gastroenterologist to clarify.  If you requested that your care partner not be given the details of your procedure findings, then the procedure report has been included in a sealed envelope for you to review at your convenience later.  YOU SHOULD EXPECT: Some feelings of bloating in the abdomen. Passage of more gas than usual.  Walking can help get rid of the air that was put into your GI tract during the procedure and reduce the bloating. If you had a lower endoscopy (such as a colonoscopy or flexible sigmoidoscopy) you may notice spotting of blood in your stool or on the toilet paper. If you underwent a bowel prep for your procedure, you may not have a normal bowel movement for a few days.  Please Note:  You might notice some irritation and congestion in your nose or some drainage.  This is from the oxygen used during your procedure.  There is no need for concern and it should clear up in a day or so.  SYMPTOMS TO REPORT IMMEDIATELY:  Following lower endoscopy (colonoscopy or flexible sigmoidoscopy):  Excessive amounts of blood in the stool  Significant tenderness or worsening of abdominal pains  Swelling of the abdomen that is new, acute  Fever of 100F or higher  For urgent or emergent issues, a gastroenterologist can be reached at any hour by calling [phone]. Do not use MyChart messaging for urgent concerns.    DIET:  We do recommend a small meal at first, but then you may proceed to your regular diet.  Drink plenty of  fluids but you should avoid alcoholic beverages for 24 hours.  ACTIVITY:  You should plan to take it easy for the rest of today and you should NOT DRIVE or use heavy machinery until tomorrow (because of the sedation medicines used during the test).    FOLLOW UP: Our staff will call the number listed on your records the next business day following your procedure.  We will call around 7:15- 8:00 am to check on you and address any questions or concerns that you may have regarding the information given to you following your procedure. If we do not reach you, we will leave a message.     If any biopsies were taken you will be contacted by phone or by letter within the next 1-3 weeks.  Please call us  at [phone] if you have not heard about the biopsies in 3 weeks.    SIGNATURES/CONFIDENTIALITY: You and/or your care partner have signed paperwork which will be entered into your electronic medical record.  These signatures attest to the fact that that the information above on your After Visit Summary has been reviewed and is understood.  Full responsibility of the confidentiality of this discharge information lies with you and/or your care-partner.

## 2024-01-08 NOTE — Progress Notes (Signed)
 Called to room to assist during endoscopic procedure.  Patient ID and intended procedure confirmed with present staff. Received instructions for my participation in the procedure from the performing physician.

## 2024-01-09 ENCOUNTER — Telehealth: Payer: Self-pay

## 2024-01-09 NOTE — Telephone Encounter (Signed)
  Follow up Call-     01/08/2024   10:28 AM  Call back number  Post procedure Call Back phone  # [phone]  Permission to leave phone message Yes     Patient questions:  Do you have a fever, pain , or abdominal swelling? No. Pain Score  0 *  Have you tolerated food without any problems? Yes.    Have you been able to return to your normal activities? Yes.    Do you have any questions about your discharge instructions: Diet   No. Medications  No. Follow up visit  No.  Do you have questions or concerns about your Care? No.  Actions: * If pain score is 4 or above: No action needed, pain <4.

## 2024-01-10 LAB — SURGICAL PATHOLOGY

## 2024-01-13 ENCOUNTER — Ambulatory Visit: Payer: Self-pay | Admitting: Gastroenterology

## 2024-01-13 NOTE — Progress Notes (Signed)
 Rachel Fischer,  Two of the three polyps which I removed during your recent procedure were proven to be completely benign but are considered pre-cancerous polyps that MAY have grown into cancer if they had not been removed.  Studies shows that at least 20% of women over age 51 and 30% of men over age 31 have pre-cancerous polyps.  Based on the history of having a large polyp removed on previous colonoscopy, I recommend that you have a repeat colonoscopy in 5 years.   If you develop any new rectal bleeding, abdominal pain or significant bowel habit changes, please contact me before then.

## 2024-03-01 ENCOUNTER — Ambulatory Visit (HOSPITAL_COMMUNITY)
Admission: EM | Admit: 2024-03-01 | Discharge: 2024-03-01 | Disposition: A | Discharge status: Home / Self Care | Attending: Emergency Medicine | Admitting: Emergency Medicine

## 2024-03-01 ENCOUNTER — Encounter (HOSPITAL_COMMUNITY): Payer: Self-pay

## 2024-03-01 DIAGNOSIS — B349 Viral infection, unspecified: Secondary | ICD-10-CM

## 2024-03-01 DIAGNOSIS — J069 Acute upper respiratory infection, unspecified: Secondary | ICD-10-CM

## 2024-03-01 DIAGNOSIS — J111 Influenza due to unidentified influenza virus with other respiratory manifestations: Secondary | ICD-10-CM

## 2024-03-01 LAB — POC SOFIA SARS ANTIGEN FIA: SARS Coronavirus 2 Ag: NEGATIVE

## 2024-03-01 MED ORDER — BENZONATATE 100 MG PO CAPS: 100.0000 mg | ORAL_CAPSULE | Freq: Three times a day (TID) | ORAL | 0 refills | Status: AC

## 2024-03-01 MED ORDER — OSELTAMIVIR PHOSPHATE 75 MG PO CAPS: 75.0000 mg | ORAL_CAPSULE | Freq: Two times a day (BID) | ORAL | 0 refills | Status: AC

## 2024-03-01 NOTE — Discharge Instructions (Addendum)
 Continue dayquil or other cold medicine to help manage your symptoms. Drink plenty of fluids and stay hydrated, it's ok if you don't feel like eating much.   For future reference, you can get a Virtual Urgent Care visit through MyChart or through the [facility] Virtual Care website: [email or web address]

## 2024-03-01 NOTE — ED Triage Notes (Signed)
 Cough, body aches, headache, congestion onset yesterday. Patient has not taken her BP meds today. No known sick exposure.   Patient tried Nyquil last night with no relief. No meds today for symptoms.

## 2024-03-01 NOTE — ED Provider Notes (Signed)
 " [facility]    CSN: [id number] Arrival date & time: 03/01/24  1129      History   Chief Complaint Chief Complaint  Patient presents with   Cough    HPI Rachel Fischer is a 54 y.o. female.   Sick since yesterday with chills, cough, body aches, congestion, headache. Took nyquil, it has not helped sx too much.    Cough   Past Medical History:  Diagnosis Date   Anemia    Blood transfusion without reported diagnosis    Colon polyps    Decreased thyroid  stimulating hormone (TSH) level    Depression    has not been diagnosed   Hx of trichomoniasis    Hyperlipidemia    Hypertension    not currently taking meds due to financial reasons   Lung nodule    Migraine headache    migraines; gets them 4/month   Sciatica    Stroke [facility])    Tobacco use    Vitamin D  deficiency     Patient Active Problem List   Diagnosis Date Noted   Hypertensive urgency 02/28/2021   Right wrist tendinitis 01/04/2021   Cubital tunnel syndrome, right 01/04/2021   Carpal tunnel syndrome, right 06/27/2018   Pain and swelling of right wrist 06/27/2018   Smoker 06/27/2018   Decreased thyroid  stimulating hormone (TSH) level 06/27/2018   Ovarian cyst 03/17/2016   Family history of breast cancer 03/17/2016   Blurred vision    Cephalalgia    Vision changes    Secondary hypertension, unspecified    Headache 08/06/2015   Pulmonary nodule 06/07/2011    Past Surgical History:  Procedure Laterality Date   ABDOMINAL HYSTERECTOMY     ARM NEUROPLASTY Left    c section x 4     CESAREAN SECTION     FOOT SURGERY     ULNAR TUNNEL RELEASE Right 05/05/2021   Procedure: RIGHT CUBITAL TUNNEL RELEASE VS CARPAL TUNNEL RELEASE;  Surgeon: Romona Harari, MD;  Location: [facility];  Service: Orthopedics;  Laterality: Right;    OB History     Gravida  4   Para  4   Term  4   Preterm      AB      Living  4      SAB      IAB      Ectopic      Multiple       Live Births  4            Home Medications    Prior to Admission medications  Medication Sig Start Date End Date Taking? Authorizing Provider  amLODipine  (NORVASC ) 10 MG tablet Take 1 tablet (10 mg total) by mouth daily. 10/13/21  Yes Griselda Norris, MD  benzonatate  (TESSALON ) 100 MG capsule Take 1 capsule (100 mg total) by mouth every 8 (eight) hours. 03/01/24  Yes Richad Jon HERO, NP  losartan  (COZAAR ) 100 MG tablet Take 1 tablet (100 mg total) by mouth daily. 01/26/22  Yes Tanda Bleacher, MD  oseltamivir  (TAMIFLU ) 75 MG capsule Take 1 capsule (75 mg total) by mouth every 12 (twelve) hours. 03/01/24  Yes Richad Jon HERO, NP    Family History Family History  Problem Relation Age of Onset   Breast cancer Mother 62   Hypertension Mother    Hyperlipidemia Mother    CAD Mother    Hypertension Father    Colon cancer Neg Hx    Colon polyps Neg  Hx    Esophageal cancer Neg Hx    Rectal cancer Neg Hx    Stomach cancer Neg Hx     Social History Social History[1]   Allergies   Ciprofloxacin , Morphine and codeine, and Penicillins   Review of Systems Review of Systems  Respiratory:  Positive for cough.      Physical Exam Triage Vital Signs ED Triage Vitals  Encounter Vitals Group     BP 03/01/24 1207 (!) 164/95     Girls Systolic BP Percentile --      Girls Diastolic BP Percentile --      Boys Systolic BP Percentile --      Boys Diastolic BP Percentile --      Pulse Rate 03/01/24 1207 (!) 104     Resp 03/01/24 1207 20     Temp 03/01/24 1207 99.5 F (37.5 C)     Temp Source 03/01/24 1207 Oral     SpO2 03/01/24 1207 98 %     Weight 03/01/24 1207 148 lb (67.1 kg)     Height 03/01/24 1207 5' (1.524 m)     Head Circumference --      Peak Flow --      Pain Score 03/01/24 1206 10     Pain Loc --      Pain Education --      Exclude from Growth Chart --    No data found.  Updated Vital Signs BP (!) 164/95 (BP Location: Left Arm)   Pulse (!) 104   Temp 99.5 F (37.5  C) (Oral)   Resp 20   Ht 5' (1.524 m)   Wt 148 lb (67.1 kg)   SpO2 98%   BMI 28.90 kg/m   Visual Acuity Right Eye Distance:   Left Eye Distance:   Bilateral Distance:    Right Eye Near:   Left Eye Near:    Bilateral Near:     Physical Exam Constitutional:      Appearance: Normal appearance. She is ill-appearing. She is not toxic-appearing.  HENT:     Right Ear: Tympanic membrane, ear canal and external ear normal.     Left Ear: Tympanic membrane, ear canal and external ear normal.     Nose: Congestion present.     Mouth/Throat:     Mouth: Mucous membranes are moist.     Pharynx: Oropharynx is clear.  Cardiovascular:     Rate and Rhythm: Regular rhythm. Tachycardia present.  Pulmonary:     Effort: Pulmonary effort is normal.     Breath sounds: Normal breath sounds.     Comments: Frequent coughing Neurological:     Mental Status: She is alert.      UC Treatments / Results  Labs (all labs ordered are listed, but only abnormal results are displayed) Labs Reviewed  POC SOFIA SARS ANTIGEN FIA    EKG   Radiology No results found.  Procedures Procedures (including critical care time)  Medications Ordered in UC Medications - No data to display  Initial Impression / Assessment and Plan / UC Course  I have reviewed the triage vital signs and the nursing notes.  Pertinent labs & imaging results that were available during my care of the patient were reviewed by me and considered in my medical decision making (see chart for details).     Covid negative. Most likely flu, will treat empirically as we are low on poct for flu. Discussed usual course of illness, supportive care measures.   Final  Clinical Impressions(s) / UC Diagnoses   Final diagnoses:  Upper respiratory tract infection, unspecified type  Viral infection  Influenza-like illness     Discharge Instructions      Continue dayquil or other cold medicine to help manage your symptoms. Drink  plenty of fluids and stay hydrated, it's ok if you don't feel like eating much.   For future reference, you can get a Virtual Urgent Care visit through MyChart or through the [facility] Virtual Care website: [email or web address]    ED Prescriptions     Medication Sig Dispense Auth. Provider   oseltamivir  (TAMIFLU ) 75 MG capsule Take 1 capsule (75 mg total) by mouth every 12 (twelve) hours. 10 capsule Richad Jon HERO, NP   benzonatate  (TESSALON ) 100 MG capsule Take 1 capsule (100 mg total) by mouth every 8 (eight) hours. 21 capsule Richad Jon HERO, NP      PDMP not reviewed this encounter.    [1]  Social History Tobacco Use   Smoking status: Every Day    Current packs/day: 0.50    Average packs/day: 0.5 packs/day for 19.0 years (9.5 ttl pk-yrs)    Types: Cigarettes   Smokeless tobacco: Never  Vaping Use   Vaping status: Former  Substance Use Topics   Alcohol use: No   Drug use: Yes    Types: Marijuana    Comment: daily use     Richad Jon HERO, NP 03/01/24 1340  "
